# Patient Record
Sex: Male | Born: 1945 | Race: White | Hispanic: No | Smoking: Never smoker
Health system: Southern US, Community
[De-identification: ages and names within clinical notes are randomized; demographics above are authoritative.]

## PROBLEM LIST (undated history)

## (undated) DIAGNOSIS — R0600 Dyspnea, unspecified: Secondary | ICD-10-CM

## (undated) DIAGNOSIS — E785 Hyperlipidemia, unspecified: Secondary | ICD-10-CM

## (undated) DIAGNOSIS — F32A Depression, unspecified: Secondary | ICD-10-CM

## (undated) DIAGNOSIS — B0229 Other postherpetic nervous system involvement: Secondary | ICD-10-CM

## (undated) DIAGNOSIS — M5136 Other intervertebral disc degeneration, lumbar region: Secondary | ICD-10-CM

## (undated) DIAGNOSIS — R42 Dizziness and giddiness: Secondary | ICD-10-CM

## (undated) DIAGNOSIS — M1990 Unspecified osteoarthritis, unspecified site: Secondary | ICD-10-CM

## (undated) DIAGNOSIS — M51369 Other intervertebral disc degeneration, lumbar region without mention of lumbar back pain or lower extremity pain: Secondary | ICD-10-CM

## (undated) DIAGNOSIS — I2510 Atherosclerotic heart disease of native coronary artery without angina pectoris: Secondary | ICD-10-CM

## (undated) DIAGNOSIS — I10 Essential (primary) hypertension: Secondary | ICD-10-CM

## (undated) DIAGNOSIS — K219 Gastro-esophageal reflux disease without esophagitis: Secondary | ICD-10-CM

## (undated) HISTORY — PX: EYE SURGERY: SHX253

## (undated) HISTORY — PX: HERNIA REPAIR: SHX51

## (undated) HISTORY — PX: CATARACT EXTRACTION, BILATERAL: SHX1313

## (undated) HISTORY — PX: CARDIOVASCULAR STRESS TEST: SHX262

## (undated) HISTORY — PX: TONSILLECTOMY: SUR1361

## (undated) HISTORY — PX: ELBOW SURGERY: SHX618

---

## 2006-09-23 ENCOUNTER — Ambulatory Visit: Payer: Self-pay | Admitting: Family Medicine

## 2008-10-01 ENCOUNTER — Ambulatory Visit: Payer: Self-pay | Admitting: Otolaryngology

## 2011-04-25 ENCOUNTER — Ambulatory Visit: Payer: Self-pay | Admitting: Specialist

## 2011-05-17 ENCOUNTER — Ambulatory Visit: Payer: Self-pay | Admitting: Pain Medicine

## 2011-06-04 ENCOUNTER — Ambulatory Visit: Payer: Self-pay | Admitting: Pain Medicine

## 2011-06-07 ENCOUNTER — Ambulatory Visit: Payer: Self-pay | Admitting: Pain Medicine

## 2011-06-26 ENCOUNTER — Ambulatory Visit: Payer: Self-pay | Admitting: Pain Medicine

## 2011-06-28 ENCOUNTER — Ambulatory Visit: Payer: Self-pay | Admitting: Pain Medicine

## 2011-07-24 ENCOUNTER — Ambulatory Visit: Payer: Self-pay | Admitting: Pain Medicine

## 2011-07-26 ENCOUNTER — Ambulatory Visit: Payer: Self-pay | Admitting: Pain Medicine

## 2011-08-07 ENCOUNTER — Ambulatory Visit: Payer: Self-pay | Admitting: Pain Medicine

## 2011-08-23 ENCOUNTER — Ambulatory Visit: Payer: Self-pay | Admitting: Pain Medicine

## 2013-09-01 ENCOUNTER — Ambulatory Visit: Payer: Self-pay | Admitting: Family Medicine

## 2013-09-10 ENCOUNTER — Ambulatory Visit: Payer: Self-pay

## 2014-03-17 ENCOUNTER — Ambulatory Visit: Payer: Self-pay | Admitting: Specialist

## 2014-04-19 ENCOUNTER — Other Ambulatory Visit: Payer: Self-pay | Admitting: Neurosurgery

## 2014-04-19 DIAGNOSIS — M48061 Spinal stenosis, lumbar region without neurogenic claudication: Secondary | ICD-10-CM

## 2014-04-29 ENCOUNTER — Ambulatory Visit
Admission: RE | Admit: 2014-04-29 | Discharge: 2014-04-29 | Disposition: A | Discharge status: Home / Self Care | Payer: Medicare Other | Source: Ambulatory Visit | Attending: Neurosurgery | Admitting: Neurosurgery

## 2014-04-29 DIAGNOSIS — M48061 Spinal stenosis, lumbar region without neurogenic claudication: Secondary | ICD-10-CM

## 2014-04-29 MED ORDER — ONDANSETRON HCL 4 MG/2ML IJ SOLN
4.0000 mg | Freq: Once | INTRAMUSCULAR | Status: AC
  Administered 2014-04-29: 4 mg via INTRAMUSCULAR

## 2014-04-29 MED ORDER — MEPERIDINE HCL 100 MG/ML IJ SOLN
75.0000 mg | Freq: Once | INTRAMUSCULAR | Status: AC
  Administered 2014-04-29: 75 mg via INTRAMUSCULAR

## 2014-04-29 MED ORDER — IOHEXOL 180 MG/ML  SOLN
20.0000 mL | Freq: Once | INTRAMUSCULAR | Status: AC | PRN
  Administered 2014-04-29: 20 mL via INTRATHECAL

## 2014-04-29 MED ORDER — DIAZEPAM 5 MG PO TABS
5.0000 mg | ORAL_TABLET | Freq: Once | ORAL | Status: AC
  Administered 2014-04-29: 5 mg via ORAL

## 2014-04-29 NOTE — Progress Notes (Signed)
Pt has been off Tramadol for the past 2 days.

## 2014-04-29 NOTE — Discharge Instructions (Signed)
Myelogram Discharge Instructions  1. Go home and rest quietly for the next 24 hours.  It is important to lie flat for the next 24 hours.  Get up only to go to the restroom.  You may lie in the bed or on a couch on your back, your stomach, your left side or your right side.  You may have one pillow under your head.  You may have pillows between your knees while you are on your side or under your knees while you are on your back.  2. DO NOT drive today.  Recline the seat as far back as it will go, while still wearing your seat belt, on the way home.  3. You may get up to go to the bathroom as needed.  You may sit up for 10 minutes to eat.  You may resume your normal diet and medications unless otherwise indicated.  Drink plenty of extra fluids today and tomorrow.  4. The incidence of a spinal headache with nausea and/or vomiting is about 5% (one in 20 patients).  If you develop a headache, lie flat and drink plenty of fluids until the headache goes away.  Caffeinated beverages may be helpful.  If you develop severe nausea and vomiting or a headache that does not go away with flat bed rest, call [phone].  5. You may resume normal activities after your 24 hours of bed rest is over; however, do not exert yourself strongly or do any heavy lifting tomorrow.  6. Call your physician for a follow-up appointment.   You may resume Tramadol on Friday, April 30, 2014 after 2:00p.m.

## 2016-06-11 ENCOUNTER — Other Ambulatory Visit
Admission: RE | Admit: 2016-06-11 | Discharge: 2016-06-11 | Disposition: A | Discharge status: Home / Self Care | Payer: Medicare Other | Source: Ambulatory Visit | Attending: Orthopedic Surgery | Admitting: Orthopedic Surgery

## 2016-06-11 DIAGNOSIS — M25462 Effusion, left knee: Secondary | ICD-10-CM | POA: Insufficient documentation

## 2016-06-11 LAB — SYNOVIAL CELL COUNT + DIFF, W/ CRYSTALS
CRYSTALS FLUID: NONE SEEN
Eosinophils-Synovial: 1 %
Lymphocytes-Synovial Fld: 39 %
MONOCYTE-MACROPHAGE-SYNOVIAL FLUID: 54 %
Neutrophil, Synovial: 4 %
OTHER CELLS-SYN: 2
WBC, Synovial: 114 /mm3 (ref 0–200)

## 2016-06-15 LAB — BODY FLUID CULTURE: CULTURE: NO GROWTH

## 2016-07-03 ENCOUNTER — Other Ambulatory Visit: Payer: Medicare Other

## 2016-07-03 ENCOUNTER — Other Ambulatory Visit: Payer: Self-pay | Admitting: Orthopedic Surgery

## 2016-07-03 DIAGNOSIS — T1590XA Foreign body on external eye, part unspecified, unspecified eye, initial encounter: Secondary | ICD-10-CM

## 2016-07-03 DIAGNOSIS — M25462 Effusion, left knee: Secondary | ICD-10-CM

## 2016-07-12 ENCOUNTER — Ambulatory Visit: Payer: Medicare Other

## 2016-07-12 ENCOUNTER — Ambulatory Visit
Admission: RE | Admit: 2016-07-12 | Discharge: 2016-07-12 | Disposition: A | Discharge status: Home / Self Care | Payer: Medicare Other | Source: Ambulatory Visit | Attending: Orthopedic Surgery | Admitting: Orthopedic Surgery

## 2016-07-12 DIAGNOSIS — M84462A Pathological fracture, left tibia, initial encounter for fracture: Secondary | ICD-10-CM | POA: Diagnosis not present

## 2016-07-12 DIAGNOSIS — M25462 Effusion, left knee: Secondary | ICD-10-CM

## 2016-07-12 DIAGNOSIS — M1712 Unilateral primary osteoarthritis, left knee: Secondary | ICD-10-CM | POA: Diagnosis not present

## 2016-07-12 DIAGNOSIS — S83232A Complex tear of medial meniscus, current injury, left knee, initial encounter: Secondary | ICD-10-CM | POA: Diagnosis not present

## 2016-07-12 DIAGNOSIS — M948X6 Other specified disorders of cartilage, lower leg: Secondary | ICD-10-CM | POA: Insufficient documentation

## 2017-05-01 ENCOUNTER — Other Ambulatory Visit: Payer: Self-pay

## 2017-05-01 ENCOUNTER — Ambulatory Visit
Admission: EM | Admit: 2017-05-01 | Discharge: 2017-05-01 | Disposition: A | Discharge status: Home / Self Care | Payer: Medicare Other | Attending: Family Medicine | Admitting: Family Medicine

## 2017-05-01 DIAGNOSIS — R059 Cough, unspecified: Secondary | ICD-10-CM

## 2017-05-01 DIAGNOSIS — R05 Cough: Secondary | ICD-10-CM | POA: Diagnosis not present

## 2017-05-01 MED ORDER — DOXYCYCLINE HYCLATE 100 MG PO CAPS: 100.0000 mg | ORAL_CAPSULE | Freq: Two times a day (BID) | ORAL | 0 refills | Status: DC

## 2017-05-01 MED ORDER — PREDNISONE 50 MG PO TABS: ORAL_TABLET | ORAL | 0 refills | Status: DC

## 2017-05-01 MED ORDER — HYDROCOD POLST-CPM POLST ER 10-8 MG/5ML PO SUER: 5.0000 mL | Freq: Two times a day (BID) | ORAL | 0 refills | Status: DC | PRN

## 2017-05-01 NOTE — ED Triage Notes (Signed)
Patient complains of cough, congestion and sweats. Patient states that symptoms started over the weekend have been worsening.

## 2017-05-01 NOTE — ED Provider Notes (Signed)
[facility]    CSN: [id number] Arrival date & time: 05/01/17  1253  History   Chief Complaint Chief Complaint  Patient presents with  . Cough   HPI  72 year old male presents with cough.  Patient reports that he began to not feel well on Saturday.  He states that the day before he was cutting wood for several hours.  He reports that he has been experiencing productive cough and wheezing.  Worse at night.  He is used Sudafed, DayQuil, and other over-the-counter agents without improvement.  No fever.  He does endorse "sweats".  No reported sick contacts.  No known exacerbating factors.  No other associated symptoms.  No other complaints.  PMH:  HTN, HLD   Past Surgical History:  Procedure Laterality Date  . CATARACT EXTRACTION, BILATERAL     PR COLONOSCOPY W/BIOPSY SINGLE/MULTIPLE 11/14/2016 N/A Procedure: COLONOSCOPY, FLEXIBLE, PROXIMAL TO SPLENIC FLEXURE; WITH BIOPSY, SINGLE OR MULTIPLE; Surgeon: Maris Bergeraniel Aaron Kroch, MD; [facility]; Service: Gastroenterology    Home Medications    Prior to Admission medications   Medication Sig Start Date End Date Taking? Authorizing Provider  cetirizine (ZYRTEC) 10 MG tablet Take 10 mg by mouth.   Yes [provider]  lisinopril (PRINIVIL,ZESTRIL) 20 MG tablet lisinopril 20 mg tablet  TAKE 1 TABLET (20 MG TOTAL) BY MOUTH DAILY. 04/30/16  Yes [provider]  lovastatin (MEVACOR) 40 MG tablet lovastatin 40 mg tablet  TAKE 1 TABLET BY MOUTH EVERY DAY 08/24/09  Yes [provider]  LYCOPENE PO Take by mouth. 09/19/11  Yes [provider]  meloxicam (MOBIC) 15 MG tablet meloxicam 15 mg tablet  TAKE 1 TABLET BY MOUTH EVERY DAY 06/09/09  Yes [provider]  Omega-3 Fatty Acids (FISH OIL PO) Take by mouth. 12/27/10  Yes [provider]  chlorpheniramine-HYDROcodone (TUSSIONEX PENNKINETIC ER) 10-8 MG/5ML SUER Take 5 mLs by mouth every 12 (twelve) hours as needed.  05/01/17   Tommie Samsook, Jahniyah Revere G, DO  doxycycline (VIBRAMYCIN) 100 MG capsule Take 1 capsule (100 mg total) by mouth 2 (two) times daily. 05/01/17   Tommie Samsook, Noel Henandez G, DO  predniSONE (DELTASONE) 50 MG tablet 1 tablet daily x 5 days. 05/01/17   Tommie Samsook, Mercadez Heitman G, DO    Family History Family History  Problem Relation Age of Onset  . CVA Mother   . Brain cancer Mother   . Heart attack Father 6838  . Diabetes Brother 8938    Social History Social History   Tobacco Use  . Smoking status: Never Smoker  . Smokeless tobacco: Current User    Types: Snuff  Substance Use Topics  . Alcohol use: Never    Alcohol/week: 0.0 oz    Frequency: Never  . Drug use: Never    Allergies   Patient has no known allergies.   Review of Systems Review of Systems  Constitutional: Positive for fatigue. Negative for fever.  Respiratory: Positive for cough.    Physical Exam Triage Vital Signs ED Triage Vitals  Enc Vitals Group     BP 05/01/17 1311 (!) 155/97     Pulse Rate 05/01/17 1311 77     Resp 05/01/17 1311 18     Temp 05/01/17 1311 98.5 F (36.9 C)     Temp Source 05/01/17 1311 Oral     SpO2 05/01/17 1311 100 %     Weight 05/01/17 1307 210 lb (95.3 kg)     Height 05/01/17 1307 6' (1.829 m)  Head Circumference --      Peak Flow --      Pain Score 05/01/17 1307 9     Pain Loc --      Pain Edu? --      Excl. in GC? --    Updated Vital Signs BP (!) 155/97 (BP Location: Right Arm)   Pulse 77   Temp 98.5 F (36.9 C) (Oral)   Resp 18   Ht 6' (1.829 m)   Wt 210 lb (95.3 kg)   SpO2 100%   BMI 28.48 kg/m   Physical Exam  Constitutional: He is oriented to person, place, and time. He appears well-developed. No distress.  HENT:  Head: Normocephalic and atraumatic.  Mouth/Throat: Oropharynx is clear and moist.  Cardiovascular: Normal rate and regular rhythm.  Pulmonary/Chest: Effort normal and breath sounds normal. He has no wheezes. He has no rales.  Neurological: He is alert and oriented to person,  place, and time.  Psychiatric: He has a normal mood and affect. His behavior is normal.  Nursing note and vitals reviewed.  UC Treatments / Results  Labs (all labs ordered are listed, but only abnormal results are displayed) Labs Reviewed - No data to display  EKG None Radiology No results found.  Procedures Procedures (including critical care time)  Medications Ordered in UC Medications - No data to display   Initial Impression / Assessment and Plan / UC Course  I have reviewed the triage vital signs and the nursing notes.  Pertinent labs & imaging results that were available during my care of the patient were reviewed by me and considered in my medical decision making (see chart for details).     72 year old male presents with cough.  Treating with prednisone and Tussionex.  Doxycycline if he fails to improve or worsens.  Final Clinical Impressions(s) / UC Diagnoses   Final diagnoses:  Cough    ED Discharge Orders        Ordered    predniSONE (DELTASONE) 50 MG tablet     05/01/17 1323    chlorpheniramine-HYDROcodone (TUSSIONEX PENNKINETIC ER) 10-8 MG/5ML SUER  Every 12 hours PRN     05/01/17 1323    doxycycline (VIBRAMYCIN) 100 MG capsule  2 times daily     05/01/17 1323     Controlled Substance Prescriptions [place] Controlled Substance Registry consulted? Not Applicable   Tommie Sams, DO 05/01/17 1342

## 2017-05-01 NOTE — Discharge Instructions (Signed)
Prednisone and cough medication as prescribed.  If you fail to improve, you can start the antibiotic.  Take care  Dr. Adriana Simasook

## 2017-06-20 ENCOUNTER — Other Ambulatory Visit
Admission: RE | Admit: 2017-06-20 | Discharge: 2017-06-20 | Disposition: A | Discharge status: Home / Self Care | Payer: Medicare Other | Source: Ambulatory Visit | Attending: Orthopedic Surgery | Admitting: Orthopedic Surgery

## 2017-06-20 DIAGNOSIS — M25462 Effusion, left knee: Secondary | ICD-10-CM | POA: Insufficient documentation

## 2017-06-20 LAB — SYNOVIAL CELL COUNT + DIFF, W/ CRYSTALS
Crystals, Fluid: NONE SEEN
EOSINOPHILS-SYNOVIAL: 0 %
LYMPHOCYTES-SYNOVIAL FLD: 34 %
MONOCYTE-MACROPHAGE-SYNOVIAL FLUID: 59 %
NEUTROPHIL, SYNOVIAL: 7 %
Other Cells-SYN: 0
WBC, SYNOVIAL: 1498 /mm3 — AB (ref 0–200)

## 2017-06-25 LAB — BODY FLUID CULTURE
CULTURE: NO GROWTH
Gram Stain: NONE SEEN

## 2017-08-28 ENCOUNTER — Other Ambulatory Visit: Payer: Self-pay | Admitting: Orthopedic Surgery

## 2017-10-01 ENCOUNTER — Encounter
Admission: RE | Admit: 2017-10-01 | Discharge: 2017-10-01 | Disposition: A | Discharge status: Home / Self Care | Payer: Medicare Other | Source: Ambulatory Visit | Attending: Orthopedic Surgery | Admitting: Orthopedic Surgery

## 2017-10-01 ENCOUNTER — Other Ambulatory Visit: Payer: Self-pay

## 2017-10-01 ENCOUNTER — Ambulatory Visit
Admission: RE | Admit: 2017-10-01 | Discharge: 2017-10-01 | Disposition: A | Discharge status: Home / Self Care | Payer: Medicare Other | Source: Ambulatory Visit | Attending: Orthopedic Surgery | Admitting: Orthopedic Surgery

## 2017-10-01 DIAGNOSIS — Z01811 Encounter for preprocedural respiratory examination: Secondary | ICD-10-CM

## 2017-10-01 DIAGNOSIS — Z01818 Encounter for other preprocedural examination: Secondary | ICD-10-CM | POA: Diagnosis present

## 2017-10-01 DIAGNOSIS — Z01812 Encounter for preprocedural laboratory examination: Secondary | ICD-10-CM | POA: Diagnosis not present

## 2017-10-01 DIAGNOSIS — Z01810 Encounter for preprocedural cardiovascular examination: Secondary | ICD-10-CM | POA: Insufficient documentation

## 2017-10-01 HISTORY — DX: Other intervertebral disc degeneration, lumbar region: M51.36

## 2017-10-01 HISTORY — DX: Dizziness and giddiness: R42

## 2017-10-01 HISTORY — DX: Essential (primary) hypertension: I10

## 2017-10-01 HISTORY — DX: Unspecified osteoarthritis, unspecified site: M19.90

## 2017-10-01 HISTORY — DX: Other intervertebral disc degeneration, lumbar region without mention of lumbar back pain or lower extremity pain: M51.369

## 2017-10-01 LAB — BASIC METABOLIC PANEL
ANION GAP: 9 (ref 5–15)
BUN: 11 mg/dL (ref 8–23)
CHLORIDE: 94 mmol/L — AB (ref 98–111)
CO2: 27 mmol/L (ref 22–32)
Calcium: 9.2 mg/dL (ref 8.9–10.3)
Creatinine, Ser: 0.97 mg/dL (ref 0.61–1.24)
Glucose, Bld: 105 mg/dL — ABNORMAL HIGH (ref 70–99)
POTASSIUM: 3.4 mmol/L — AB (ref 3.5–5.1)
SODIUM: 130 mmol/L — AB (ref 135–145)

## 2017-10-01 LAB — HEMOGLOBIN A1C
HEMOGLOBIN A1C: 5.5 % (ref 4.8–5.6)
Mean Plasma Glucose: 111.15 mg/dL

## 2017-10-01 LAB — URINALYSIS, ROUTINE W REFLEX MICROSCOPIC
Bilirubin Urine: NEGATIVE
GLUCOSE, UA: NEGATIVE mg/dL
HGB URINE DIPSTICK: NEGATIVE
KETONES UR: NEGATIVE mg/dL
LEUKOCYTES UA: NEGATIVE
Nitrite: NEGATIVE
PROTEIN: NEGATIVE mg/dL
Specific Gravity, Urine: 1.008 (ref 1.005–1.030)
pH: 8 (ref 5.0–8.0)

## 2017-10-01 LAB — CBC WITH DIFFERENTIAL/PLATELET
BASOS ABS: 0 10*3/uL (ref 0–0.1)
Basophils Relative: 1 %
EOS PCT: 2 %
Eosinophils Absolute: 0.1 10*3/uL (ref 0–0.7)
HCT: 40.5 % (ref 40.0–52.0)
HEMOGLOBIN: 14.3 g/dL (ref 13.0–18.0)
LYMPHS PCT: 39 %
Lymphs Abs: 1.9 10*3/uL (ref 1.0–3.6)
MCH: 33 pg (ref 26.0–34.0)
MCHC: 35.3 g/dL (ref 32.0–36.0)
MCV: 93.6 fL (ref 80.0–100.0)
Monocytes Absolute: 0.6 10*3/uL (ref 0.2–1.0)
Monocytes Relative: 11 %
NEUTROS ABS: 2.3 10*3/uL (ref 1.4–6.5)
NEUTROS PCT: 47 %
PLATELETS: 226 10*3/uL (ref 150–440)
RBC: 4.32 MIL/uL — AB (ref 4.40–5.90)
RDW: 13 % (ref 11.5–14.5)
WBC: 4.9 10*3/uL (ref 3.8–10.6)

## 2017-10-01 LAB — PROTIME-INR
INR: 0.89
PROTHROMBIN TIME: 12 s (ref 11.4–15.2)

## 2017-10-01 LAB — SURGICAL PCR SCREEN
MRSA, PCR: NEGATIVE
Staphylococcus aureus: NEGATIVE

## 2017-10-01 LAB — TYPE AND SCREEN
ABO/RH(D): A POS
Antibody Screen: NEGATIVE

## 2017-10-01 LAB — APTT: APTT: 27 s (ref 24–36)

## 2017-10-01 MED ORDER — CHLORHEXIDINE GLUCONATE CLOTH 2 % EX PADS
6.0000 | MEDICATED_PAD | Freq: Once | CUTANEOUS | Status: DC
  Filled 2017-10-01: qty 6

## 2017-10-01 NOTE — Pre-Procedure Instructions (Signed)
REQUEST FOR CARDIAC CLEARANCE CALLED AND FAXED TO DR Kathaleen Grinder JONAS, PCP TO ARRANGE CARDIOLOGY CLEARANCE. HAS TO LM FOR TERESA. ALSO FAXED FYI TO DR Martha ClanKRASINSKI

## 2017-10-01 NOTE — Patient Instructions (Signed)
Your procedure is scheduled on: 10/15/17 Tues Report to Same Day Surgery 2nd floor medical mall [facility] Entrance-take elevator on left to 2nd floor.  Check in with surgery information desk.) To find out your arrival time please call [phone] between 1PM - 3PM on 10/14/17 Mon  Remember: Instructions that are not followed completely may result in serious medical risk, up to and including death, or upon the discretion of your surgeon and anesthesiologist your surgery may need to be rescheduled.    _x___ 1. Do not eat food after midnight the night before your procedure. You may drink clear liquids up to 2 hours before you are scheduled to arrive at the hospital for your procedure.  Do not drink clear liquids within 2 hours of your scheduled arrival to the hospital.  Clear liquids include  --Water or Apple juice without pulp  --Clear carbohydrate beverage such as ClearFast or Gatorade  --Black Coffee or Clear Tea (No milk, no creamers, do not add anything to                  the coffee or Tea Type 1 and type 2 diabetics should only drink water.   ____Ensure clear carbohydrate drink on the way to the hospital for bariatric patients  ____Ensure clear carbohydrate drink 3 hours before surgery for Dr Dwyane Luo patients if physician instructed.   No gum chewing or hard candies.     __x__ 2. No Alcohol for 24 hours before or after surgery.   __x__3. No Smoking or e-cigarettes for 24 prior to surgery.  Do not use any chewable tobacco products for at least 6 hour prior to surgery   ____  4. Bring all medications with you on the day of surgery if instructed.    __x__ 5. Notify your doctor if there is any change in your medical condition     (cold, fever, infections).    x___6. On the morning of surgery brush your teeth with toothpaste and water.  You may rinse your mouth with mouth wash if you wish.  Do not swallow any toothpaste or mouthwash.   Do not wear jewelry, make-up, hairpins,  clips or nail polish.  Do not wear lotions, powders, or perfumes. You may wear deodorant.  Do not shave 48 hours prior to surgery. Men may shave face and neck.  Do not bring valuables to the hospital.    [facility] is not responsible for any belongings or valuables.               Contacts, dentures or bridgework may not be worn into surgery.  Leave your suitcase in the car. After surgery it may be brought to your room.  For patients admitted to the hospital, discharge time is determined by your                       treatment team.  _  Patients discharged the day of surgery will not be allowed to drive home.  You will need someone to drive you home and stay with you the night of your procedure.    Please read over the following fact sheets that you were given:   [facility] Preparing for Surgery and or MRSA Information   _x___ Take anti-hypertensive listed below, cardiac, seizure, asthma,     anti-reflux and psychiatric medicines. These include:  1. None  2.  3.  4.  5.  6.  ____Fleets enema or Magnesium Citrate as directed.  _x___ Use CHG Soap or sage wipes as directed on instruction sheet   ____ Use inhalers on the day of surgery and bring to hospital day of surgery  ____ Stop Metformin and Janumet 2 days prior to surgery.    ____ Take 1/2 of usual insulin dose the night before surgery and none on the morning     surgery.   _x___ Follow recommendations from Cardiologist, Pulmonologist or PCP regarding          stopping Aspirin, Coumadin, Plavix ,Eliquis, Effient, or Pradaxa, and Pletal. Stop Mobic ( meloxicam) 1 week before surgery.  X____Stop Anti-inflammatories such as Advil, Aleve, Ibuprofen, Motrin, Naproxen, Naprosyn, Goodies powders or aspirin products. OK to take Tylenol and                          Celebrex.   _x___ Stop supplements until after surgery.  But may continue Vitamin D, Vitamin B,       and multivitamin. Stop fish oil 1 week before surgery.  ____ Bring  C-Pap to the hospital.

## 2017-10-14 ENCOUNTER — Encounter: Payer: Self-pay | Admitting: Anesthesiology

## 2017-10-14 MED ORDER — CEFAZOLIN SODIUM-DEXTROSE 2-4 GM/100ML-% IV SOLN
2.0000 g | INTRAVENOUS | Status: AC
  Administered 2017-10-15: 2 g via INTRAVENOUS

## 2017-10-14 MED ORDER — CLINDAMYCIN PHOSPHATE 900 MG/50ML IV SOLN
900.0000 mg | Freq: Once | INTRAVENOUS | Status: AC
  Administered 2017-10-15: 900 mg via INTRAVENOUS

## 2017-10-14 NOTE — Pre-Procedure Instructions (Signed)
RECEIVED CLEARANCE WITH NEGATIVE STRESS / ECHO, LOW RISK

## 2017-10-15 ENCOUNTER — Ambulatory Visit: Payer: Medicare Other | Admitting: Anesthesiology

## 2017-10-15 ENCOUNTER — Inpatient Hospital Stay: Payer: Medicare Other

## 2017-10-15 ENCOUNTER — Observation Stay
Admission: AD | Admit: 2017-10-15 | Discharge: 2017-10-18 | Disposition: A | Discharge status: Home / Self Care | Payer: Medicare Other | Source: Ambulatory Visit | Attending: Orthopedic Surgery | Admitting: Orthopedic Surgery

## 2017-10-15 ENCOUNTER — Other Ambulatory Visit: Payer: Self-pay

## 2017-10-15 ENCOUNTER — Encounter
Admission: AD | Disposition: A | Discharge status: Home / Self Care | Payer: Self-pay | Source: Ambulatory Visit | Attending: Orthopedic Surgery

## 2017-10-15 ENCOUNTER — Encounter: Payer: Self-pay | Admitting: *Deleted

## 2017-10-15 DIAGNOSIS — M5136 Other intervertebral disc degeneration, lumbar region: Secondary | ICD-10-CM | POA: Diagnosis not present

## 2017-10-15 DIAGNOSIS — Z79899 Other long term (current) drug therapy: Secondary | ICD-10-CM | POA: Insufficient documentation

## 2017-10-15 DIAGNOSIS — M1712 Unilateral primary osteoarthritis, left knee: Principal | ICD-10-CM | POA: Insufficient documentation

## 2017-10-15 DIAGNOSIS — Z96652 Presence of left artificial knee joint: Secondary | ICD-10-CM

## 2017-10-15 DIAGNOSIS — I10 Essential (primary) hypertension: Secondary | ICD-10-CM | POA: Diagnosis not present

## 2017-10-15 DIAGNOSIS — K219 Gastro-esophageal reflux disease without esophagitis: Secondary | ICD-10-CM | POA: Insufficient documentation

## 2017-10-15 HISTORY — PX: TOTAL KNEE ARTHROPLASTY: SHX125

## 2017-10-15 LAB — ABO/RH: ABO/RH(D): A POS

## 2017-10-15 SURGERY — ARTHROPLASTY, KNEE, TOTAL
Anesthesia: General | Site: Knee | Laterality: Left | Wound class: Clean

## 2017-10-15 MED ORDER — METHOCARBAMOL 500 MG PO TABS
500.0000 mg | ORAL_TABLET | Freq: Four times a day (QID) | ORAL | Status: DC | PRN
  Administered 2017-10-16 – 2017-10-17 (×2): 500 mg via ORAL
  Filled 2017-10-15 (×2): qty 1

## 2017-10-15 MED ORDER — BUPIVACAINE HCL (PF) 0.5 % IJ SOLN
INTRAMUSCULAR | Status: AC
  Filled 2017-10-15: qty 10

## 2017-10-15 MED ORDER — METHOCARBAMOL 1000 MG/10ML IJ SOLN
500.0000 mg | Freq: Four times a day (QID) | INTRAVENOUS | Status: DC | PRN
  Filled 2017-10-15: qty 5

## 2017-10-15 MED ORDER — CEFAZOLIN SODIUM-DEXTROSE 2-4 GM/100ML-% IV SOLN
INTRAVENOUS | Status: AC
  Filled 2017-10-15: qty 100

## 2017-10-15 MED ORDER — PHENOL 1.4 % MT LIQD
1.0000 | OROMUCOSAL | Status: DC | PRN
  Filled 2017-10-15: qty 177

## 2017-10-15 MED ORDER — GLYCERIN-HYPROMELLOSE-PEG 400 0.2-0.2-1 % OP SOLN: 1.0000 [drp] | Freq: Every day | OPHTHALMIC | Status: DC | PRN

## 2017-10-15 MED ORDER — MENTHOL 3 MG MT LOZG
1.0000 | LOZENGE | OROMUCOSAL | Status: DC | PRN
  Filled 2017-10-15: qty 9

## 2017-10-15 MED ORDER — KETOROLAC TROMETHAMINE 30 MG/ML IJ SOLN
INTRAMUSCULAR | Status: DC | PRN
  Administered 2017-10-15: 30 mg

## 2017-10-15 MED ORDER — LACTATED RINGERS IV SOLN
INTRAVENOUS | Status: DC
  Administered 2017-10-15 (×2): via INTRAVENOUS

## 2017-10-15 MED ORDER — DIPHENHYDRAMINE HCL 12.5 MG/5ML PO ELIX: 12.5000 mg | ORAL_SOLUTION | ORAL | Status: DC | PRN

## 2017-10-15 MED ORDER — FENTANYL CITRATE (PF) 100 MCG/2ML IJ SOLN: 25.0000 ug | INTRAMUSCULAR | Status: DC | PRN

## 2017-10-15 MED ORDER — HYDROCHLOROTHIAZIDE 25 MG PO TABS
25.0000 mg | ORAL_TABLET | Freq: Every day | ORAL | Status: DC
  Administered 2017-10-15 – 2017-10-18 (×3): 25 mg via ORAL
  Filled 2017-10-15 (×3): qty 1

## 2017-10-15 MED ORDER — CLINDAMYCIN PHOSPHATE 900 MG/50ML IV SOLN
INTRAVENOUS | Status: AC
  Filled 2017-10-15: qty 50

## 2017-10-15 MED ORDER — GABAPENTIN 300 MG PO CAPS
300.0000 mg | ORAL_CAPSULE | Freq: Three times a day (TID) | ORAL | Status: DC
  Administered 2017-10-15 – 2017-10-18 (×10): 300 mg via ORAL
  Filled 2017-10-15 (×10): qty 1

## 2017-10-15 MED ORDER — MORPHINE SULFATE 4 MG/ML IJ SOLN
INTRAMUSCULAR | Status: DC | PRN
  Administered 2017-10-15: 4 mg

## 2017-10-15 MED ORDER — POLYVINYL ALCOHOL 1.4 % OP SOLN
1.0000 [drp] | OPHTHALMIC | Status: DC | PRN
  Filled 2017-10-15: qty 15

## 2017-10-15 MED ORDER — PROPOFOL 500 MG/50ML IV EMUL
INTRAVENOUS | Status: DC | PRN
  Administered 2017-10-15: 75 ug/kg/min via INTRAVENOUS

## 2017-10-15 MED ORDER — HYDROMORPHONE HCL 1 MG/ML IJ SOLN
0.5000 mg | INTRAMUSCULAR | Status: DC | PRN
  Administered 2017-10-15: 0.5 mg via INTRAVENOUS
  Filled 2017-10-15: qty 1

## 2017-10-15 MED ORDER — BUPIVACAINE LIPOSOME 1.3 % IJ SUSP
INTRAMUSCULAR | Status: AC
  Filled 2017-10-15: qty 20

## 2017-10-15 MED ORDER — TRAMADOL HCL 50 MG PO TABS
50.0000 mg | ORAL_TABLET | Freq: Four times a day (QID) | ORAL | Status: DC
  Administered 2017-10-15 – 2017-10-18 (×10): 50 mg via ORAL
  Filled 2017-10-15 (×12): qty 1

## 2017-10-15 MED ORDER — PROPOFOL 500 MG/50ML IV EMUL
INTRAVENOUS | Status: AC
  Filled 2017-10-15: qty 50

## 2017-10-15 MED ORDER — LORATADINE 10 MG PO TABS
10.0000 mg | ORAL_TABLET | Freq: Every day | ORAL | Status: DC
  Administered 2017-10-16 – 2017-10-18 (×3): 10 mg via ORAL
  Filled 2017-10-15 (×4): qty 1

## 2017-10-15 MED ORDER — MORPHINE SULFATE (PF) 4 MG/ML IV SOLN
INTRAVENOUS | Status: AC
  Filled 2017-10-15: qty 1

## 2017-10-15 MED ORDER — MIDAZOLAM HCL 2 MG/2ML IJ SOLN
INTRAMUSCULAR | Status: AC
  Filled 2017-10-15: qty 2

## 2017-10-15 MED ORDER — DOCUSATE SODIUM 100 MG PO CAPS
100.0000 mg | ORAL_CAPSULE | Freq: Two times a day (BID) | ORAL | Status: DC
  Administered 2017-10-15 – 2017-10-18 (×6): 100 mg via ORAL
  Filled 2017-10-15 (×6): qty 1

## 2017-10-15 MED ORDER — BUPIVACAINE HCL (PF) 0.5 % IJ SOLN
INTRAMUSCULAR | Status: DC | PRN
  Administered 2017-10-15: 3 mL

## 2017-10-15 MED ORDER — BISACODYL 5 MG PO TBEC
5.0000 mg | DELAYED_RELEASE_TABLET | Freq: Every day | ORAL | Status: DC | PRN
  Administered 2017-10-17: 5 mg via ORAL
  Filled 2017-10-15: qty 1

## 2017-10-15 MED ORDER — NEOMYCIN-POLYMYXIN B GU 40-200000 IR SOLN
Status: AC
  Filled 2017-10-15: qty 20

## 2017-10-15 MED ORDER — NEOMYCIN-POLYMYXIN B GU 40-200000 IR SOLN
Status: DC | PRN
  Administered 2017-10-15: 16 mL

## 2017-10-15 MED ORDER — FAMOTIDINE 20 MG PO TABS
ORAL_TABLET | ORAL | Status: AC
  Filled 2017-10-15: qty 1

## 2017-10-15 MED ORDER — PRAVASTATIN SODIUM 20 MG PO TABS
40.0000 mg | ORAL_TABLET | Freq: Every day | ORAL | Status: DC
  Administered 2017-10-15 – 2017-10-17 (×3): 40 mg via ORAL
  Filled 2017-10-15 (×3): qty 2

## 2017-10-15 MED ORDER — BUPIVACAINE-EPINEPHRINE (PF) 0.25% -1:200000 IJ SOLN
INTRAMUSCULAR | Status: DC | PRN
  Administered 2017-10-15: 30 mL

## 2017-10-15 MED ORDER — ONDANSETRON HCL 4 MG PO TABS: 4.0000 mg | ORAL_TABLET | Freq: Four times a day (QID) | ORAL | Status: DC | PRN

## 2017-10-15 MED ORDER — SODIUM CHLORIDE 0.9 % IV SOLN
INTRAVENOUS | Status: DC
  Administered 2017-10-15 – 2017-10-17 (×4): via INTRAVENOUS

## 2017-10-15 MED ORDER — ONDANSETRON HCL 4 MG/2ML IJ SOLN
4.0000 mg | Freq: Four times a day (QID) | INTRAMUSCULAR | Status: DC | PRN
  Administered 2017-10-17: 4 mg via INTRAVENOUS
  Filled 2017-10-15: qty 2

## 2017-10-15 MED ORDER — MIDAZOLAM HCL 5 MG/5ML IJ SOLN
INTRAMUSCULAR | Status: DC | PRN
  Administered 2017-10-15: 2 mg via INTRAVENOUS

## 2017-10-15 MED ORDER — SODIUM CHLORIDE FLUSH 0.9 % IV SOLN
INTRAVENOUS | Status: AC
  Filled 2017-10-15: qty 40

## 2017-10-15 MED ORDER — ENOXAPARIN SODIUM 30 MG/0.3ML ~~LOC~~ SOLN
30.0000 mg | Freq: Two times a day (BID) | SUBCUTANEOUS | Status: DC
  Administered 2017-10-16 – 2017-10-18 (×5): 30 mg via SUBCUTANEOUS
  Filled 2017-10-15 (×5): qty 0.3

## 2017-10-15 MED ORDER — SODIUM CHLORIDE 0.9 % IV SOLN
INTRAVENOUS | Status: DC | PRN
  Administered 2017-10-15: 60 mL

## 2017-10-15 MED ORDER — SODIUM CHLORIDE 0.9 % IV SOLN
INTRAVENOUS | Status: DC | PRN
  Administered 2017-10-15: 20 ug/min via INTRAVENOUS

## 2017-10-15 MED ORDER — FAMOTIDINE 20 MG PO TABS
20.0000 mg | ORAL_TABLET | Freq: Once | ORAL | Status: AC
  Administered 2017-10-15: 20 mg via ORAL

## 2017-10-15 MED ORDER — ACETAMINOPHEN 325 MG PO TABS
325.0000 mg | ORAL_TABLET | Freq: Four times a day (QID) | ORAL | Status: DC | PRN
  Administered 2017-10-16 – 2017-10-17 (×4): 650 mg via ORAL
  Filled 2017-10-15 (×4): qty 2

## 2017-10-15 MED ORDER — OXYCODONE HCL 5 MG PO TABS
5.0000 mg | ORAL_TABLET | ORAL | Status: DC | PRN
  Administered 2017-10-15: 5 mg via ORAL
  Administered 2017-10-16 (×2): 10 mg via ORAL
  Administered 2017-10-16 – 2017-10-17 (×2): 5 mg via ORAL
  Filled 2017-10-15 (×2): qty 2
  Filled 2017-10-15 (×2): qty 1
  Filled 2017-10-15 (×3): qty 2
  Filled 2017-10-15: qty 1
  Filled 2017-10-15: qty 2

## 2017-10-15 MED ORDER — ACETAMINOPHEN 500 MG PO TABS
1000.0000 mg | ORAL_TABLET | Freq: Four times a day (QID) | ORAL | Status: AC
  Administered 2017-10-15 – 2017-10-16 (×4): 1000 mg via ORAL
  Filled 2017-10-15 (×4): qty 2

## 2017-10-15 MED ORDER — OXYCODONE HCL 5 MG PO TABS
10.0000 mg | ORAL_TABLET | ORAL | Status: DC | PRN
  Administered 2017-10-15 – 2017-10-18 (×5): 10 mg via ORAL
  Filled 2017-10-15: qty 2

## 2017-10-15 MED ORDER — SENNOSIDES-DOCUSATE SODIUM 8.6-50 MG PO TABS: 1.0000 | ORAL_TABLET | Freq: Every evening | ORAL | Status: DC | PRN

## 2017-10-15 MED ORDER — BUPIVACAINE-EPINEPHRINE (PF) 0.25% -1:200000 IJ SOLN
INTRAMUSCULAR | Status: AC
  Filled 2017-10-15: qty 60

## 2017-10-15 MED ORDER — LISINOPRIL 20 MG PO TABS
20.0000 mg | ORAL_TABLET | Freq: Every day | ORAL | Status: DC
  Administered 2017-10-15 – 2017-10-18 (×3): 20 mg via ORAL
  Filled 2017-10-15 (×3): qty 1

## 2017-10-15 MED ORDER — CEFAZOLIN SODIUM-DEXTROSE 1-4 GM/50ML-% IV SOLN
1.0000 g | Freq: Four times a day (QID) | INTRAVENOUS | Status: AC
  Administered 2017-10-15 (×2): 1 g via INTRAVENOUS
  Filled 2017-10-15 (×2): qty 50

## 2017-10-15 SURGICAL SUPPLY — 65 items
BLADE SAW 90X13X1.19 OSCILLAT (BLADE) ×2 IMPLANT
BLADE SAW 90X25X1.19 OSCILLAT (BLADE) ×2 IMPLANT
CANISTER SUCT 1200ML W/VALVE (MISCELLANEOUS) ×2 IMPLANT
CANISTER SUCT 3000ML PPV (MISCELLANEOUS) ×4 IMPLANT
CEMENT HV SMART SET (Cement) ×4 IMPLANT
CEMENT TIBIA MBT SIZE 5 (Knees) ×1 IMPLANT
CNTNR SPEC 2.5X3XGRAD LEK (MISCELLANEOUS) ×1
CONT SPEC 4OZ STER OR WHT (MISCELLANEOUS) ×1
CONTAINER SPEC 2.5X3XGRAD LEK (MISCELLANEOUS) ×1 IMPLANT
COOLER POLAR GLACIER W/PUMP (MISCELLANEOUS) ×2 IMPLANT
CUFF TOURN 24 STER (MISCELLANEOUS) IMPLANT
CUFF TOURN 30 STER DUAL PORT (MISCELLANEOUS) ×2 IMPLANT
DRAPE IMP U-DRAPE 54X76 (DRAPES) ×2 IMPLANT
DRAPE INCISE IOBAN 66X60 STRL (DRAPES) ×2 IMPLANT
DRAPE SHEET LG 3/4 BI-LAMINATE (DRAPES) ×4 IMPLANT
DRAPE SURG 17X11 SM STRL (DRAPES) ×4 IMPLANT
DRSG OPSITE POSTOP 4X12 (GAUZE/BANDAGES/DRESSINGS) ×2 IMPLANT
DRSG OPSITE POSTOP 4X14 (GAUZE/BANDAGES/DRESSINGS) ×2 IMPLANT
DURAPREP 26ML APPLICATOR (WOUND CARE) ×8 IMPLANT
ELECT REM PT RETURN 9FT ADLT (ELECTROSURGICAL) ×2
ELECTRODE REM PT RTRN 9FT ADLT (ELECTROSURGICAL) ×1 IMPLANT
FEMUR SIGMA PS SZ 5.0 L (Femur) ×2 IMPLANT
GAUZE SPONGE 4X4 12PLY STRL (GAUZE/BANDAGES/DRESSINGS) ×2 IMPLANT
GLOVE BIOGEL PI IND STRL 7.5 (GLOVE) ×4 IMPLANT
GLOVE BIOGEL PI IND STRL 9 (GLOVE) ×1 IMPLANT
GLOVE BIOGEL PI INDICATOR 7.5 (GLOVE) ×4
GLOVE BIOGEL PI INDICATOR 9 (GLOVE) ×1
GLOVE SURG 9.0 ORTHO LTXF (GLOVE) ×4 IMPLANT
GOWN STRL REUS TWL 2XL XL LVL4 (GOWN DISPOSABLE) ×2 IMPLANT
GOWN STRL REUS W/ TWL LRG LVL3 (GOWN DISPOSABLE) ×3 IMPLANT
GOWN STRL REUS W/ TWL LRG LVL4 (GOWN DISPOSABLE) ×1 IMPLANT
GOWN STRL REUS W/TWL LRG LVL3 (GOWN DISPOSABLE) ×3
GOWN STRL REUS W/TWL LRG LVL4 (GOWN DISPOSABLE) ×1
HOLDER FOLEY CATH W/STRAP (MISCELLANEOUS) ×2 IMPLANT
IMMBOLIZER KNEE 19 BLUE UNIV (SOFTGOODS) ×2 IMPLANT
KIT TURNOVER KIT A (KITS) ×2 IMPLANT
NDL SAFETY ECLIPSE 18X1.5 (NEEDLE) ×1 IMPLANT
NEEDLE HYPO 18GX1.5 SHARP (NEEDLE) ×1
NEEDLE HYPO 22GX1.5 SAFETY (NEEDLE) ×2 IMPLANT
NEEDLE SPNL 20GX3.5 QUINCKE YW (NEEDLE) ×2 IMPLANT
NS IRRIG 1000ML POUR BTL (IV SOLUTION) ×2 IMPLANT
PACK TOTAL KNEE (MISCELLANEOUS) ×2 IMPLANT
PAD PREP 24X41 OB/GYN DISP (PERSONAL CARE ITEMS) ×2 IMPLANT
PAD WRAPON POLAR KNEE (MISCELLANEOUS) ×1 IMPLANT
PATELLA DOME PFC 38MM (Knees) ×2 IMPLANT
PLATE ROT INSERT 10MM SIZE 5 (Plate) ×2 IMPLANT
PULSAVAC PLUS IRRIG FAN TIP (DISPOSABLE) ×2
SOL .9 NS 3000ML IRR  AL (IV SOLUTION) ×1
SOL .9 NS 3000ML IRR UROMATIC (IV SOLUTION) ×1 IMPLANT
SPONGE DRAIN TRACH 4X4 STRL 2S (GAUZE/BANDAGES/DRESSINGS) ×2 IMPLANT
SPONGE LAP 18X18 RF (DISPOSABLE) IMPLANT
STAPLER SKIN PROX 35W (STAPLE) ×2 IMPLANT
SUCTION FRAZIER HANDLE 10FR (MISCELLANEOUS) ×1
SUCTION TUBE FRAZIER 10FR DISP (MISCELLANEOUS) ×1 IMPLANT
SUT ETHIBOND NAB CT1 #1 30IN (SUTURE) ×6 IMPLANT
SUT VIC AB 0 CT1 36 (SUTURE) ×2 IMPLANT
SUT VIC AB 2-0 CT1 (SUTURE) ×4 IMPLANT
SYR 20CC LL (SYRINGE) ×2 IMPLANT
SYR 30ML LL (SYRINGE) ×4 IMPLANT
TIBIA MBT CEMENT SIZE 5 (Knees) ×2 IMPLANT
TIP FAN IRRIG PULSAVAC PLUS (DISPOSABLE) ×1 IMPLANT
TOWER CARTRIDGE SMART MIX (DISPOSABLE) ×2 IMPLANT
TRAY FOLEY MTR SLVR 16FR STAT (SET/KITS/TRAYS/PACK) ×2 IMPLANT
TUBE SUCT KAM VAC (TUBING) ×2 IMPLANT
WRAPON POLAR PAD KNEE (MISCELLANEOUS) ×2

## 2017-10-15 NOTE — Addendum Note (Signed)
Addendum  created 10/15/17 1433 by Oliva Bustard, CRNA   Child order released for a procedure order, Intraprocedure Blocks edited, Sign clinical note

## 2017-10-15 NOTE — Progress Notes (Signed)
Notified Dr. Langley Gauss that no orders for weight bearing status. Order received for weight bearing as tolerated.

## 2017-10-15 NOTE — Progress Notes (Signed)
ADMISSION NOTE:  Pt. admitted to room 159 from PACU. Oriented to room, call bell, Ascom phones and staff. Bed in low position. Fall safety plan reviewed,non-skid socks in place, bed alarm on. Full assessment to Epic; skin assessed with [employer]. No complaints of pain. Daughters at bedside. Will continue to monitor.

## 2017-10-15 NOTE — Anesthesia Postprocedure Evaluation (Signed)
Anesthesia Post Note  Patient: Theodore Wiggins  Procedure(s) Performed: TOTAL KNEE ARTHROPLASTY (Left Knee)  Patient location during evaluation: PACU Anesthesia Type: Spinal and General Level of consciousness: awake and alert Pain management: pain level controlled Vital Signs Assessment: post-procedure vital signs reviewed and stable Respiratory status: spontaneous breathing, nonlabored ventilation and respiratory function stable Cardiovascular status: blood pressure returned to baseline and stable Postop Assessment: no apparent nausea or vomiting Anesthetic complications: no     Last Vitals:  Vitals:   10/15/17 1310 10/15/17 1401  BP: (!) 151/81 138/74  Pulse: (!) 58 60  Resp: 20 18  Temp: (!) 36.4 C 36.6 C  SpO2: 100% 99%    Last Pain:  Vitals:   10/15/17 1406  TempSrc:   PainSc: 6                  Jovita Gamma

## 2017-10-15 NOTE — Anesthesia Procedure Notes (Signed)
Spinal  Patient location during procedure: OR Start time: 10/15/2017 7:52 AM End time: 10/15/2017 7:58 AM Staffing Resident/CRNA: Oliva Bustard, CRNA Performed: resident/CRNA  Preanesthetic Checklist Completed: patient identified, site marked, surgical consent, pre-op evaluation, timeout performed, IV checked, risks and benefits discussed and monitors and equipment checked Spinal Block Patient position: sitting Prep: ChloraPrep Patient monitoring: heart rate, continuous pulse ox, blood pressure and cardiac monitor Approach: midline Location: L4-5 Injection technique: single-shot Needle Needle type: Pencan  Needle gauge: 25 G Needle length: 5 cm Assessment Sensory level: T6

## 2017-10-15 NOTE — Progress Notes (Signed)
  Subjective:  POST OP CHECK:  Patient reports that he had left knee pain earlier but it is feeling better know after pain medication.  Patient lying down on fold out chair in his room.  Patient states he was able to flex nearly 90 degrees with PT this afternoon.    Objective:   VITALS:   Vitals:   10/15/17 1401 10/15/17 1529 10/15/17 1712 10/15/17 2009  BP: 138/74 (!) 149/97 (!) 138/94 102/62  Pulse: 60 (!) 57 64 60  Resp: 18 18 18 18   Temp: 97.8 F (36.6 C) 97.6 F (36.4 C) (!) 97.5 F (36.4 C) 98.5 F (36.9 C)  TempSrc: Oral Oral Oral Oral  SpO2: 99% 100% 98% 97%  Weight:      Height:        PHYSICAL EXAM: Left lower extremity: Neurovascular intact Sensation intact distally Intact pulses distally Dorsiflexion/Plantar flexion intact   LABS  Results for orders placed or performed during the hospital encounter of 10/15/17 (from the past 24 hour(s))  ABO/Rh     Status: None   Collection Time: 10/15/17  6:41 AM  Result Value Ref Range   ABO/RH(D)      A POS Performed at [facility], [address] [place], [place] [address]     Dg Knee Left Port  Result Date: 10/15/2017 CLINICAL DATA:  Postop left knee EXAM: PORTABLE LEFT KNEE - 1-2 VIEW COMPARISON:  MR left knee of 07/12/2016 FINDINGS: Views of the left knee show the femoral and tibial components of the left total knee replaced and to be in good position with normal alignment. Some air is noted in the soft tissues and joint space postoperatively. No complicating features are seen. IMPRESSION: Left total knee replacement components in good position. No complicating features. Electronically Signed   By: Dwyane Dee M.D.   On: 10/15/2017 11:44    Assessment/Plan: Day of Surgery   Active Problems:   History of total knee arthroplasty, left   Hx of total knee arthroplasty, left  Patient stable and comfortable post-op.  Post-op x-rays show TKA prosthesis well positioned. No fracture or dislocation.   Continue 24 hours of post-op antibiotics.  Check labs in AM.   Foley to be removed in AM.  Continue PT tomorrow.  Lovenox to start tomorrow.    Juanell Fairly , MD 10/15/2017, 8:12 PM

## 2017-10-15 NOTE — Evaluation (Signed)
Physical Therapy Evaluation Patient Details Name: Theodore Wiggins MRN: [id number] DOB: 04/12/1945 Today's Date: 10/15/2017   History of Present Illness  Pt is a 72 y.o. 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  PMH includes htn and vertigo.  Clinical Impression  Prior to hospital admission, pt was independent with functional mobility (occasionally used SPC when L knee was painful).  Pt lives alone in 1 level home with steps to enter.  Currently pt is min assist semi-supine to sit; min assist to stand with RW; and CGA ambulating a few feet bed to recliner with RW.  Tolerated L LE ex's fairly well.  Pain 7-8/10 L knee beginning of session and 5-6/10 end of session (pt received pain meds prior to session and nurse notified end of session of pt's pain status).  Pt would benefit from skilled PT to address noted impairments and functional limitations (see below for any additional details).    Follow Up Recommendations Home health PT    Equipment Recommendations  Rolling walker with 5" wheels;3in1 (PT)    Recommendations for Other Services OT consult     Precautions / Restrictions Precautions Precautions: Fall Restrictions Weight Bearing Restrictions: Yes LLE Weight Bearing: Weight bearing as tolerated      Mobility  Bed Mobility Overal bed mobility: Needs Assistance Bed Mobility: Supine to Sit     Supine to sit: Min assist;HOB elevated     General bed mobility comments: assist for L LE; vc's for use of bed rail  Transfers Overall transfer level: Needs assistance Equipment used: Rolling walker (2 wheeled) Transfers: Sit to/from Stand Sit to Stand: Min assist         General transfer comment: assist to initiate stand up to RW; vc's for UE and LE placement  Ambulation/Gait Ambulation/Gait assistance: Min guard Gait Distance (Feet): 3 Feet(bed to recliner) Assistive device: Rolling walker (2 wheeled) Gait Pattern/deviations: Step-to pattern;Antalgic Gait velocity: decreased    General Gait Details: decreased stance time L LE; vc's for walker use and gait technique  Stairs            Wheelchair Mobility    Modified Rankin (Stroke Patients Only)       Balance Overall balance assessment: Needs assistance Sitting-balance support: No upper extremity supported;Feet supported Sitting balance-Leahy Scale: Good Sitting balance - Comments: steady sitting reaching within BOS   Standing balance support: Single extremity supported Standing balance-Leahy Scale: Poor Standing balance comment: requires at least single UE support for static standing balance                             Pertinent Vitals/Pain Pain Assessment: 0-10 Pain Score: 6  Pain Location: L knee Pain Descriptors / Indicators: Sore Pain Intervention(s): Limited activity within patient's tolerance;Monitored during session;Premedicated before session;Repositioned;Other (comment)(Polar care applied and activated)     Home Living Family/patient expects to be discharged to:: Private residence Living Arrangements: Alone Available Help at Discharge: Family Type of Home: House Home Access: Stairs to enter   [employer] of Steps: 1 plus 1 step no railing (back entrance) Home Layout: One level Home Equipment: Grab bars - tub/shower;Shower seat - built in      Prior Function Level of Independence: Independent with assistive device(s)         Comments: Pt occasionally using SPC d/t L knee pain.  Reports no falls in past 6 months.     Hand Dominance  Extremity/Trunk Assessment   Upper Extremity Assessment Upper Extremity Assessment: Overall WFL for tasks assessed    Lower Extremity Assessment Lower Extremity Assessment: RLE deficits/detail;LLE deficits/detail RLE Deficits / Details: strength and ROM WFL LLE Deficits / Details: at least 3-/5 hip flexion (limited d/t L knee pain); good L quad set; at least 3/5 DF LLE: Unable to fully assess due to pain     Cervical / Trunk Assessment Cervical / Trunk Assessment: Normal  Communication   Communication: No difficulties  Cognition Arousal/Alertness: Awake/alert Behavior During Therapy: WFL for tasks assessed/performed Overall Cognitive Status: Within Functional Limits for tasks assessed                                        General Comments General comments (skin integrity, edema, etc.): L knee immobilizer and polar care in place upon PT arrival.  D/t pt's L knee pain, KI utilized for OOB mobility.    Exercises Total Joint Exercises Ankle Circles/Pumps: AROM;Strengthening;Both;10 reps;Supine Quad Sets: AROM;Strengthening;Both;10 reps;Supine Heel Slides: AAROM;Strengthening;Left;10 reps;Supine Goniometric ROM: L knee extension semi-supine in bed 5 degrees short of neutral; L knee flexion semi-supine in bed 90 degrees AROM   Assessment/Plan    PT Assessment Patient needs continued PT services  PT Problem List Decreased strength;Decreased range of motion;Decreased activity tolerance;Decreased balance;Decreased mobility;Decreased knowledge of use of DME;Decreased knowledge of precautions;Pain       PT Treatment Interventions DME instruction;Gait training;Stair training;Functional mobility training;Therapeutic activities;Therapeutic exercise;Balance training;Patient/family education    PT Goals (Current goals can be found in the Care Plan section)  Acute Rehab PT Goals Patient Stated Goal: to have less pain PT Goal Formulation: With patient Time For Goal Achievement: 10/29/17 Potential to Achieve Goals: Good    Frequency BID   Barriers to discharge        Co-evaluation               AM-PAC PT "6 Clicks" Daily Activity  Outcome Measure Difficulty turning over in bed (including adjusting bedclothes, sheets and blankets)?: A Little Difficulty moving from lying on back to sitting on the side of the bed? : A Little Difficulty sitting down on and standing up  from a chair with arms (e.g., wheelchair, bedside commode, etc,.)?: Unable Help needed moving to and from a bed to chair (including a wheelchair)?: A Little Help needed walking in hospital room?: A Little Help needed climbing 3-5 steps with a railing? : A Lot 6 Click Score: 15    End of Session Equipment Utilized During Treatment: Gait belt Activity Tolerance: Patient tolerated treatment well Patient left: in chair;with call bell/phone within reach;with chair alarm set;with nursing/sitter in room;with family/visitor present;with SCD's reapplied;Other (comment)(L heel elevated via towel roll; R heel elevated; polar care in place and activated) Nurse Communication: Mobility status;Precautions;Weight bearing status;Other (comment)(Pt's pain status) PT Visit Diagnosis: Other abnormalities of gait and mobility (R26.89);Muscle weakness (generalized) (M62.81);Difficulty in walking, not elsewhere classified (R26.2);Pain Pain - Right/Left: Left Pain - part of body: Knee    Time: 1610-9604 PT Time Calculation (min) (ACUTE ONLY): 45 min   Charges:   PT Evaluation $PT Eval Low Complexity: 1 Low PT Treatments $Therapeutic Exercise: 8-22 mins $Therapeutic Activity: 8-22 mins       Hendricks Limes, PT 10/15/17, 5:10 PM [phone]

## 2017-10-15 NOTE — Op Note (Signed)
DATE OF SURGERY:  10/15/2017 TIME: 11:13 AM  PATIENT NAME:  Theodore Wiggins   AGE: 72 y.o.    PRE-OPERATIVE DIAGNOSIS:  UNILATERAL PRIMARY OSTEARTHRITIS OF LEFT KNEE  POST-OPERATIVE DIAGNOSIS:  Same  PROCEDURE:  Procedure(s): LEFT TOTAL KNEE ARTHROPLASTY  SURGEON:  Juanell Fairly, MD   ASSISTANT:  Altamese Cabal, PA  OPERATIVE IMPLANTS: Depuy PFC Sigma, Posterior Stabilized Femural component size 5, Tibia size rotating platform component size 5, Patella polyethylene 3-peg oval button size 38, with a 10 mm polyethylene insert.   PREOPERATIVE INDICATIONS:  Theodore Wiggins is an 72 y.o. male who has a diagnosis of tricompartmental left knee osteoarthritis and elected for a left total knee arthroplasty after failing nonoperative treatment, including corticosteroid and hyaluronic acid injections who has significant impairment of their activities of daily living.  Radiographs have demonstrated tricompartmental osteoarthritis joint space narrowing, osteophytes, subchondral sclerosis and cyst formation.  The risks, benefits, and alternatives were discussed at length including but not limited to the risks of infection, bleeding, nerve or blood vessel injury, knee stiffness, fracture, dislocation, loosening or failure of the hardware and the need for further surgery. Medical risks include but not limited to DVT and pulmonary embolism, myocardial infarction, stroke, pneumonia, respiratory failure and death. I discussed these risks with the patient in my office prior to the date of surgery. They understood these risks and were willing to proceed.  OPERATIVE DESCRIPTION:  The patient was brought to the operative room and placed in a supine position after undergoing placement of a spinal anesthetic.  A Foley catheter was placed.  IV antibiotics were given. Patient received ancef and clindamycin. The lower extremity was prepped and draped in the usual sterile fashion.  A time out was performed to  verify the patient's name, date of birth, medical record number, correct site of surgery and correct procedure to be performed. The timeout was also used to confirm the patient received antibiotics and that appropriate instruments, implants and radiographs studies were available in the room.  The leg was elevated and exsanguinated with an Esmarch and the tourniquet was inflated to 275 mmHg for 123 minutes..  A midline incision was made over the left knee. Full-thickness skin flaps were developed. A medial parapatellar arthrotomy was then made and the patella everted and the knee was brought into 90 of flexion. Hoffa's fat pad along with the cruciate ligaments and medial and lateral menisci were resected.   The distal femoral intramedullary canal was opened with a drill and the intramedullary distal femoral cutting jig was inserted into the femoral canal pinned into position. It was set at 5 degrees resecting 10 mm off the distal femur.  Care was taken to protect the collateral ligaments during distal femoral resection.  The distal femoral resection was performed with an oscillating saw. The femoral cutting guide was then removed.  The extramedullary tibial cutting guide was then placed using the anterior tibial crest and second ray of the foot as a references.  The tibial cutting guide was adjusted to allow for appropriate posterior slope.  The tibial cutting block was pinned into position. The slotted stylus was used to measure the proximal tibial resection of 10 mm off the high lateral side.  The tibial long rod alignment guide was then used to confirm position of the cutting block. A third cross pin through the tibial cutting block was then drilled into position to allow for rotational stability. Care was taken during the tibial resection to protect the medial and  collateral ligaments.  The resected tibial bone was removed along with the posterior horns of the menisci.  The PCL was sacrificed.  Extension  gap was measured with a spacer block and alignment and extension was confirmed using a long alignment rod.  The attention was then turned back to the femur. The posterior referencing distal femoral sizing guide was applied to the distal femur.  The femur was sized to be a size 5. Rotation of the referencing guide was checked with the epicondylar axis and Whitesides line. Then the 4-in-1 cutting jig was then applied to the distal femur. A stylus was used to confirm that the anterior femur would not be notched.   Then the anterior, posterior and chamfer femoral cuts were then made with an oscillating saw.  All posterior osteophytes were removed.  The flexion gap was then measured with a flexion spacer block and long alignment rod and was found to be symmetric with the extension gap and perpendicular to mechanical axis of the tibia.  The distal femoral preparation was completed by performing the posterior stabilized box cut using the cutting block. The entry site for the intramedullary femoral guide was filled with autologous bone graft from bone previously resected earlier in the case.  The proximal tibia plateau was then sized with trial trays. The best coverage was achieved with a size 5. This tibial tray was then pinned into position. The proximal tibia was then prepared with the reamer and keel punch.  After tibial preparation was completed, all trial components were inserted with polyethylene trials.  The knee was found to have excellent balance and full motion with a size 10 mm tibial polyethylene insert..    The attention was then turned to preparation of the patella. The thickness of the patella was measured with a caliper, the diameter measured with the patella templates.  The patella resection was then made with an oscillating saw using the patella cutting guide.   3 peg holes for the patella component were then drilled. The trial patella was then placed. Knee was taken through a full range of motion  and deemed to be stable with the trial components. All trial components were then removed. The knee capsule was then injected with Exparel. The joint was copiously irrigated with pulse lavage.  The final total knee arthroplasty components were then cemented into place with a 10 mm trial polyethylene insert and all excess methylmethacrylate was removed.  The joint was again copiously irrigated. After the cement had hardened the knee was again taken through a full range of motion. It was felt to be most stable with the 10 mm tibial polyethylene insert. The actual tibial polyethylene insert was then placed.   The knee was taken through a range of motion and the patella tracked well and the knee was again irrigated copiously.    The medial arthrotomy was closed with #0 Ethibond. The subcutaneous tissue closed with 0 and 2-0 vicryl, and skin approximated with staples.  A dry sterile and compressive dressing was applied.  A Polar Care was applied to the operative knee along with a knee immobilizer.  The patient was awakened and brought to the PACU in stable and satisfactory condition.  All sharp, lap and instrument counts were correct at the conclusion the case. I spoke with the patient's daughters in the postop consultation room to let them know the case had been performed without complication and the patient was stable in recovery room.

## 2017-10-15 NOTE — Anesthesia Post-op Follow-up Note (Signed)
Anesthesia QCDR form completed.        

## 2017-10-15 NOTE — Transfer of Care (Signed)
Immediate Anesthesia Transfer of Care Note  Patient: Theodore Wiggins  Procedure(s) Performed: TOTAL KNEE ARTHROPLASTY (Left Knee)  Patient Location: PACU  Anesthesia Type:Spinal  Level of Consciousness: awake  Airway & Oxygen Therapy: Patient Spontanous Breathing  Post-op Assessment: Report given to RN  Post vital signs: stable  Last Vitals:  Vitals Value Taken Time  BP 130/71 10/15/2017 10:48 AM  Temp 36.3 C 10/15/2017 10:48 AM  Pulse 70 10/15/2017 10:53 AM  Resp 18 10/15/2017 10:53 AM  SpO2 96 % 10/15/2017 10:53 AM  Vitals shown include unvalidated device data.  Last Pain:  Vitals:   10/15/17 0614  TempSrc: Tympanic  PainSc: 2          Complications: No apparent anesthesia complications

## 2017-10-15 NOTE — Anesthesia Preprocedure Evaluation (Addendum)
Anesthesia Evaluation  Patient identified by MRN, date of birth, ID band Patient awake    Reviewed: Allergy & Precautions, H&P , NPO status , Patient's Chart, lab work & pertinent test results  Airway Mallampati: III  TM Distance: >3 FB Neck ROM: full    Dental no notable dental hx. (+) Teeth Intact   Pulmonary neg pulmonary ROS, neg COPD,    breath sounds clear to auscultation       Cardiovascular hypertension, (-) Cardiac Stents and (-) CABG negative cardio ROS  (-) dysrhythmias  Rhythm:regular Rate:Normal     Neuro/Psych negative neurological ROS  negative psych ROS   GI/Hepatic negative GI ROS, Neg liver ROS, GERD  Controlled and Medicated,  Endo/Other  negative endocrine ROS  Renal/GU      Musculoskeletal  (+) Arthritis ,   Abdominal   Peds  Hematology negative hematology ROS (+)   Anesthesia Other Findings Past Medical History: No date: Arthritis No date: DDD (degenerative disc disease), lumbar No date: Hypertension No date: Vertigo  Past Surgical History: No date: CARDIOVASCULAR STRESS TEST No date: CATARACT EXTRACTION, BILATERAL No date: TONSILLECTOMY  BMI    Body Mass Index:  29.99 kg/m      Reproductive/Obstetrics negative OB ROS                            Anesthesia Physical Anesthesia Plan  ASA: II  Anesthesia Plan: Spinal and General   Post-op Pain Management:    Induction:   PONV Risk Score and Plan: Propofol infusion and TIVA  Airway Management Planned:   Additional Equipment:   Intra-op Plan:   Post-operative Plan:   Informed Consent: I have reviewed the patients History and Physical, chart, labs and discussed the procedure including the risks, benefits and alternatives for the proposed anesthesia with the patient or authorized representative who has indicated his/her understanding and acceptance.   Dental Advisory Given  Plan Discussed with:  Anesthesiologist, CRNA and Surgeon  Anesthesia Plan Comments:        Anesthesia Quick Evaluation

## 2017-10-15 NOTE — H&P (Signed)
The patient has been re-examined, and the chart reviewed, and there have been no interval changes to the documented history and physical.    The risks, benefits, and alternatives have been discussed at length, and the patient is willing to proceed.   

## 2017-10-16 ENCOUNTER — Encounter: Payer: Self-pay | Admitting: Orthopedic Surgery

## 2017-10-16 DIAGNOSIS — M1712 Unilateral primary osteoarthritis, left knee: Secondary | ICD-10-CM | POA: Diagnosis not present

## 2017-10-16 LAB — CBC
HCT: 31.1 % — ABNORMAL LOW (ref 40.0–52.0)
Hemoglobin: 11.3 g/dL — ABNORMAL LOW (ref 13.0–18.0)
MCH: 34 pg (ref 26.0–34.0)
MCHC: 36.5 g/dL — ABNORMAL HIGH (ref 32.0–36.0)
MCV: 93.3 fL (ref 80.0–100.0)
PLATELETS: 205 10*3/uL (ref 150–440)
RBC: 3.33 MIL/uL — AB (ref 4.40–5.90)
RDW: 12.6 % (ref 11.5–14.5)
WBC: 9.7 10*3/uL (ref 3.8–10.6)

## 2017-10-16 LAB — BASIC METABOLIC PANEL
ANION GAP: 7 (ref 5–15)
BUN: 11 mg/dL (ref 8–23)
CO2: 26 mmol/L (ref 22–32)
Calcium: 8.5 mg/dL — ABNORMAL LOW (ref 8.9–10.3)
Chloride: 97 mmol/L — ABNORMAL LOW (ref 98–111)
Creatinine, Ser: 0.93 mg/dL (ref 0.61–1.24)
GFR calc Af Amer: >60 mL/min (ref >60)
GFR calc non Af Amer: >60 mL/min (ref >60)
GLUCOSE: 128 mg/dL — AB (ref 70–99)
POTASSIUM: 4.4 mmol/L (ref 3.5–5.1)
Sodium: 130 mmol/L — ABNORMAL LOW (ref 135–145)

## 2017-10-16 NOTE — Progress Notes (Signed)
Physical Therapy Treatment Patient Details Name: Theodore Wiggins MRN: [id number] DOB: [birth year] Today's Date: 10/16/2017    History of Present Illness Pt is a 72 y.o. 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  PMH includes htn and vertigo.    PT Comments     Pt progressing well with ability to ambulate 140 feet in addition to 20 feet walked to the bathroom. Pt required CGA for transfers and ambulation. Independent with standing balance at toilet and sink, but required VC and assistance for positioning of walker. Pt requires VC for placement of walker to allow for increased step length as well as VC for UE support with walker during ambulation. Pt tolerated initiation of seated long arc quad and knee flexion exercises with only moderate discomfort noted during knee flexion. Pain L knee 8/10 beginning and end of session with no change in pain noted during activity. PT will continue to progress pt with strengthening, functional mobility training, increased ambulation distance, and stair training for preparation for discharge.   Follow Up Recommendations  Outpatient PT     Equipment Recommendations  Rolling walker with 5" wheels;3in1 (PT)    Recommendations for Other Services       Precautions / Restrictions Precautions Precautions: Fall Restrictions Weight Bearing Restrictions: Yes LLE Weight Bearing: Weight bearing as tolerated    Mobility  Bed Mobility               General bed mobility comments: pt in chair beginning of session and left seated on edge of bed for OT following session, no bed mobility performed  Transfers Overall transfer level: Needs assistance Equipment used: Rolling walker (2 wheeled) Transfers: Sit to/from Stand Sit to Stand: Min guard         General transfer comment: VC for UE and LE placement; x1 trial from chair   Ambulation/Gait Ambulation/Gait assistance: Min guard Gait Distance (Feet): (20 ft to bathroom, 140 ft ) Assistive device: Rolling  walker (2 wheeled) Gait Pattern/deviations: Step-to pattern;Antalgic Gait velocity: VC to decrease cadence    General Gait Details: decreased stance time L LE; VC to roll walker further to allow increased space for stepping, VC to increase UE support with walker for decreased WB on L leg due to pain, VC for safety   Stairs             Wheelchair Mobility    Modified Rankin (Stroke Patients Only)       Balance Overall balance assessment: Needs assistance Sitting-balance support: No upper extremity supported;Feet supported Sitting balance-Leahy Scale: Good Sitting balance - Comments: steady sitting reaching within BOS   Standing balance support: No upper extremity supported Standing balance-Leahy Scale: Good Standing balance comment: steady balance standing to use toilet and washing hands at sink with no UE support needed, CGA for safety                            Cognition Arousal/Alertness: Awake/alert Behavior During Therapy: WFL for tasks assessed/performed Overall Cognitive Status: Within Functional Limits for tasks assessed                                        Exercises Total Joint Exercises Long Arc Quad: AAROM;Left;10 reps;Seated;Strengthening Knee Flexion: AAROM;Left;10 reps;Seated Marching in Standing: AROM;Strengthening;Left;10 reps;Seated    General Comments        Pertinent Vitals/Pain Pain  Assessment: 0-10 Pain Score: 8  Pain Location: L knee Pain Descriptors / Indicators: Sore Pain Intervention(s): Monitored during session;Repositioned;Limited activity within patient's tolerance;Premedicated before session    Home Living                      Prior Function            PT Goals (current goals can now be found in the care plan section) Acute Rehab PT Goals Patient Stated Goal: to go home and have less pain PT Goal Formulation: With patient Time For Goal Achievement: 10/29/17 Potential to Achieve Goals:  Good Progress towards PT goals: Progressing toward goals    Frequency    BID      PT Plan Current plan remains appropriate    Co-evaluation              AM-PAC PT "6 Clicks" Daily Activity  Outcome Measure  Difficulty turning over in bed (including adjusting bedclothes, sheets and blankets)?: A Little Difficulty moving from lying on back to sitting on the side of the bed? : A Little Difficulty sitting down on and standing up from a chair with arms (e.g., wheelchair, bedside commode, etc,.)?: Unable Help needed moving to and from a bed to chair (including a wheelchair)?: A Little Help needed walking in hospital room?: A Little Help needed climbing 3-5 steps with a railing? : A Little 6 Click Score: 16    End of Session Equipment Utilized During Treatment: Gait belt Activity Tolerance: Patient limited by pain;Patient tolerated treatment well;No increased pain(Pt with consistent pain level of 8/10 throughout session) Patient left: Other (comment)(seated edge of bed with OT) Nurse Communication: (pain status) PT Visit Diagnosis: Other abnormalities of gait and mobility (R26.89);Muscle weakness (generalized) (M62.81);Difficulty in walking, not elsewhere classified (R26.2);Pain Pain - Right/Left: Left Pain - part of body: Knee     Time: 4098-1191 PT Time Calculation (min) (ACUTE ONLY): 39 min  Charges:                           Mickel Duhamel, SPT 10/16/2017, 4:16 PM

## 2017-10-16 NOTE — Evaluation (Signed)
Occupational Therapy Evaluation Patient Details Name: Theodore Wiggins MRN: [id number] DOB: 1945/04/25 Today's Date: 10/16/2017    History of Present Illness Pt is a 73 y.o. 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  PMH includes htn and vertigo.   Clinical Impression   Pt seen for OT evaluation this date, POD#1 from above surgery. Pt was independent in all ADLs prior to surgery, however occasionally using SPC for mobility due to L knee pain. Pt is eager to return to PLOF with less pain and improved safety and independence. Pt currently requires minimal assist for LB dressing and bathing while in seated position due to pain and limited AROM of L knee. Pt/dtr instructed in polar care mgt, KI, falls prevention strategies, home/routines modifications, and DME/AE for LB bathing and dressing tasks. Pt would benefit from skilled OT services including additional instruction in dressing techniques with or without assistive devices for dressing and bathing skills to support recall and carryover prior to discharge and ultimately to maximize safety, independence, and minimize falls risk and caregiver burden. Do not currently anticipate any OT needs following this hospitalization.      Follow Up Recommendations  Supervision - Intermittent;No OT follow up    Equipment Recommendations  3 in 1 bedside commode    Recommendations for Other Services       Precautions / Restrictions Precautions Precautions: Fall Restrictions Weight Bearing Restrictions: Yes LLE Weight Bearing: Weight bearing as tolerated      Mobility Bed Mobility Overal bed mobility: Needs Assistance Bed Mobility: Sit to Supine       Sit to supine: Supervision   General bed mobility comments: pt in chair beginning of session and left seated on edge of bed for OT following session, no bed mobility performed  Transfers Overall transfer level: Needs assistance Equipment used: Rolling walker (2 wheeled) Transfers: Sit to/from  Stand Sit to Stand: Min guard         General transfer comment: VC for UE and LE placement; x1 trial from chair     Balance Overall balance assessment: Needs assistance Sitting-balance support: No upper extremity supported;Feet supported Sitting balance-Leahy Scale: Good Sitting balance - Comments: steady sitting reaching within BOS   Standing balance support: No upper extremity supported Standing balance-Leahy Scale: Good Standing balance comment: steady balance standing to use toilet and washing hands at sink with no UE support needed, CGA for safety                           ADL either performed or assessed with clinical judgement   ADL Overall ADL's : Needs assistance/impaired Eating/Feeding: Sitting;Independent   Grooming: Sitting;Independent   Upper Body Bathing: Sitting;Modified independent   Lower Body Bathing: Sit to/from stand;Minimal assistance;With caregiver independent assisting   Upper Body Dressing : Sitting;Modified independent   Lower Body Dressing: Sit to/from stand;Minimal assistance;With caregiver independent assisting Lower Body Dressing Details (indicate cue type and reason): pt/dtr educated in polar care mgt  Toilet Transfer: RW;Min guard;Ambulation;BSC Toilet Transfer Details (indicate cue type and reason): pt/dtr educated in use of BSC for overnight vs daytime toileting needs                 Vision Baseline Vision/History: Wears glasses Wears Glasses: At all times Patient Visual Report: No change from baseline       Perception     Praxis      Pertinent Vitals/Pain Pain Assessment: 0-10 Pain Score: 7  Pain Location: L  knee after mobility, decreased to 5/10 during session when at rest Pain Descriptors / Indicators: Sore Pain Intervention(s): Limited activity within patient's tolerance;Monitored during session;Repositioned;Ice applied     Hand Dominance     Extremity/Trunk Assessment Upper Extremity Assessment Upper  Extremity Assessment: Overall WFL for tasks assessed   Lower Extremity Assessment Lower Extremity Assessment: Defer to PT evaluation   Cervical / Trunk Assessment Cervical / Trunk Assessment: Normal   Communication Communication Communication: No difficulties   Cognition Arousal/Alertness: Awake/alert Behavior During Therapy: WFL for tasks assessed/performed Overall Cognitive Status: Within Functional Limits for tasks assessed                                     General Comments  L knee immobilizer and polar care in place at end of session    Exercises Other Exercises Other Exercises: Pt/dtr educated in falls prevention strategies    Shoulder Instructions      Home Living Family/patient expects to be discharged to:: Private residence Living Arrangements: Alone Available Help at Discharge: Family Type of Home: House Home Access: Stairs to enter [employer] of Steps: 1 plus 1 step no railing (back entrance)   Home Layout: One level     Bathroom Shower/Tub: Producer, television/film/video: Handicapped height     Home Equipment: Grab bars - tub/shower;Shower seat - built Designer, fashion/clothing: Reacher        Prior Functioning/Environment Level of Independence: Independent with assistive device(s)        Comments: Pt occasionally using SPC d/t L knee pain.  Reports no falls in past 6 months. Indep with ADL, IADL. Retired Arts administrator, enjoys working on cars, Soil scientist, Catering manager. Sings in a quartet and involved with family and church.        OT Problem List: Decreased strength;Decreased knowledge of use of DME or AE;Decreased range of motion;Pain;Impaired balance (sitting and/or standing)      OT Treatment/Interventions: Self-care/ADL training;Balance training;Therapeutic exercise;Therapeutic activities;DME and/or AE instruction;Patient/family education    OT Goals(Current goals can be found in the care plan  section) Acute Rehab OT Goals Patient Stated Goal: to go home and have less pain OT Goal Formulation: With patient/family Time For Goal Achievement: 10/30/17 Potential to Achieve Goals: Good ADL Goals Pt Will Perform Lower Body Dressing: sit to/from stand;with modified independence;with adaptive equipment Pt Will Transfer to Toilet: with modified independence;bedside commode;ambulating(LRAD for amb) Additional ADL Goal #1: Pt will independently instruct family/caregiver in compression stocking mgt including donning/doffing, wear schedule, and positioning. Additional ADL Goal #2: Pt will independently instruct family/caregiver in polar care mgt including donning/doffing, wear schedule, and positioning.  OT Frequency: Min 1X/week   Barriers to D/C:            Co-evaluation              AM-PAC PT "6 Clicks" Daily Activity     Outcome Measure Help from another person eating meals?: None Help from another person taking care of personal grooming?: None Help from another person toileting, which includes using toliet, bedpan, or urinal?: A Little Help from another person bathing (including washing, rinsing, drying)?: A Little Help from another person to put on and taking off regular upper body clothing?: None Help from another person to put on and taking off regular lower body clothing?: A Little 6 Click Score: 21   End of Session  Activity Tolerance: Patient tolerated treatment well Patient left: in bed;with call bell/phone within reach;with bed alarm set;with family/visitor present;with SCD's reapplied;Other (comment)(KI in place, polar care in place)  OT Visit Diagnosis: Other abnormalities of gait and mobility (R26.89);Pain Pain - Right/Left: Left Pain - part of body: Knee                Time: 1610-9604 OT Time Calculation (min): 40 min Charges:  OT General Charges $OT Visit: 1 Visit OT Evaluation $OT Eval Low Complexity: 1 Low OT Treatments $Self Care/Home Management  : 23-37 mins  Richrd Prime, MPH, MS, OTR/L ascom [phone] 10/16/17, 4:37 PM

## 2017-10-16 NOTE — Progress Notes (Signed)
Physical Therapy Treatment Patient Details Name: Theodore Wiggins MRN: [id number] DOB: 18-Aug-1945 Today's Date: 10/16/2017    History of Present Illness Pt is a 72 y.o. 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  PMH includes htn and vertigo.    PT Comments    Pt able to progress to CGA with transfers and CGA ambulating 100 feet with RW.  Occasional vc's required for pt to slow down during functional mobility.  Pain L knee 6/10 beginning of session at rest; increased to 10/10 with ambulation; and decreased to 3/10 end of session resting in chair.  L knee AROM 90 degrees flexion.  Pt reports having OP PT set-up for Monday already.  Will continue to progress pt with strengthening, ROM, and progressive functional mobility per pt tolerance.    Follow Up Recommendations  Outpatient PT     Equipment Recommendations  Rolling walker with 5" wheels;3in1 (PT)    Recommendations for Other Services OT consult     Precautions / Restrictions Precautions Precautions: Fall Restrictions Weight Bearing Restrictions: Yes LLE Weight Bearing: Weight bearing as tolerated    Mobility  Bed Mobility Overal bed mobility: Needs Assistance Bed Mobility: Supine to Sit     Supine to sit: Supervision;HOB elevated     General bed mobility comments: mild increased effort to perform on own  Transfers Overall transfer level: Needs assistance Equipment used: Rolling walker (2 wheeled) Transfers: Sit to/from Stand Sit to Stand: Min guard         General transfer comment: vc's for UE and LE placement; x1 trial from bed and x1 trial from [facility] over toilet  Ambulation/Gait Ambulation/Gait assistance: Min guard Gait Distance (Feet): (20 feet (to bathroom); 100 feet) Assistive device: Rolling walker (2 wheeled) Gait Pattern/deviations: Step-to pattern;Antalgic Gait velocity: varied between decreased and increased   General Gait Details: decreased stance time L LE; vc's for walker use and gait technique;  occasional vc's to slow down especially with turning   Stairs             Wheelchair Mobility    Modified Rankin (Stroke Patients Only)       Balance Overall balance assessment: Needs assistance Sitting-balance support: No upper extremity supported;Feet supported Sitting balance-Leahy Scale: Good Sitting balance - Comments: steady sitting reaching within BOS   Standing balance support: No upper extremity supported Standing balance-Leahy Scale: Good Standing balance comment: steady standing washing hands at sink                            Cognition Arousal/Alertness: Awake/alert Behavior During Therapy: WFL for tasks assessed/performed Overall Cognitive Status: Within Functional Limits for tasks assessed                                        Exercises Total Joint Exercises Ankle Circles/Pumps: AROM;Strengthening;Both;10 reps;Supine Quad Sets: AROM;Strengthening;Both;10 reps;Supine Short Arc Quad: AROM;Strengthening;Left;10 reps;Supine Heel Slides: AAROM;Strengthening;Left;10 reps;Supine Hip ABduction/ADduction: AAROM;Strengthening;Left;10 reps;Supine Straight Leg Raises: AROM;Strengthening;Left;10 reps;Supine Goniometric ROM: L knee extension semi-supine in bed AROM 4 degrees short of neutral; L knee flexion sitting edge of recliner AROM 90 degrees    General Comments General comments (skin integrity, edema, etc.): L knee immobilizer and polar care in place beginning and end of session.  KI doffed for PT session's activities.  Pt's family present during session.  Nursing cleared pt for participation in physical therapy.  Pt agreeable to PT session.      Pertinent Vitals/Pain Pain Assessment: 0-10 Pain Score: 3  Pain Location: L knee Pain Descriptors / Indicators: Sore Pain Intervention(s): Limited activity within patient's tolerance;Monitored during session;Premedicated before session;Repositioned;Other (comment)(polar care applied and  activated)    Home Living                      Prior Function            PT Goals (current goals can now be found in the care plan section) Acute Rehab PT Goals Patient Stated Goal: to have less pain PT Goal Formulation: With patient Time For Goal Achievement: 10/29/17 Potential to Achieve Goals: Good Progress towards PT goals: Progressing toward goals    Frequency    BID      PT Plan Discharge plan needs to be updated    Co-evaluation              AM-PAC PT "6 Clicks" Daily Activity  Outcome Measure  Difficulty turning over in bed (including adjusting bedclothes, sheets and blankets)?: A Little Difficulty moving from lying on back to sitting on the side of the bed? : A Little Difficulty sitting down on and standing up from a chair with arms (e.g., wheelchair, bedside commode, etc,.)?: Unable Help needed moving to and from a bed to chair (including a wheelchair)?: A Little Help needed walking in hospital room?: A Little Help needed climbing 3-5 steps with a railing? : A Little 6 Click Score: 16    End of Session Equipment Utilized During Treatment: Gait belt Activity Tolerance: Patient limited by pain Patient left: in chair;with call bell/phone within reach;with chair alarm set;with family/visitor present;with SCD's reapplied;Other (comment)(L heel elevated via towel roll with KI donned L LE; R heel elevated via pillow; polar care on and activated) Nurse Communication: Mobility status;Precautions;Weight bearing status PT Visit Diagnosis: Other abnormalities of gait and mobility (R26.89);Muscle weakness (generalized) (M62.81);Difficulty in walking, not elsewhere classified (R26.2);Pain Pain - Right/Left: Left Pain - part of body: Knee     Time: 3291-9166 PT Time Calculation (min) (ACUTE ONLY): 42 min  Charges:  $Gait Training: 8-22 mins $Therapeutic Exercise: 8-22 mins $Therapeutic Activity: 8-22 mins                    Hendricks Limes,  PT 10/16/17, 12:00 PM [phone]

## 2017-10-16 NOTE — Progress Notes (Signed)
  Subjective:  POD #1 s/p left TKA.  Patient reports left knee pain as mild to moderate.  Patient was able to walk out of room with PT today.  Patient able to urinate after foley d/c'd.  Objective:   VITALS:   Vitals:   10/16/17 0109 10/16/17 0423 10/16/17 0739 10/16/17 1232  BP: 113/69 97/64 106/60 124/77  Pulse: 63 63 70 67  Resp: 17 18    Temp: 98.1 F (36.7 C) (!) 97.5 F (36.4 C) 98 F (36.7 C) 97.9 F (36.6 C)  TempSrc: Oral Oral Oral Oral  SpO2: 97% 97% 96% 99%  Weight:      Height:        PHYSICAL EXAM: Left knee: Neurovascular intact Sensation intact distally Intact pulses distally Dorsiflexion/Plantar flexion intact Incision: dressing C/D/I Compartment soft  LABS  No results found for this or any previous visit (from the past 24 hour(s)).  Dg Knee Left Port  Result Date: 10/15/2017 CLINICAL DATA:  Postop left knee EXAM: PORTABLE LEFT KNEE - 1-2 VIEW COMPARISON:  MR left knee of 07/12/2016 FINDINGS: Views of the left knee show the femoral and tibial components of the left total knee replaced and to be in good position with normal alignment. Some air is noted in the soft tissues and joint space postoperatively. No complicating features are seen. IMPRESSION: Left total knee replacement components in good position. No complicating features. Electronically Signed   By: Dwyane Dee M.D.   On: 10/15/2017 11:44    Assessment/Plan: 1 Day Post-Op   Active Problems:   History of total knee arthroplasty, left   Hx of total knee arthroplasty, left  Start lovenox for DVT prophylaxis.  Check CBC and BMP today.  Continue PT.  Possible discharge home tomorrow.    Theodore Wiggins , MD 10/16/2017, 1:07 PM

## 2017-10-16 NOTE — Progress Notes (Signed)
OT Cancellation Note  Patient Details Name: Theodore Wiggins MRN: [id number] DOB: [birth year]   Cancelled Treatment:    Reason Eval/Treat Not Completed: Other (comment). Order received, chart reviewed. Pt working with PT upon initial attempt. Will re-attempt at later time as pt is available and medically appropriate.   Richrd Prime, MPH, MS, OTR/L ascom [phone] 10/16/17, 9:51 AM

## 2017-10-17 DIAGNOSIS — M1712 Unilateral primary osteoarthritis, left knee: Secondary | ICD-10-CM | POA: Diagnosis not present

## 2017-10-17 LAB — CBC
HEMATOCRIT: 26.8 % — AB (ref 40.0–52.0)
HEMOGLOBIN: 9.7 g/dL — AB (ref 13.0–18.0)
MCH: 34.1 pg — ABNORMAL HIGH (ref 26.0–34.0)
MCHC: 36.2 g/dL — AB (ref 32.0–36.0)
MCV: 94.2 fL (ref 80.0–100.0)
Platelets: 169 10*3/uL (ref 150–440)
RBC: 2.85 MIL/uL — ABNORMAL LOW (ref 4.40–5.90)
RDW: 12.5 % (ref 11.5–14.5)
WBC: 9 10*3/uL (ref 3.8–10.6)

## 2017-10-17 MED ORDER — BISACODYL 10 MG RE SUPP
10.0000 mg | Freq: Once | RECTAL | Status: AC
  Administered 2017-10-17: 10 mg via RECTAL
  Filled 2017-10-17: qty 1

## 2017-10-17 MED ORDER — MAGNESIUM CITRATE PO SOLN
1.0000 | Freq: Once | ORAL | Status: AC
  Administered 2017-10-17: 1 via ORAL
  Filled 2017-10-17: qty 296

## 2017-10-17 MED ORDER — FLEET ENEMA 7-19 GM/118ML RE ENEM: 1.0000 | ENEMA | Freq: Every day | RECTAL | Status: DC | PRN

## 2017-10-17 NOTE — Progress Notes (Signed)
  Subjective:  POD #2 s/p left TKA.  Patient reports left knee pain as moderate.  Knee pain increases with movement.  Patient was able to walk to the physical therapy room today and climb stairs.  In the a.m. session of physical therapy patient became lightheaded and had an episode of nausea and vomiting.  Patient has not yet had a bowel movement but is able to urinate.  He is tolerating a p.o. diet.  Objective:   VITALS:   Vitals:   10/16/17 2317 10/17/17 0746 10/17/17 0935 10/17/17 1609  BP: 124/77 129/81 117/70 129/69  Pulse: 86 73 80 79  Resp: 20   (!) 21  Temp: 100.1 F (37.8 C) 98 F (36.7 C)  99.5 F (37.5 C)  TempSrc: Oral Oral  Oral  SpO2: 94% 97%  98%  Weight:      Height:        PHYSICAL EXAM: Left lower extremity: Neurovascular intact Sensation intact distally Intact pulses distally Dorsiflexion/Plantar flexion intact No cellulitis present Compartment soft  LABS  Results for orders placed or performed during the hospital encounter of 10/15/17 (from the past 24 hour(s))  CBC     Status: Abnormal   Collection Time: 10/17/17  5:41 AM  Result Value Ref Range   WBC 9.0 3.8 - 10.6 K/uL   RBC 2.85 (L) 4.40 - 5.90 MIL/uL   Hemoglobin 9.7 (L) 13.0 - 18.0 g/dL   HCT 16.126.8 (L) 09.640.0 - 04.552.0 %   MCV 94.2 80.0 - 100.0 fL   MCH 34.1 (H) 26.0 - 34.0 pg   MCHC 36.2 (H) 32.0 - 36.0 g/dL   RDW 40.912.5 81.111.5 - 91.414.5 %   Platelets 169 150 - 440 K/uL    No results found.  Assessment/Plan: 2 Days Post-Op   Active Problems:   History of total knee arthroplasty, left   Hx of total knee arthroplasty, left  Will change dressing tomorrow.  Continue with physical therapy.  Continue current pain management.  Patient written for bowel medications to assist with constipation.  Patient encouraged to use incentive spirometry 10 times an hour while awake.  Possible discharge home tomorrow.  Therapy is recommending home health PT at this time.    Juanell FairlyKRASINSKI, Shakeem Stern , MD 10/17/2017, 6:42  PM

## 2017-10-17 NOTE — Plan of Care (Signed)
Care plan reviewed with patient. Denied any questions.

## 2017-10-17 NOTE — Progress Notes (Addendum)
Physical Therapy Treatment Patient Details Name: Theodore Wiggins MRN: [id number] DOB: 09/25/1945 Today's Date: 10/17/2017    History of Present Illness Pt is a 72 y.o. 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  PMH includes htn and vertigo.    PT Comments    Pt improved with ambulation, strength, pain, and stair negotiation however continues to have difficulty safety awareness, knee extension, and safety with stairs/ambulation. Pt requiring min assist on stairs however frequent VC and very close supervision. Improved quad contraction with exercises primarily with SLR and short arc quad. Pt still unsafe for OPPT and will require HPPT as most safe and appropriate option.   Follow Up Recommendations  Home health PT     Equipment Recommendations  Rolling walker with 5" wheels    Recommendations for Other Services       Precautions / Restrictions Precautions Precautions: Fall Required Braces or Orthoses: Knee Immobilizer - Left Restrictions Weight Bearing Restrictions: Yes LLE Weight Bearing: Weight bearing as tolerated    Mobility  Bed Mobility Overal bed mobility: Modified Independent Bed Mobility: Sit to Supine       Sit to supine: Supervision   General bed mobility comments: difficulty moving L leg onto bed during sit to supine   Transfers Overall transfer level: Needs assistance Equipment used: Rolling walker (2 wheeled) Transfers: Sit to/from Stand Sit to Stand: Min guard         General transfer comment: pt declined 2/2 pain  Ambulation/Gait Ambulation/Gait assistance: Min guard Gait Distance (Feet): 250 Feet Assistive device: Rolling walker (2 wheeled) Gait Pattern/deviations: Antalgic     General Gait Details: pt continues to require knee immobilizer due to low grade buckling on L, improved gait pattern noted with increased cadence, overall confidence with less UE use, VC for safety and sequencing    Stairs Stairs: Yes Stairs assistance: Min assist;+2  safety/equipment Stair Management: With walker;Backwards Number of Stairs: 4 General stair comments: pt continues to require cueing for stair negotiation and safety, continued physical difficulty and safety awareness issues, improved impulsivity and execution but requires close supervision and heavy cuing    Wheelchair Mobility    Modified Rankin (Stroke Patients Only)       Balance Overall balance assessment: Needs assistance Sitting-balance support: No upper extremity supported;Feet supported Sitting balance-Leahy Scale: Good         Standing balance comment: no overt LOB pt reliant on walker                            Cognition Arousal/Alertness: Suspect due to medications(pt with some scattered communication) Behavior During Therapy: Impulsive;WFL for tasks assessed/performed Overall Cognitive Status: Within Functional Limits for tasks assessed                                        Exercises Total Joint Exercises Quad Sets: AROM;Strengthening;Left;10 reps Short Arc Quad: Left;10 reps;AROM;AAROM(5 with assist 5 without assist) Heel Slides: AAROM;Strengthening;Left;10 reps(resisted leg extension) Straight Leg Raises: AAROM;Left;Supine;10 reps(pt still needing assist however showed much more confidence and tolerance with decreased assist from PT) Other Exercises Other Exercises: Pt/dtr/gf educated in falls prevention strategies, home set up/mods to maximize safety    General Comments        Pertinent Vitals/Pain Pain Assessment: 0-10 Pain Score: 10-Worst pain ever Pain Location: L knee at rest, reports slight improvement in pain  but still reporting 10/10 Pain Descriptors / Indicators: Sore;Sharp Pain Intervention(s): Limited activity within patient's tolerance;Premedicated before session;Monitored during session;Repositioned    Home Living                      Prior Function            PT Goals (current goals can now be  found in the care plan section) Acute Rehab PT Goals Patient Stated Goal: to go home and have less pain Progress towards PT goals: Progressing toward goals    Frequency    BID      PT Plan Current plan remains appropriate    Co-evaluation              AM-PAC PT "6 Clicks" Daily Activity  Outcome Measure  Difficulty turning over in bed (including adjusting bedclothes, sheets and blankets)?: A Little Difficulty moving from lying on back to sitting on the side of the bed? : A Little Difficulty sitting down on and standing up from a chair with arms (e.g., wheelchair, bedside commode, etc,.)?: A Little Help needed moving to and from a bed to chair (including a wheelchair)?: None Help needed walking in hospital room?: A Little Help needed climbing 3-5 steps with a railing? : A Lot 6 Click Score: 18    End of Session Equipment Utilized During Treatment: Gait belt;Left knee immobilizer Activity Tolerance: Patient tolerated treatment well Patient left: with call bell/phone within reach;with bed alarm set Nurse Communication: (pain status and mentation) PT Visit Diagnosis: Other abnormalities of gait and mobility (R26.89);Muscle weakness (generalized) (M62.81);Difficulty in walking, not elsewhere classified (R26.2);Pain Pain - Right/Left: Left Pain - part of body: Knee     Time: 1413-1500 PT Time Calculation (min) (ACUTE ONLY): 47 min  Charges:                        Mickel Duhamel, SPT 10/17/2017, 3:33 PM

## 2017-10-17 NOTE — Progress Notes (Signed)
Physical Therapy Treatment Patient Details Name: Theodore Wiggins MRN: [id number] DOB: 12-02-1945 Today's Date: 10/17/2017    History of Present Illness Pt is a 72 y.o. 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  PMH includes htn and vertigo.    PT Comments    Pt demonstrated decreased overall tolerance to exercise and ambulation due to pain. Pt had decreased independence with short arc quads, SLR, and other exercises involving quad engagement. Continued with ambulation with improvements noted but still heavily reliant on walker and cueing for gait pattern. Pt struggled with stair negotiation. Inability to negotiate stairs going forward. Going backwards pt had significant difficulty, pain, and impulsivity that necessitated increased assistance from PT. During final step pt did not place L foot on the ground before impulsively un-weighting R and had both feet off the ground without landing safely. Pt then began to feel lightheaded and reported feeling as though he would "blackout" and PT's assisted him to supine position, alerted nursing, and took BP. 117/70 after a few minutes lying supine. With rest time pt began to feel better and was able to ambulate back to his room however following pt positioning pt threw up breakfast. Per today's session pt is not fit for OPPT and will require HHPT.   Follow Up Recommendations  Home health PT     Equipment Recommendations  Rolling walker with 5" wheels    Recommendations for Other Services       Precautions / Restrictions Precautions Precautions: Fall Required Braces or Orthoses: Knee Immobilizer - Left Restrictions Weight Bearing Restrictions: Yes LLE Weight Bearing: Weight bearing as tolerated    Mobility  Bed Mobility Overal bed mobility: Modified Independent Bed Mobility: Supine to Sit     Supine to sit: Modified independent (Device/Increase time);Supervision     General bed mobility comments: pt requiring UE assistance for supine to sit  and appeared guarded secondary to pain and quad control   Transfers Overall transfer level: Needs assistance Equipment used: Rolling walker (2 wheeled) Transfers: Sit to/from Stand Sit to Stand: Min guard         General transfer comment: VC for LE positioning, mild impulsivity with sitting to chair, difficulty positioning appropriately for sitting, overall pt able to physically tolerate sit to stand from low chair without assistance   Ambulation/Gait Ambulation/Gait assistance: Min guard Gait Distance (Feet): 100 Feet(15300feet x2) Assistive device: Rolling walker (2 wheeled) Gait Pattern/deviations: Antalgic     General Gait Details: initial step to gait pattern, pt able to achieve step through gait with VC, inconsistent step length at times, knee immobilizer required for ambulation pt appeared to have low grade buckling on L, by end pt was able to maintain improved consistent walker and gait pattern without full confidence   Stairs Stairs: Yes Stairs assistance: Mod assist;+2 safety/equipment Stair Management: With walker;Backwards Number of Stairs: 4 General stair comments: initial difficulty with forward stair negotiation due to pain, 4 stairs going up backwards but stabilization of walker needed from PT and significant difficulty, pt made impulsive and unsafe decisions requiring hands on assist and constant VC   Wheelchair Mobility    Modified Rankin (Stroke Patients Only)       Balance               Standing balance comment: no overt LOB pt reliant on walker                            Cognition  Arousal/Alertness: Awake/alert Behavior During Therapy: WFL for tasks assessed/performed Overall Cognitive Status: Within Functional Limits for tasks assessed                                        Exercises Total Joint Exercises Ankle Circles/Pumps: AROM;Strengthening;Left;10 reps;Supine Quad Sets: AROM;Strengthening;Left;10  reps;Supine Short Arc Quad: AAROM;Left;10 reps;Supine Hip ABduction/ADduction: AROM;Strengthening;Left;10 reps Straight Leg Raises: AAROM;Left;15 reps;Supine(first 10 AAROM, last 5 pt self assist with UE unable to obtain TKE) Knee Flexion: PROM;Left;5 reps(ROM to pain tolerance) Goniometric ROM: L knee supine PROM 3-94    General Comments        Pertinent Vitals/Pain Pain Assessment: 0-10 Pain Score: 7  Pain Location: L knee beginning of session Pain Descriptors / Indicators: Sore Pain Intervention(s): Premedicated before session;Monitored during session(minimal pain medication the night before this treatment)    Home Living                      Prior Function            PT Goals (current goals can now be found in the care plan section)      Frequency    BID      PT Plan Discharge plan needs to be updated    Co-evaluation              AM-PAC PT "6 Clicks" Daily Activity  Outcome Measure  Difficulty turning over in bed (including adjusting bedclothes, sheets and blankets)?: A Little Difficulty moving from lying on back to sitting on the side of the bed? : A Little Difficulty sitting down on and standing up from a chair with arms (e.g., wheelchair, bedside commode, etc,.)?: A Little Help needed moving to and from a bed to chair (including a wheelchair)?: None Help needed walking in hospital room?: A Little Help needed climbing 3-5 steps with a railing? : A Lot 6 Click Score: 18    End of Session Equipment Utilized During Treatment: Gait belt;Left knee immobilizer Activity Tolerance: Patient limited by pain Patient left: with chair alarm set;with call bell/phone within reach Nurse Communication: (episode of lightheadidness, nausea, and need for pain medication) PT Visit Diagnosis: Other abnormalities of gait and mobility (R26.89);Muscle weakness (generalized) (M62.81);Difficulty in walking, not elsewhere classified (R26.2);Pain Pain - Right/Left:  Left Pain - part of body: Knee     Time: 0820-0950 PT Time Calculation (min) (ACUTE ONLY): 90 min  Charges:                        Mickel Duhamel, SPT 10/17/2017, 10:32 AM

## 2017-10-17 NOTE — Progress Notes (Signed)
Occupational Therapy Treatment Patient Details Name: Theodore Wiggins MRN: [id number] DOB: [birth year] Today's Date: 10/17/2017    History of present illness Pt is a 72 y.o. 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  PMH includes htn and vertigo.   OT comments  Pt seen for OT tx this date. Pt experiencing significant L knee pain, but pt agreeable to seated ADL tx with family present. Pt/family instructed in polar care mgt, falls prevention, home/routines modifications, DME/AE for toileting needs overnight and during the day, and pain mgt. Pt/family verbalized understanding. Pt limited by pain this session but anticipate pt will demo improved ADL and mobility independence with better pain control. Continue to progress towards OT goals.   Follow Up Recommendations  Supervision - Intermittent;No OT follow up    Equipment Recommendations  3 in 1 bedside commode    Recommendations for Other Services      Precautions / Restrictions Precautions Precautions: Fall Required Braces or Orthoses: Knee Immobilizer - Left Restrictions Weight Bearing Restrictions: Yes LLE Weight Bearing: Weight bearing as tolerated       Mobility Bed Mobility               General bed mobility comments: deferred, up in recliner  Transfers                 General transfer comment: pt declined 2/2 pain    Balance Overall balance assessment: Needs assistance Sitting-balance support: No upper extremity supported;Feet supported Sitting balance-Leahy Scale: Good                                    ADL either performed or assessed with clinical judgement   ADL Overall ADL's : Needs assistance/impaired             Lower Body Bathing: Sit to/from stand;Minimal assistance;With caregiver independent assisting       Lower Body Dressing: Sit to/from stand;Minimal assistance;With caregiver independent assisting Lower Body Dressing Details (indicate cue type and reason): pt/dtr/gf  educated in polar care mgt, AE for LB dressing                     Vision Baseline Vision/History: Wears glasses Wears Glasses: At all times Patient Visual Report: No change from baseline     Perception     Praxis      Cognition Arousal/Alertness: Awake/alert Behavior During Therapy: WFL for tasks assessed/performed Overall Cognitive Status: Within Functional Limits for tasks assessed                                          Exercises Other Exercises Other Exercises: Pt/dtr/gf educated in falls prevention strategies, home set up/mods to maximize safety   Shoulder Instructions       General Comments      Pertinent Vitals/ Pain       Pain Assessment: 0-10 Pain Score: 8  Pain Location: L knee at rest, up to 10/10 with movement Pain Descriptors / Indicators: Sore;Sharp Pain Intervention(s): Limited activity within patient's tolerance;Monitored during session;Repositioned;Ice applied;Premedicated before session  Home Living  Prior Functioning/Environment              Frequency  Min 1X/week        Progress Toward Goals  OT Goals(current goals can now be found in the care plan section)  Progress towards OT goals: OT to reassess next treatment  Acute Rehab OT Goals Patient Stated Goal: to go home and have less pain OT Goal Formulation: With patient/family Time For Goal Achievement: 10/30/17 Potential to Achieve Goals: Good  Plan Discharge plan remains appropriate;Frequency remains appropriate    Co-evaluation                 AM-PAC PT "6 Clicks" Daily Activity     Outcome Measure   Help from another person eating meals?: None Help from another person taking care of personal grooming?: None Help from another person toileting, which includes using toliet, bedpan, or urinal?: A Little Help from another person bathing (including washing, rinsing, drying)?: A  Little Help from another person to put on and taking off regular upper body clothing?: None Help from another person to put on and taking off regular lower body clothing?: A Little 6 Click Score: 21    End of Session    OT Visit Diagnosis: Other abnormalities of gait and mobility (R26.89);Pain Pain - Right/Left: Left Pain - part of body: Knee   Activity Tolerance Patient limited by pain   Patient Left in chair;with call bell/phone within reach;with chair alarm set;with family/visitor present;Other (comment)(polar care and L KI in place)   Nurse Communication          Time: [phone] OT Time Calculation (min): 20 min  Charges: OT General Charges $OT Visit: 1 Visit OT Treatments $Self Care/Home Management : 8-22 mins   Richrd Prime, MPH, MS, OTR/L ascom [phone] 10/17/17, 2:05 PM

## 2017-10-18 DIAGNOSIS — M1712 Unilateral primary osteoarthritis, left knee: Secondary | ICD-10-CM | POA: Diagnosis not present

## 2017-10-18 MED ORDER — OXYCODONE HCL 5 MG PO TABS: 5.0000 mg | ORAL_TABLET | ORAL | 0 refills | Status: DC | PRN

## 2017-10-18 MED ORDER — DOCUSATE SODIUM 100 MG PO CAPS: 100.0000 mg | ORAL_CAPSULE | Freq: Two times a day (BID) | ORAL | 1 refills | Status: DC

## 2017-10-18 MED ORDER — ENOXAPARIN SODIUM 30 MG/0.3ML ~~LOC~~ SOLN: 30.0000 mg | Freq: Two times a day (BID) | SUBCUTANEOUS | 0 refills | Status: DC

## 2017-10-18 MED ORDER — BISACODYL 5 MG PO TBEC: 10.0000 mg | DELAYED_RELEASE_TABLET | Freq: Every day | ORAL | 0 refills | Status: DC | PRN

## 2017-10-18 NOTE — Care Management Note (Signed)
Case Management Note  Patient Details  Name: MARLENE PFLUGER MRN: [id number] Date of Birth: 09-Apr-1945  Subjective/Objective: Met with daughter, Mariel Aloe ([phone]) at bedside) to discuss discharge planning. Patient lives alone but his children will be taking turns staying with him for the first week or so. He will need a walker. Ordered from [place] with [facility].   Provided a list of home health agencies. Referral to [facility] for HHPT.  TC to Dr. Mack Guise, clarified Lovenox order. [facility] # [facility]. Provided daughter with RNCM contact number in the event she has further questions.                       Action/Plan: [facility] for HHPT, [place] for walker, Lovenox called in.   Expected Discharge Date:                  Expected Discharge Plan:     In-House Referral:     Discharge planning Services  CM Consult  Post Acute Care Choice:  Durable Medical Equipment, Home Health Choice offered to:  Adult Children  DME Arranged:  Walker rolling DME Agency:  [place] Arranged:  PT [place] Agency:  [facility]  Status of Service:  In process, will continue to follow  If discussed at Long Length of Stay Meetings, dates discussed:    Additional Comments:  Jolly Mango, RN 10/18/2017, 12:01 PM

## 2017-10-18 NOTE — Progress Notes (Signed)
  Subjective:  POD #3 s/p left total knee arthroplasty.  patient reports left knee pain as mild.  Patient has had a bowel movement.  He has made good progress with physical therapy.  Patient has no complaints.  His family is at the bedside.  Objective:   VITALS:   Vitals:   10/17/17 2339 10/18/17 0500 10/18/17 0740 10/18/17 1056  BP: (!) 155/78  (!) 150/85 (!) 155/92  Pulse: 99  80   Resp: 19  18   Temp: (!) 101 F (38.3 C) 98.4 F (36.9 C) 97.7 F (36.5 C)   TempSrc: Oral Oral Oral   SpO2: 92%  97%   Weight:      Height:        PHYSICAL EXAM: Left lower extremity: Personally change the patient's dressing today.  His incision is clean dry and intact.  There is no erythema ecchymosis or significant swelling.  There is no active drainage. Neurovascular intact Sensation intact distally Intact pulses distally Dorsiflexion/Plantar flexion intact Incision: no drainage No cellulitis present Compartment soft  LABS  No results found for this or any previous visit (from the past 24 hour(s)).  No results found.  Assessment/Plan: 3 Days Post-Op   Active Problems:   History of total knee arthroplasty, left   Hx of total knee arthroplasty, left  Patient has made good progress with physical therapy.  He is for discharge home.  Patient's daughter was present and I reviewed the postop plan for home with the patient and his daughter in detail.  Patient may be weightbearing as tolerated on the left lower extremity.  He will use a walker for assistance with ambulation.  He will use his knee immobilizer at night to help with knee extension.  He should use his bone foam under his left ankle while sitting during the day out of his knee immobilizer.  Patient will use his TED stockings to help with her extremity swelling control for 2 weeks.  Patient will take Lovenox 40 mg subcu daily for DVT prophylaxis x4 weeks.  Will receive home health PT and will follow-up with me in the office on  10/25/2017.    Juanell FairlyKRASINSKI, Rachal Dvorsky , MD 10/18/2017, 3:32 PM

## 2017-10-18 NOTE — Discharge Summary (Signed)
Physician Discharge Summary  Patient ID: Theodore Wiggins MRN: [id number] DOB/AGE: September 21, 1945 72 y.o.  Admit date: 10/15/2017 Discharge date: 10/18/2017  Admission Diagnoses:  UNILATERAL PRIMARY OSTEARTHRITIS OF LEFT KNEE <principal problem not specified>  Discharge Diagnoses:  UNILATERAL PRIMARY OSTEARTHRITIS OF LEFT KNEE Active Problems:   History of total knee arthroplasty, left   Hx of total knee arthroplasty, left   Past Medical History:  Diagnosis Date  . Arthritis   . DDD (degenerative disc disease), lumbar   . Hypertension   . Vertigo     Surgeries: Procedure(s): TOTAL KNEE ARTHROPLASTY on 10/15/2017   Consultants (if any):   Discharged Condition: Improved  Hospital Course: Theodore Wiggins is an 72 y.o. male who was admitted 10/15/2017 with a diagnosis of  UNILATERAL PRIMARY OSTEARTHRITIS OF LEFT KNEE and went to the operating room on 10/15/2017 and underwent an uncomplicated left total knee arthroplasty.    He was given perioperative antibiotics:  Anti-infectives (From admission, onward)   Start     Dose/Rate Route Frequency Ordered Stop   10/15/17 1400  ceFAZolin (ANCEF) IVPB 1 g/50 mL premix     1 g 100 mL/hr over 30 Minutes Intravenous Every 6 hours 10/15/17 1213 10/15/17 1954   10/15/17 0617  clindamycin (CLEOCIN) 900 MG/50ML IVPB    Note to Pharmacy:  Rayann Heman   : cabinet override      10/15/17 0617 10/15/17 0804   10/15/17 0617  ceFAZolin (ANCEF) 2-4 GM/100ML-% IVPB    Note to Pharmacy:  Rayann Heman   : cabinet override      10/15/17 0617 10/15/17 0802   10/15/17 0600  ceFAZolin (ANCEF) IVPB 2g/100 mL premix     2 g 200 mL/hr over 30 Minutes Intravenous On call to O.R. 10/14/17 2148 10/15/17 0802   10/14/17 2200  clindamycin (CLEOCIN) IVPB 900 mg     900 mg 100 mL/hr over 30 Minutes Intravenous  Once 10/14/17 2148 10/15/17 0804    .  He was given sequential compression devices, early ambulation, and lovenox for DVT prophylaxis.  The  patient was admitted to the orthopedic floor following surgery.  He had his Foley catheter removed on postop day #1.  He received 24 hours of postop antibiotics.  His hemoglobin and hematocrit remained stable during his hospitalization.  Patient made good progress with physical therapy during his hospital stay.  On postop day #3 he was prepared for discharge home given his clinical improvement.  He benefited maximally from the hospital stay and there were no complications.    Recent vital signs:  Vitals:   10/18/17 0740 10/18/17 1056  BP: (!) 150/85 (!) 155/92  Pulse: 80   Resp: 18   Temp: 97.7 F (36.5 C)   SpO2: 97%     Recent laboratory studies:  Lab Results  Component Value Date   HGB 9.7 (L) 10/17/2017   HGB 11.3 (L) 10/16/2017   HGB 14.3 10/01/2017   Lab Results  Component Value Date   WBC 9.0 10/17/2017   PLT 169 10/17/2017   Lab Results  Component Value Date   INR 0.89 10/01/2017   Lab Results  Component Value Date   NA 130 (L) 10/16/2017   K 4.4 10/16/2017   CL 97 (L) 10/16/2017   CO2 26 10/16/2017   BUN 11 10/16/2017   CREATININE 0.93 10/16/2017   GLUCOSE 128 (H) 10/16/2017    Discharge Medications:   Allergies as of 10/18/2017   No Known Allergies  Medication List    STOP taking these medications   meloxicam 15 MG tablet Commonly known as:  MOBIC     TAKE these medications   ALKA-SELTZER ANTACID PO Take 1 tablet by mouth at bedtime.   bisacodyl 5 MG EC tablet Commonly known as:  DULCOLAX Take 2 tablets (10 mg total) by mouth daily as needed for moderate constipation.   cetirizine 10 MG tablet Commonly known as:  ZYRTEC Take 10 mg by mouth at bedtime.   [facility] DRY EYE RELIEF OP Place 2 drops into both eyes daily as needed (for dry eyes).   docusate sodium 100 MG capsule Commonly known as:  COLACE Take 1 capsule (100 mg total) by mouth 2 (two) times daily.   enoxaparin 30 MG/0.3ML injection Commonly known as:  LOVENOX Inject 0.3 mLs  (30 mg total) into the skin every 12 (twelve) hours.   Fish Oil 1000 MG Caps Take 1,000 mg by mouth 2 (two) times daily.   hydrochlorothiazide 25 MG tablet Commonly known as:  HYDRODIURIL Take 25 mg by mouth daily.   lisinopril 20 MG tablet Commonly known as:  PRINIVIL,ZESTRIL Take 20 mg by mouth daily.   lovastatin 40 MG tablet Commonly known as:  MEVACOR Take 40 mg by mouth at bedtime.   MULTIVITAMIN PO Take 1 tablet by mouth daily.   oxyCODONE 5 MG immediate release tablet Commonly known as:  Oxy IR/ROXICODONE Take 1-2 tablets (5-10 mg total) by mouth every 4 (four) hours as needed for moderate pain (pain score 4-6).       Diagnostic Studies: Chest 2 View  Result Date: 10/01/2017 CLINICAL DATA:  72 year old male preoperative study for left knee surgery. EXAM: CHEST - 2 VIEW COMPARISON:  No prior chest radiograph. FINDINGS: Lung volumes and mediastinal contours are within normal limits. Mild tortuosity of the thoracic aorta. Mild eventration of the diaphragm (normal variant). Visualized tracheal air column is within normal limits. Both lungs appear clear. No pneumothorax or pleural effusion. No acute osseous abnormality identified. Negative visible bowel gas pattern. IMPRESSION: No acute cardiopulmonary abnormality. Electronically Signed   By: Odessa FlemingH  Hall M.D.   On: 10/01/2017 15:33   Dg Knee Left Port  Result Date: 10/15/2017 CLINICAL DATA:  Postop left knee EXAM: PORTABLE LEFT KNEE - 1-2 VIEW COMPARISON:  MR left knee of 07/12/2016 FINDINGS: Views of the left knee show the femoral and tibial components of the left total knee replaced and to be in good position with normal alignment. Some air is noted in the soft tissues and joint space postoperatively. No complicating features are seen. IMPRESSION: Left total knee replacement components in good position. No complicating features. Electronically Signed   By: Dwyane DeePaul  Barry M.D.   On: 10/15/2017 11:44    Disposition: Discharge  disposition: 01-Home or Self Care       Discharge Instructions    Call MD / Call 911   Complete by:  As directed    If you experience chest pain or shortness of breath, CALL 911 and be transported to the hospital emergency room.  If you develope a fever above 101 F, pus (white drainage) or increased drainage or redness at the wound, or calf pain, call your surgeon's office.   Constipation Prevention   Complete by:  As directed    Drink plenty of fluids.  Prune juice may be helpful.  You may use a stool softener, such as Colace (over the counter) 100 mg twice a day.  Use MiraLax (over the counter)  for constipation as needed.   Diet general   Complete by:  As directed    Discharge instructions   Complete by:  As directed    Patient may be weightbearing as tolerated on the left lower extremity.  He will use a walker for assistance with ambulation.  He will use his knee immobilizer at night to help with knee extension.  He should use his bone foam under his left ankle while sitting during the day out of his knee immobilizer to extend his left knee.  Patient will use his TED stockings to help with her extremity swelling control for 2 weeks.  He will remove the stockings at night.  He will continue to use the polar care as needed to reduce left knee swelling.  Patient will take Lovenox 40 mg subcu daily for DVT prophylaxis x4 weeks for blood clot prevention.  He will receive home health PT and will follow-up with me in the office on 10/25/2017.   Driving restrictions   Complete by:  As directed    No driving for until follow up with Dr. Martha Clan   Increase activity slowly as tolerated   Complete by:  As directed    Lifting restrictions   Complete by:  As directed    No lifting for 12-16 weeks         Signed: Juanell Fairly ,MD 10/18/2017, 3:43 PM

## 2017-10-18 NOTE — Discharge Planning (Signed)
Patient IV removed.  RN assessment and VS revealed stability for [facility].  Discharge papers given, explained and educated. Discussed s/sx of infection and/or complications and when to contact Dr, office.  Informed of suggested FU appt and appt made. 1130 9/20 in Dr. Martha ClanKrasinski office.  Scripts printed, signed and given.  Educated via [place] on given self Lovenox shot. PO pain med given just prior to DC.  Once ready, will be wheeled to front and family transporting home via car.

## 2017-10-18 NOTE — Progress Notes (Signed)
Physical Therapy Treatment Patient Details Name: Theodore Wiggins MRN: [id number] DOB: 1945-09-24 Today's Date: 10/18/2017    History of Present Illness Pt is a 72 y.o. 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  PMH includes htn and vertigo.    PT Comments     Pt demonstrating improvements in ambulation, independence with exercises, and stair negotiation. Pt able to complete 10 AROM SLR with good quality of motion and limited increase in pain. Pt ambulated 250 feet with no knee immobilizer and no instances of knee buckling. Decreased UE reliance required, improved cadence, and consistent step through during gait. Pt required +1 min assist for stair ambulation and limited support of walker but continued close supervision. Pt remains appropriate for HHPT as safest and most appropriate recommendation.    Follow Up Recommendations  Home health PT     Equipment Recommendations  Rolling walker with 5" wheels    Recommendations for Other Services       Precautions / Restrictions Precautions Precautions: Fall Restrictions Weight Bearing Restrictions: Yes LLE Weight Bearing: Weight bearing as tolerated    Mobility  Bed Mobility Overal bed mobility: Modified Independent Bed Mobility: Supine to Sit     Supine to sit: Supervision;Modified independent (Device/Increase time)     General bed mobility comments: compensatory strategy used with R leg to lift L leg off of bed  Transfers Overall transfer level: Needs assistance Equipment used: Rolling walker (2 wheeled) Transfers: Sit to/from Stand Sit to Stand: Supervision            Ambulation/Gait Ambulation/Gait assistance: Min guard Gait Distance (Feet): 250 Feet Assistive device: Rolling walker (2 wheeled) Gait Pattern/deviations: Antalgic     General Gait Details: no use of knee immbilizer with no knee buckling noted, improved cadence with consistent step through gait pattern, mild pain increase with increased WBing on L, pt  continues to require moderate UE reliance   Stairs Stairs: Yes Stairs assistance: Min guard Stair Management: With walker;Backwards   General stair comments: Significant improvements in stair negotiation, only 1 min guard assist required, decreased support of walker by PT required, good coordination of walker on stairs with only min VC required for placement   Wheelchair Mobility    Modified Rankin (Stroke Patients Only)       Balance Overall balance assessment: Modified Independent Sitting-balance support: No upper extremity supported;Feet supported Sitting balance-Leahy Scale: Good         Standing balance comment: no overt LOB pt reliant on walker                            Cognition Arousal/Alertness: Awake/alert(pt with some scattered communication) Behavior During Therapy: WFL for tasks assessed/performed Overall Cognitive Status: Within Functional Limits for tasks assessed                                        Exercises Total Joint Exercises Quad Sets: AROM;Strengthening;Left;10 reps Short Arc Quad: Left;10 reps;AROM;AAROM(5 with assist 5 without assist) Hip ABduction/ADduction: AROM;Strengthening;Left;10 reps Straight Leg Raises: Left;15 reps;AROM(5AAROM, 10 AROM ) Goniometric ROM: L knee supine 3-96   General Comments General comments (skin integrity, edema, etc.): polar care applied end of session      Pertinent Vitals/Pain Pain Assessment: 0-10 Pain Score: 7  Pain Location: L knee at rest, pain medication given at beginning of session Pain Descriptors / Indicators:  Sore Pain Intervention(s): Limited activity within patient's tolerance;Monitored during session;Patient requesting pain meds-RN notified;Ice applied    Home Living                      Prior Function            PT Goals (current goals can now be found in the care plan section) Progress towards PT goals: Progressing toward goals     Frequency    BID      PT Plan Current plan remains appropriate    Co-evaluation              AM-PAC PT "6 Clicks" Daily Activity  Outcome Measure  Difficulty turning over in bed (including adjusting bedclothes, sheets and blankets)?: A Little Difficulty moving from lying on back to sitting on the side of the bed? : A Little Difficulty sitting down on and standing up from a chair with arms (e.g., wheelchair, bedside commode, etc,.)?: A Little Help needed moving to and from a bed to chair (including a wheelchair)?: None Help needed walking in hospital room?: A Little Help needed climbing 3-5 steps with a railing? : A Little 6 Click Score: 19    End of Session Equipment Utilized During Treatment: Gait belt Activity Tolerance: Patient tolerated treatment well Patient left: with call bell/phone within reach;with chair alarm set;in chair   PT Visit Diagnosis: Other abnormalities of gait and mobility (R26.89);Muscle weakness (generalized) (M62.81);Difficulty in walking, not elsewhere classified (R26.2);Pain Pain - Right/Left: Left Pain - part of body: Knee     Time: 2956-2130 PT Time Calculation (min) (ACUTE ONLY): 30 min  Charges:                        Mickel Duhamel, SPT 10/18/2017, 10:48 AM

## 2017-11-04 IMAGING — MR MR KNEE*L* W/O CM
6 series · 39 of 40 positions shown · non-contrast
Comparison: None.

CLINICAL DATA: Fell 2 months ago. No surgery. Swelling. Fluid
removes. Pain medial. Several cortisone injections. Worse walking.
Not improving

EXAM:
MRI OF THE LEFT KNEE WITHOUT CONTRAST
TECHNIQUE: Multiplanar, multisequence MR imaging of the knee was performed. No
intravenous contrast was administered.

[Series 3: PD fat-sat · axial · 3.0mm · 0.35mm/px · z∈[-97,+21]mm · 8 of 37 slices shown (1 of 4)]
[im 1/37]
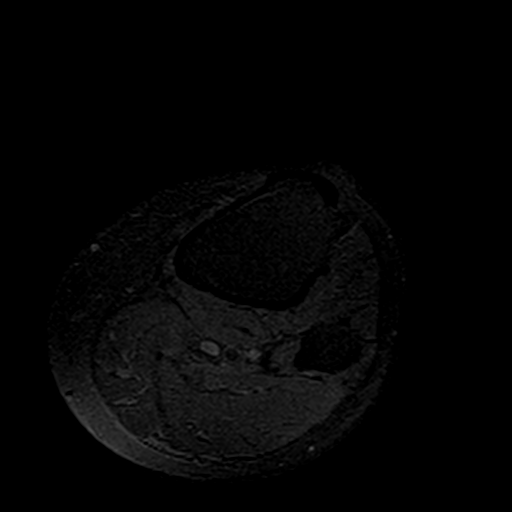
[im 6/37]
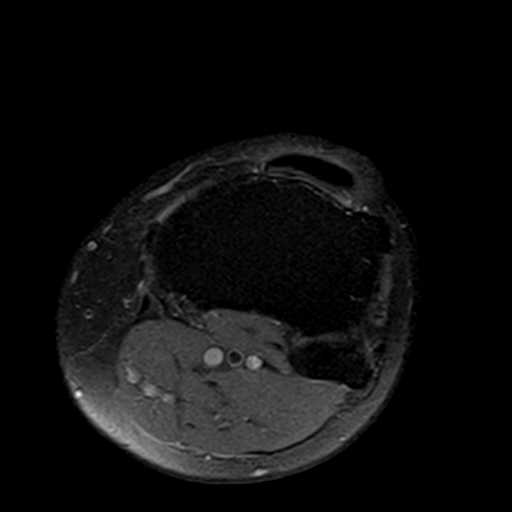
[im 11/37]
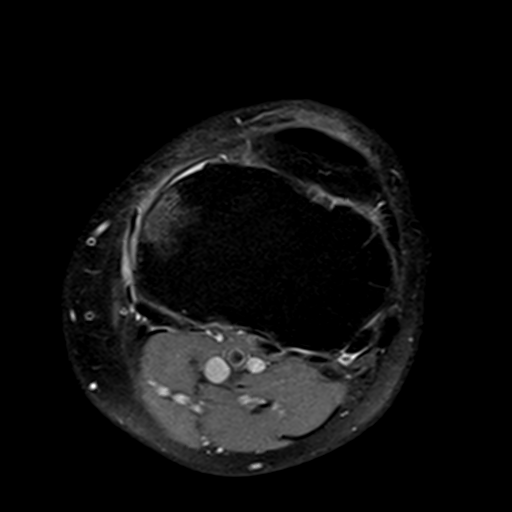
[im 16/37]
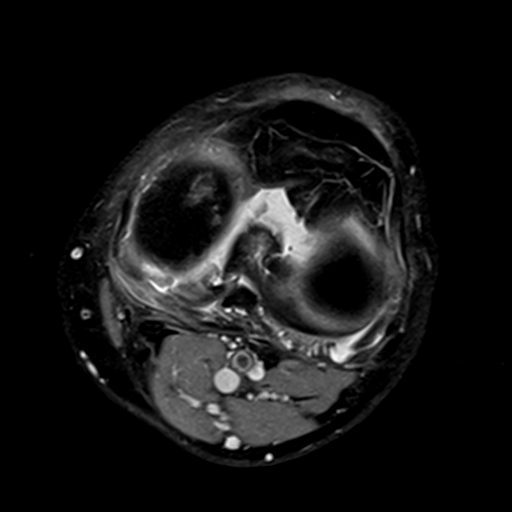
[im 21/37]
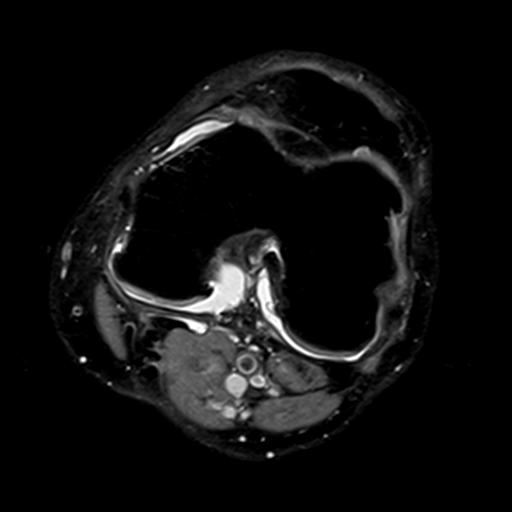
[im 26/37]
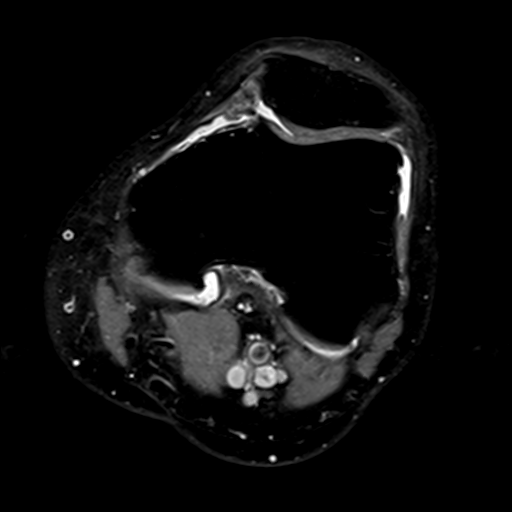
[im 31/37]
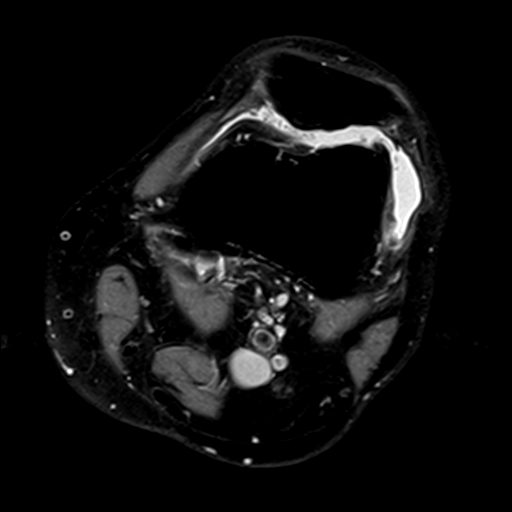
[im 37/37]
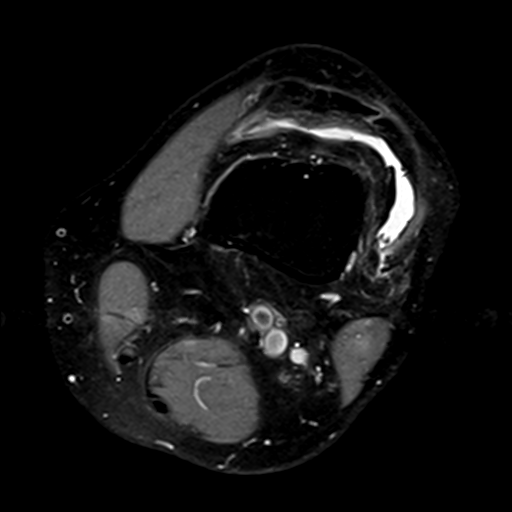

[Series 4: T1 · coronal · 3.0mm · 0.53mm/px · 6 of 37 slices shown]
[im 1/37]
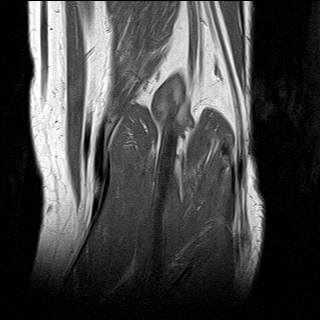
[im 7/37]
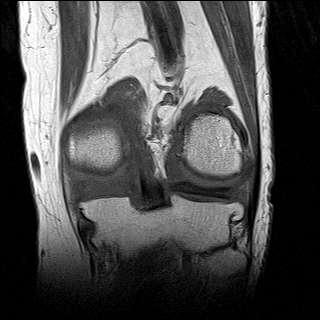
[im 13/37]
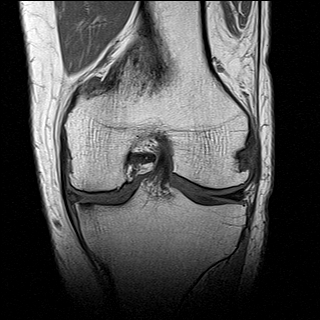
[im 19/37]
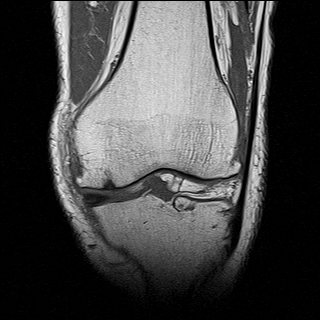
[im 25/37]
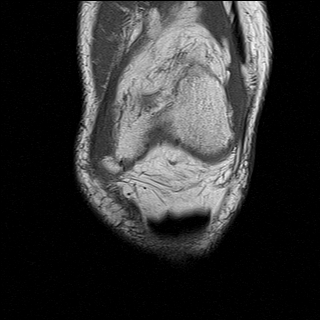
[im 31/37]
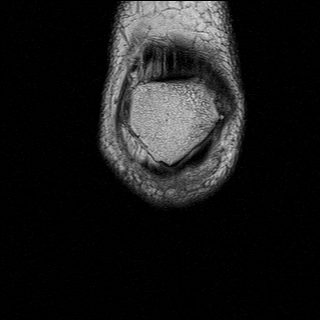

[Series 5: T2 fat-sat · coronal · 3.0mm · 0.50mm/px · 7 of 37 slices shown]
[im 1/37]
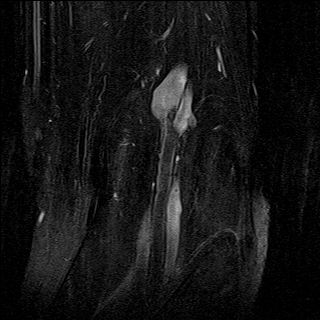
[im 7/37]
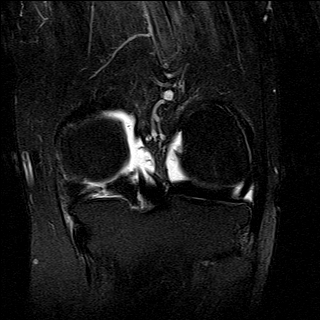
[im 13/37]
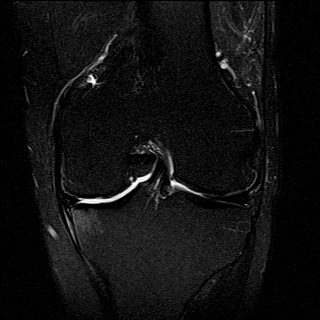
[im 19/37]
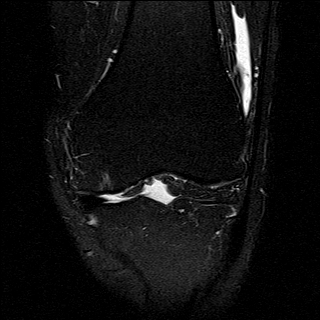
[im 25/37]
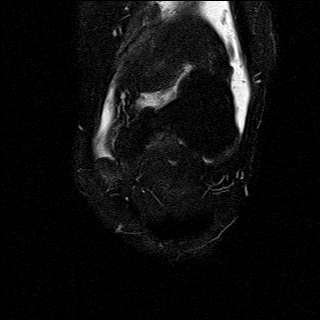
[im 31/37]
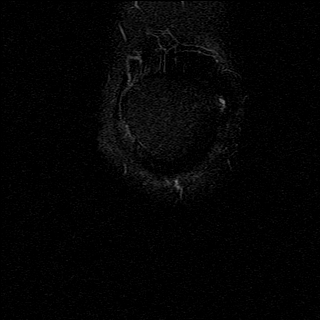
[im 37/37]
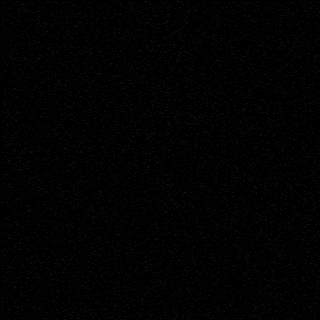

[Series 6: PD fat-sat · coronal · 3.0mm · 0.62mm/px · 7 of 37 slices shown (2 of 4)]
[im 1/37]
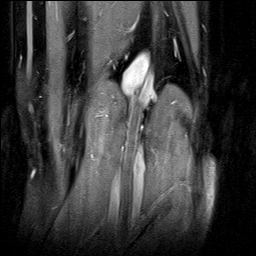
[im 7/37]
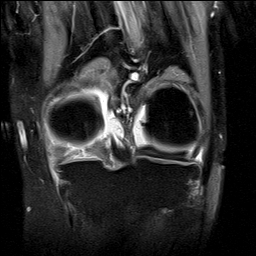
[im 13/37]
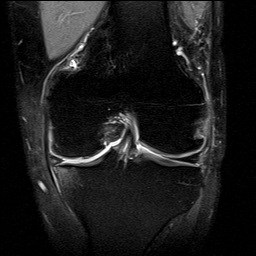
[im 19/37]
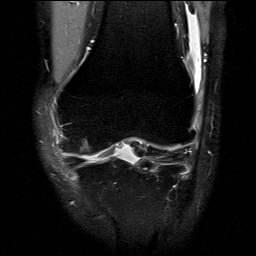
[im 25/37]
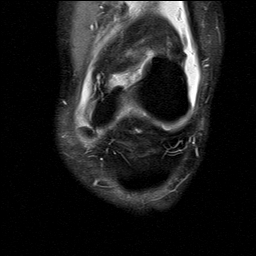
[im 31/37]
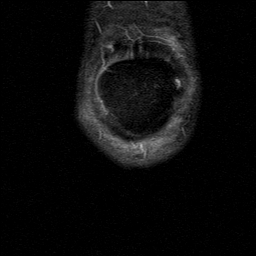
[im 37/37]
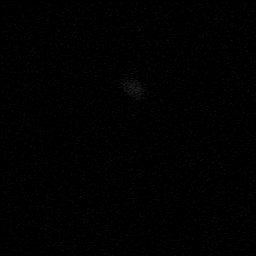

[Series 7: PD fat-sat · sagittal · 3.0mm · 0.66mm/px · 7 of 36 slices shown (3 of 4)]
[im 1/36]
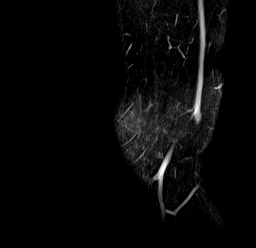
[im 6/36]
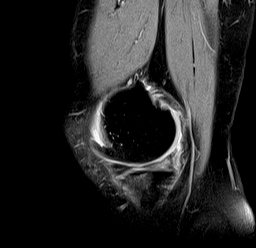
[im 12/36]
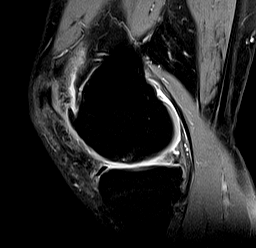
[im 18/36]
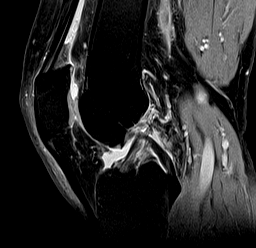
[im 24/36]
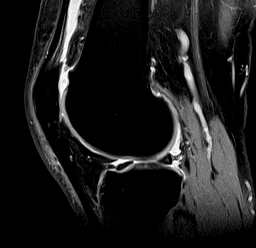
[im 30/36]
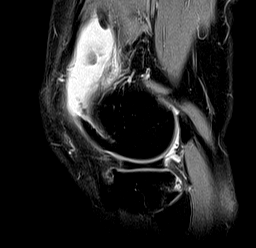
[im 36/36]
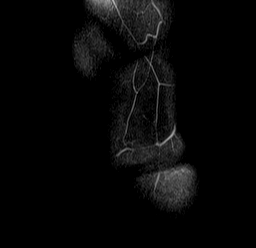

[Series 8: PD fat-sat · coronal · 2.0mm · 0.62mm/px · 4 of 19 slices shown (4 of 4)]
[im 1/19]
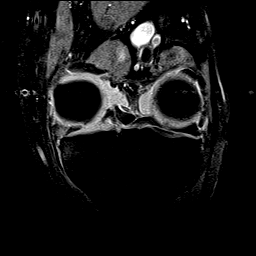
[im 7/19]
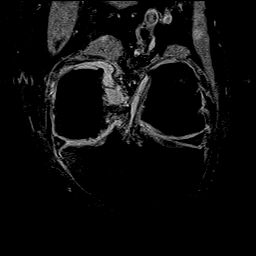
[im 13/19]
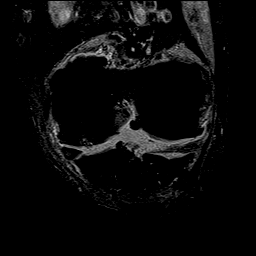
[im 19/19]
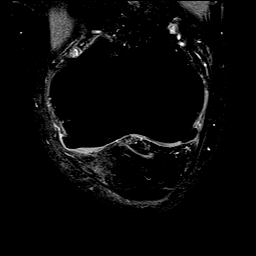

[39 of 40 positions shown; findings below may reference images not displayed]

FINDINGS: MENISCI

Medial meniscus: Radial tear of the posterior horn of the medial
meniscus with peripheral meniscal extrusion.

Lateral meniscus:  Intact.

LIGAMENTS

Cruciates:  Intact ACL and PCL.

Collaterals: Medial collateral ligament is intact. Lateral
collateral ligament complex is intact.

CARTILAGE

Patellofemoral: High-grade partial-thickness cartilage loss with
areas of full-thickness cartilage loss of the medial and lateral
patellar facets. Partial-thickness cartilage loss of the trochlea.

Medial: High-grade partial-thickness cartilage loss with areas of
full-thickness cartilage loss of the medial femoral condyles and to
lesser the medial tibial plateau.

Lateral: Partial-thickness cartilage loss of the lateral femoral
condyle.

Joint: Moderate joint effusion. Normal Hoffa's fat. No plical
thickening.

Popliteal Fossa:  No Baker cyst.  Intact popliteus tendon.

Extensor Mechanism:  Intact quadriceps tendon and patellar tendon.

Bones: Subchondral linear signal abnormality in the periphery of the
medial tibial plateau with surrounding marrow edema most concerning
for an insufficiency fracture.

Other: No fluid collection or hematoma.
IMPRESSION: 1. Nondepressed, nondisplaced subchondral insufficiency fracture of
the medial tibial plateau with surrounding marrow edema.
2. Radial tear of the posterior horn of the medial meniscus with
peripheral meniscal extrusion.
3. Tricompartmental cartilage abnormalities as described above most
severe in the medial femorotibial compartment.
4. Moderate joint effusion.

## 2019-04-23 ENCOUNTER — Other Ambulatory Visit: Payer: Self-pay | Admitting: Internal Medicine

## 2019-04-23 DIAGNOSIS — R42 Dizziness and giddiness: Secondary | ICD-10-CM

## 2019-05-01 ENCOUNTER — Ambulatory Visit
Admission: RE | Admit: 2019-05-01 | Discharge: 2019-05-01 | Disposition: A | Discharge status: Home / Self Care | Payer: Medicare PPO | Source: Ambulatory Visit | Attending: Internal Medicine | Admitting: Internal Medicine

## 2019-05-01 ENCOUNTER — Encounter (INDEPENDENT_AMBULATORY_CARE_PROVIDER_SITE_OTHER): Payer: Self-pay

## 2019-05-01 ENCOUNTER — Other Ambulatory Visit: Payer: Self-pay

## 2019-05-01 DIAGNOSIS — R42 Dizziness and giddiness: Secondary | ICD-10-CM

## 2019-09-16 ENCOUNTER — Ambulatory Visit
Admission: EM | Admit: 2019-09-16 | Discharge: 2019-09-16 | Disposition: A | Discharge status: Home / Self Care | Payer: Medicare PPO | Attending: Family Medicine | Admitting: Family Medicine

## 2019-09-16 ENCOUNTER — Other Ambulatory Visit: Payer: Self-pay

## 2019-09-16 DIAGNOSIS — B029 Zoster without complications: Secondary | ICD-10-CM | POA: Diagnosis not present

## 2019-09-16 MED ORDER — TRAMADOL HCL 50 MG PO TABS: 50.0000 mg | ORAL_TABLET | Freq: Two times a day (BID) | ORAL | 0 refills | Status: DC | PRN

## 2019-09-16 MED ORDER — VALACYCLOVIR HCL 1 G PO TABS: 1000.0000 mg | ORAL_TABLET | Freq: Three times a day (TID) | ORAL | 0 refills | Status: DC

## 2019-09-16 NOTE — ED Provider Notes (Signed)
[facility]    CSN: [id number] Arrival date & time: 09/16/19  1559  History   Chief Complaint Chief Complaint  Patient presents with  . Rash    ?Shingles   HPI   74 year old male presents with rash.  Patient reports that his rash started at the end of last week.  He reports painful rash on his right low back, right buttock, and right groin.  He states that it is painful.  He has been applying topical hydrocortisone without resolution.  Patient states that he has recently found mice in his house and is unsure if this is contributing.  The rash is raised.  No fever.  No other reported symptoms.  No other complaints.  Past Medical History:  Diagnosis Date  . Arthritis   . DDD (degenerative disc disease), lumbar   . Hypertension   . Vertigo    Patient Active Problem List   Diagnosis Date Noted  . 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 Hx of total knee arthroplasty, left 10/15/2017    Past Surgical History:  Procedure Laterality Date  . CARDIOVASCULAR STRESS TEST    . CATARACT EXTRACTION, BILATERAL    . TONSILLECTOMY    . TOTAL KNEE ARTHROPLASTY Left 10/15/2017   Procedure: TOTAL KNEE ARTHROPLASTY;  Surgeon: Juanell Fairly, MD;  Location: [facility] ORS;  Service: Orthopedics;  Laterality: Left;       Home Medications    Prior to Admission medications   Medication Sig Start Date End Date Taking? Authorizing Provider  bisacodyl (DULCOLAX) 5 MG EC tablet Take 2 tablets (10 mg total) by mouth daily as needed for moderate constipation. 10/18/17  Yes Juanell Fairly, MD  Calcium Carbonate Antacid (ALKA-SELTZER ANTACID PO) Take 1 tablet by mouth at bedtime.   Yes [provider]  cetirizine (ZYRTEC) 10 MG tablet Take 10 mg by mouth at bedtime.    Yes [provider]  docusate sodium (COLACE) 100 MG capsule Take 1 capsule (100 mg total) by mouth 2 (two) times daily. 10/18/17  Yes Juanell Fairly, MD  enoxaparin (LOVENOX) 30 MG/0.3ML  injection Inject 0.3 mLs (30 mg total) into the skin every 12 (twelve) hours. 10/18/17  Yes Juanell Fairly, MD  Glycerin-Hypromellose-PEG 400 ([facility] DRY EYE RELIEF OP) Place 2 drops into both eyes daily as needed (for dry eyes).   Yes [provider]  hydrochlorothiazide (HYDRODIURIL) 25 MG tablet Take 25 mg by mouth daily.   Yes [provider]  lisinopril (PRINIVIL,ZESTRIL) 20 MG tablet Take 20 mg by mouth daily.  04/30/16  Yes [provider]  lovastatin (MEVACOR) 40 MG tablet Take 40 mg by mouth at bedtime.  08/24/09  Yes [provider]  Multiple Vitamins-Minerals (MULTIVITAMIN PO) Take 1 tablet by mouth daily.   Yes [provider]  Omega-3 Fatty Acids (FISH OIL) 1000 MG CAPS Take 1,000 mg by mouth 2 (two) times daily.  12/27/10  Yes [provider]  oxyCODONE (OXY IR/ROXICODONE) 5 MG immediate release tablet Take 1-2 tablets (5-10 mg total) by mouth every 4 (four) hours as needed for moderate pain (pain score 4-6). 10/18/17  Yes Juanell Fairly, MD  traMADol (ULTRAM) 50 MG tablet Take 1 tablet (50 mg total) by mouth every 12 (twelve) hours as needed. 09/16/19   Tommie Sams, DO  valACYclovir (VALTREX) 1000 MG tablet Take 1 tablet (1,000 mg total) by mouth 3 (three) times daily. 09/16/19   Tommie Sams, DO    Family History Family History  Problem Relation Age of Onset  . CVA Mother   . Brain cancer Mother   . Heart attack Father 69  . Diabetes Brother 65    Social History Social History   Tobacco Use  . Smoking status: Never Smoker  . Smokeless tobacco: Current User    Types: Snuff  Vaping Use  . Vaping Use: Never used  Substance Use Topics  . Alcohol use: Never    Alcohol/week: 0.0 standard drinks  . Drug use: Never     Allergies   Patient has no known allergies.   Review of Systems Review of Systems  Constitutional: Negative for fever.  Skin: Positive for rash.   Physical Exam Triage Vital Signs ED Triage  Vitals  Enc Vitals Group     BP 09/16/19 1643 (!) 176/106     Pulse Rate 09/16/19 1638 83     Resp 09/16/19 1638 18     Temp 09/16/19 1638 98 F (36.7 C)     Temp Source 09/16/19 1638 Oral     SpO2 09/16/19 1638 99 %     Weight --      Height --      Head Circumference --      Peak Flow --      Pain Score 09/16/19 1632 0     Pain Loc --      Pain Edu? --      Excl. in GC? --    Updated Vital Signs BP (!) 176/106 (BP Location: Right Arm)   Pulse 83   Temp 98 F (36.7 C) (Oral)   Resp 18   SpO2 99%   Visual Acuity Right Eye Distance:   Left Eye Distance:   Bilateral Distance:    Right Eye Near:   Left Eye Near:    Bilateral Near:     Physical Exam Vitals and nursing note reviewed.  Constitutional:      General: He is not in acute distress.    Appearance: Normal appearance. He is not ill-appearing.  HENT:     Head: Normocephalic and atraumatic.  Eyes:     General:        Right eye: No discharge.        Left eye: No discharge.     Conjunctiva/sclera: Conjunctivae normal.  Pulmonary:     Effort: Pulmonary effort is normal. No respiratory distress.  Skin:         Comments: Erythematous, vesicular rash noted at the level location.  Fits a dermatomal distribution.  Neurological:     Mental Status: He is alert.  Psychiatric:        Mood and Affect: Mood normal.        Behavior: Behavior normal.     UC Treatments / Results  Labs (all labs ordered are listed, but only abnormal results are displayed) Labs Reviewed - No data to display  EKG   Radiology No results found.  Procedures Procedures (including critical care time)  Medications Ordered in UC Medications - No data to display  Initial Impression / Assessment and Plan / UC Course  I have reviewed the triage vital signs and the nursing notes.  Pertinent labs & imaging results that were available during my care of the patient were reviewed by me and considered in my medical decision making (see  chart for details).    74 year old male presents with herpes zoster.  Treating with tramadol and valacyclovir.  Final Clinical Impressions(s) / UC Diagnoses   Final diagnoses:  Herpes zoster without complication   Discharge Instructions   None    ED Prescriptions    Medication Sig Dispense Auth. Provider   traMADol (ULTRAM) 50 MG tablet Take 1 tablet (50 mg total) by mouth every 12 (twelve) hours as needed. 10 tablet Kourtlynn Trevor G, DO   valACYclovir (VALTREX) 1000 MG tablet Take 1 tablet (1,000 mg total) by mouth 3 (three) times daily. 21 tablet Everlene Other G, DO     I have reviewed the PDMP during this encounter.   Tommie Sams, [place] 09/16/19 2102

## 2019-09-16 NOTE — ED Triage Notes (Signed)
Patient in today w/ painful rash on right buttock and right groin. Patient said he noticed the start of it end of last week sometime. Now it has spread and worsened.

## 2020-04-29 ENCOUNTER — Encounter: Payer: Self-pay | Admitting: Emergency Medicine

## 2020-04-29 ENCOUNTER — Other Ambulatory Visit: Payer: Self-pay

## 2020-04-29 ENCOUNTER — Emergency Department: Payer: Medicare PPO

## 2020-04-29 ENCOUNTER — Emergency Department
Admission: EM | Admit: 2020-04-29 | Discharge: 2020-04-29 | Disposition: A | Discharge status: Home / Self Care | Payer: Medicare PPO | Attending: Emergency Medicine | Admitting: Emergency Medicine

## 2020-04-29 DIAGNOSIS — Z79899 Other long term (current) drug therapy: Secondary | ICD-10-CM | POA: Diagnosis not present

## 2020-04-29 DIAGNOSIS — J705 Respiratory conditions due to smoke inhalation: Secondary | ICD-10-CM | POA: Insufficient documentation

## 2020-04-29 DIAGNOSIS — Y9271 Barn as the place of occurrence of the external cause: Secondary | ICD-10-CM | POA: Diagnosis not present

## 2020-04-29 DIAGNOSIS — T59811A Toxic effect of smoke, accidental (unintentional), initial encounter: Secondary | ICD-10-CM

## 2020-04-29 DIAGNOSIS — Z96652 Presence of left artificial knee joint: Secondary | ICD-10-CM | POA: Diagnosis not present

## 2020-04-29 DIAGNOSIS — I10 Essential (primary) hypertension: Secondary | ICD-10-CM | POA: Diagnosis not present

## 2020-04-29 LAB — COOXEMETRY PANEL
Carboxyhemoglobin: 1.8 % — ABNORMAL HIGH (ref 0.5–1.5)
Methemoglobin: 0.8 % (ref 0.0–1.5)
O2 Saturation: 97.1 %
Total hemoglobin: 14.2 g/dL (ref 12.0–16.0)

## 2020-04-29 MED ORDER — ALBUTEROL SULFATE HFA 108 (90 BASE) MCG/ACT IN AERS: 2.0000 | INHALATION_SPRAY | Freq: Four times a day (QID) | RESPIRATORY_TRACT | 2 refills | Status: DC | PRN
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
## 2020-04-29 NOTE — Discharge Instructions (Signed)
Please seek medical attention for any worsening shortness of breath, chest pain, bloody cough, high fevers or any other new or concerning symptoms.
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
## 2020-05-31 ENCOUNTER — Ambulatory Visit: Payer: Self-pay | Admitting: General Surgery

## 2020-05-31 NOTE — H&P (Signed)
PATIENT PROFILE: Theodore Wiggins is a 75 y.o. male who presents to the Clinic for consultation at the request of Dr. Edwina Barth for evaluation of umbilical hernia.  PCP:  Velna Ochs, MD  HISTORY OF PRESENT ILLNESS: Mr. Theodore Wiggins reports having umbilical hernia since a year ago.  He reported that he has been having pain in the periumbilical area.  The pain mostly supraumbilically.  The pain does not radiate to other part of the body.  Pain is aggravated by applying pressure on the area and by coughing.  There has been no alleviating factors.  Patient reports cough is due to hiatal hernia.  He control his cough with Alka-Seltzer.  Denies any chest pain or shortness of breath.   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 Positive for cough.  Cardiovascular ROS: no chest pain or palpitations GI ROS: negative for nausea, vomiting, abdominal pain, diarrhea, constipation Musculoskeletal ROS: negative for - joint swelling or muscle pain Neurological ROS: negative for - confusion, syncope Dermatological ROS: negative for pruritus and rash Psychiatric: negative for anxiety, depression, difficulty sleeping and memory loss  MEDICATIONS: Current Outpatient Medications  Medication Sig Dispense Refill  . amLODIPine (NORVASC) 5 MG tablet TAKE 1 TABLET BY  MOUTH EVERY DAY 90 tablet 1  . aspirin/sod bicarb/citric acid (ALKA-SELTZER ORAL) Take by mouth    . cetirizine (ZYRTEC) 10 MG tablet Take 10 mg by mouth once daily    . lisinopriL (ZESTRIL) 30 MG tablet Take 1 tablet (30 mg total) by mouth once daily 90 tablet 1  . lovastatin (MEVACOR) 40 MG tablet TAKE 1 TABLET BY MOUTH DAILY WITH DINNER 90 tablet 1  . meloxicam (MOBIC) 15 MG tablet TAKE 1 TABLET BY MOUTH EVERY DAY (Patient taking differently: Every other day) 90 tablet 3  . multivit-mins/iron/folic/lycop (COMPLETE MEN ORAL) Take by mouth    . omega 3-dha-epa-fish oil 1,000 mg (120 mg-180 mg) Cap Take by mouth 2 (two) times daily       . predniSONE (DELTASONE) 20 MG tablet 3qam for 5 days, 2qam for 5 days, 1qam for 5 days 30 tablet 0  . terbinafine HCL (LAMISIL) 250 mg tablet Take 1 tablet (250 mg total) by mouth once daily 30 tablet 1  . sildenafiL (VIAGRA) 100 MG tablet Take 1 tablet (100 mg total) by mouth once daily as needed for Erectile Dysfunction for up to 30 days 10 tablet 3   No current facility-administered medications for this visit.    ALLERGIES: Gabapentin  PAST MEDICAL HISTORY: No past medical history on file.  PAST SURGICAL HISTORY: No past surgical history on file.   FAMILY HISTORY: No family history on file.   SOCIAL HISTORY: Social History   Socioeconomic History  . Marital status: Widowed  Tobacco Use  . Smoking status: Never Smoker  . Smokeless tobacco: Current User  Types: Snuff    PHYSICAL EXAM: Vitals:   05/31/20 1349  BP: (!) 145/88  Pulse: 87   Body mass index is 33.08 kg/m. Weight: (!) 101.6 kg (224 lb)   GENERAL: Alert, active, oriented x3  HEENT: Pupils equal reactive to light. Extraocular movements are intact. Sclera clear. Palpebral conjunctiva normal red color.Pharynx clear.  NECK: Supple with no palpable mass and no adenopathy.  LUNGS: Sound clear with no rales rhonchi or wheezes.  HEART: Regular rhythm S1 and S2 without  murmur.  ABDOMEN: Soft and depressible, nontender with no palpable mass, no hepatomegaly. Wounds dry and clean.  EXTREMITIES: Well-developed well-nourished symmetrical with no dependent edema.  NEUROLOGICAL: Awake alert oriented, facial expression symmetrical, moving all extremities.  REVIEW OF DATA: I have reviewed the following data today: Appointment on 05/23/2020  Component Date Value  . Glucose 05/23/2020 106   . Sodium 05/23/2020 136   . Potassium 05/23/2020 4.3   . Chloride 05/23/2020 102   . Carbon Dioxide (CO2) 05/23/2020 27.7   . Urea Nitrogen (BUN) 05/23/2020 14   . Creatinine 05/23/2020 1.0   . Glomerular Filtration Ra* 05/23/2020 73   . Calcium 05/23/2020 9.5   . AST  05/23/2020 20   . ALT  05/23/2020 23   . Alk Phos (alkaline Phosp* 05/23/2020 55   . Albumin 05/23/2020 4.5   . Bilirubin, Total 05/23/2020 0.3   . Protein, Total 05/23/2020 7.0   . A/G Ratio 05/23/2020 1.8   . WBC (White Blood Cell Co* 05/23/2020 6.2   . RBC (Red Blood Cell Coun* 05/23/2020 4.39 (!)  . Hemoglobin 05/23/2020 14.4   . Hematocrit 05/23/2020 41.1   . MCV (Mean Corpuscular Vo* 05/23/2020 93.6   . MCH (Mean Corpuscular He* 05/23/2020 32.8 (!)  . MCHC (Mean Corpuscular H* 05/23/2020 35.0   . Platelet Count 05/23/2020 242   . RDW-CV (Red Cell Distrib* 05/23/2020 12.2   . MPV (Mean Platelet Volum* 05/23/2020 9.1 (!)  . Neutrophils 05/23/2020 2.92   . Lymphocytes 05/23/2020 2.39   . Monocytes 05/23/2020 0.60   . Eosinophils 05/23/2020 0.18   . Basophils 05/23/2020 0.05   . Neutrophil % 05/23/2020 47.4   . Lymphocyte % 05/23/2020 38.9   . Monocyte % 05/23/2020 9.8   . Eosinophil % 05/23/2020 2.9   . Basophil% 05/23/2020 0.8   . Immature Granulocyte % 05/23/2020 0.2   . Immature Granulocyte Cou* 05/23/2020 0.01      ASSESSMENT: Theodore Wiggins is a 75 y.o. male presenting for consultation for umbilical hernia.    The patient presents with a symptomatic, reducible inguinal hernia.  Patient was oriented about the diagnosis of inguinal hernia and its implication. The patient was oriented about the treatment alternatives (observation vs surgical repair). Due to patient symptoms, repair is recommended. Patient oriented about the surgical procedure, the use of mesh and its risk of complications such as: infection, bleeding, injury to vas deference, vasculature and testicle, injury to bowel or bladder, and chronic pain.   I will optimize his reflux medication to improve his cough.  This will help with postop pain management and reduce the risk of hernia recurrence.  Umbilical hernia, incarcerated [K42.0]  PLAN: 1. Robotic assisted laparoscopic umbilical hernia repair with mesh ([id number]) 2. I will discuss with Dr. Edwina Barth for medical alternatives to control reflux and cough before surgery since you will need to stop the Alka Seltzer and Aspirin for 5 days before surgery 3. CBC, CMP (done) 4. Contact us if you have  any concern.   Patient verbalized understanding, all questions were answered, and were agreeable with the plan outlined above.    Herbert Pun, MD  Electronically signed by Herbert Pun, MD

## 2020-05-31 NOTE — H&P (View-Only) (Signed)
PATIENT PROFILE: Theodore Wiggins is a 75 y.o. male who presents to the Clinic for consultation at the request of Dr. Edwina Barth for evaluation of umbilical hernia.  PCP:  Velna Ochs, MD  HISTORY OF PRESENT ILLNESS: Theodore Wiggins reports having umbilical hernia since a year ago.  He reported that he has been having pain in the periumbilical area.  The pain mostly supraumbilically.  The pain does not radiate to other part of the body.  Pain is aggravated by applying pressure on the area and by coughing.  There has been no alleviating factors.  Patient reports cough is due to hiatal hernia.  He control his cough with Alka-Seltzer.  Denies any chest pain or shortness of breath.   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 Positive for cough.  Cardiovascular ROS: no chest pain or palpitations GI ROS: negative for nausea, vomiting, abdominal pain, diarrhea, constipation Musculoskeletal ROS: negative for - joint swelling or muscle pain Neurological ROS: negative for - confusion, syncope Dermatological ROS: negative for pruritus and rash Psychiatric: negative for anxiety, depression, difficulty sleeping and memory loss  MEDICATIONS: Current Outpatient Medications  Medication Sig Dispense Refill  . amLODIPine (NORVASC) 5 MG tablet TAKE 1 TABLET BY  MOUTH EVERY DAY 90 tablet 1  . aspirin/sod bicarb/citric acid (ALKA-SELTZER ORAL) Take by mouth    . cetirizine (ZYRTEC) 10 MG tablet Take 10 mg by mouth once daily    . lisinopriL (ZESTRIL) 30 MG tablet Take 1 tablet (30 mg total) by mouth once daily 90 tablet 1  . lovastatin (MEVACOR) 40 MG tablet TAKE 1 TABLET BY MOUTH DAILY WITH DINNER 90 tablet 1  . meloxicam (MOBIC) 15 MG tablet TAKE 1 TABLET BY MOUTH EVERY DAY (Patient taking differently: Every other day) 90 tablet 3  . multivit-mins/iron/folic/lycop (COMPLETE MEN ORAL) Take by mouth    . omega 3-dha-epa-fish oil 1,000 mg (120 mg-180 mg) Cap Take by mouth 2 (two) times daily       . predniSONE (DELTASONE) 20 MG tablet 3qam for 5 days, 2qam for 5 days, 1qam for 5 days 30 tablet 0  . terbinafine HCL (LAMISIL) 250 mg tablet Take 1 tablet (250 mg total) by mouth once daily 30 tablet 1  . sildenafiL (VIAGRA) 100 MG tablet Take 1 tablet (100 mg total) by mouth once daily as needed for Erectile Dysfunction for up to 30 days 10 tablet 3   No current facility-administered medications for this visit.    ALLERGIES: Gabapentin  PAST MEDICAL HISTORY: No past medical history on file.  PAST SURGICAL HISTORY: No past surgical history on file.   FAMILY HISTORY: No family history on file.   SOCIAL HISTORY: Social History   Socioeconomic History  . Marital status: Widowed  Tobacco Use  . Smoking status: Never Smoker  . Smokeless tobacco: Current User  Types: Snuff    PHYSICAL EXAM: Vitals:   05/31/20 1349  BP: (!) 145/88  Pulse: 87   Body mass index is 33.08 kg/m. Weight: (!) 101.6 kg (224 lb)   GENERAL: Alert, active, oriented x3  HEENT: Pupils equal reactive to light. Extraocular movements are intact. Sclera clear. Palpebral conjunctiva normal red color.Pharynx clear.  NECK: Supple with no palpable mass and no adenopathy.  LUNGS: Sound clear with no rales rhonchi or wheezes.  HEART: Regular rhythm S1 and S2 without  murmur.  ABDOMEN: Soft and depressible, nontender with no palpable mass, no hepatomegaly. Wounds dry and clean.  EXTREMITIES: Well-developed well-nourished symmetrical with no dependent edema.  NEUROLOGICAL: Awake alert oriented, facial expression symmetrical, moving all extremities.  REVIEW OF DATA: I have reviewed the following data today: Appointment on 05/23/2020  Component Date Value  . Glucose 05/23/2020 106   . Sodium 05/23/2020 136   . Potassium 05/23/2020 4.3   . Chloride 05/23/2020 102   . Carbon Dioxide (CO2) 05/23/2020 27.7   . Urea Nitrogen (BUN) 05/23/2020 14   . Creatinine 05/23/2020 1.0   . Glomerular Filtration Ra* 05/23/2020 73   . Calcium 05/23/2020 9.5   . AST  05/23/2020 20   . ALT  05/23/2020 23   . Alk Phos (alkaline Phosp* 05/23/2020 55   . Albumin 05/23/2020 4.5   . Bilirubin, Total 05/23/2020 0.3   . Protein, Total 05/23/2020 7.0   . A/G Ratio 05/23/2020 1.8   . WBC (White Blood Cell Co* 05/23/2020 6.2   . RBC (Red Blood Cell Coun* 05/23/2020 4.39 (!)  . Hemoglobin 05/23/2020 14.4   . Hematocrit 05/23/2020 41.1   . MCV (Mean Corpuscular Vo* 05/23/2020 93.6   . MCH (Mean Corpuscular He* 05/23/2020 32.8 (!)  . MCHC (Mean Corpuscular H* 05/23/2020 35.0   . Platelet Count 05/23/2020 242   . RDW-CV (Red Cell Distrib* 05/23/2020 12.2   . MPV (Mean Platelet Volum* 05/23/2020 9.1 (!)  . Neutrophils 05/23/2020 2.92   . Lymphocytes 05/23/2020 2.39   . Monocytes 05/23/2020 0.60   . Eosinophils 05/23/2020 0.18   . Basophils 05/23/2020 0.05   . Neutrophil % 05/23/2020 47.4   . Lymphocyte % 05/23/2020 38.9   . Monocyte % 05/23/2020 9.8   . Eosinophil % 05/23/2020 2.9   . Basophil% 05/23/2020 0.8   . Immature Granulocyte % 05/23/2020 0.2   . Immature Granulocyte Cou* 05/23/2020 0.01      ASSESSMENT: Theodore Wiggins is a 75 y.o. male presenting for consultation for umbilical hernia.    The patient presents with a symptomatic, reducible inguinal hernia.  Patient was oriented about the diagnosis of inguinal hernia and its implication. The patient was oriented about the treatment alternatives (observation vs surgical repair). Due to patient symptoms, repair is recommended. Patient oriented about the surgical procedure, the use of mesh and its risk of complications such as: infection, bleeding, injury to vas deference, vasculature and testicle, injury to bowel or bladder, and chronic pain.   I will optimize his reflux medication to improve his cough.  This will help with postop pain management and reduce the risk of hernia recurrence.  Umbilical hernia, incarcerated [K42.0]  PLAN: 1. Robotic assisted laparoscopic umbilical hernia repair with mesh ([id number]) 2. I will discuss with Dr. Edwina Barth for medical alternatives to control reflux and cough before surgery since you will need to stop the Alka Seltzer and Aspirin for 5 days before surgery 3. CBC, CMP (done) 4. Contact us if you have  any concern.   Patient verbalized understanding, all questions were answered, and were agreeable with the plan outlined above.    Herbert Pun, MD  Electronically signed by Herbert Pun, MD

## 2020-06-13 ENCOUNTER — Other Ambulatory Visit
Admission: RE | Admit: 2020-06-13 | Discharge: 2020-06-13 | Disposition: A | Discharge status: Home / Self Care | Payer: Medicare PPO | Source: Ambulatory Visit | Attending: General Surgery | Admitting: General Surgery

## 2020-06-13 ENCOUNTER — Other Ambulatory Visit: Payer: Self-pay

## 2020-06-13 HISTORY — DX: Dyspnea, unspecified: R06.00

## 2020-06-13 HISTORY — DX: Depression, unspecified: F32.A

## 2020-06-13 HISTORY — DX: Other postherpetic nervous system involvement: B02.29

## 2020-06-13 HISTORY — DX: Gastro-esophageal reflux disease without esophagitis: K21.9

## 2020-06-13 NOTE — Patient Instructions (Signed)
INSTRUCTIONS FOR SURGERY     Your surgery is scheduled for:   Monday, MAY 16TH     To find out your arrival time for the day of surgery,          please call [phone] between 1 pm and 3 pm on :  Friday, MAY 13TH     When you arrive for surgery, report to the FIRST FLOOR OF THE MEDICAL MALL. ONCE YOU HAVE      FINISHED AT REGISTRATION, GO TO THE SECOND FLOOR AND SIGN IN AT THE SURGICAL       DESK.    REMEMBER: Instructions that are not followed completely may result in serious medical risk,  up to and including death, or upon the discretion of your surgeon and anesthesiologist,            your surgery may need to be rescheduled.  __X__ 1. Do not eat food after midnight the night before your procedure.                    No gum, candy, lozenger, tic tacs, tums or hard candies.                  ABSOLUTELY NOTHING SOLID IN YOUR MOUTH AFTER MIDNIGHT                    You may drink unlimited clear liquids up to 2 hours before you are scheduled to arrive for surgery.                   Do not drink anything within those 2 hours unless you need to take medicine, then take the                   smallest amount you need.  Clear liquids include:  water, apple juice without pulp,                   any flavor Gatorade, Black coffee, black tea.  Sugar may be added but no dairy/ honey /lemon.                        Broth and jello is not considered a clear liquid.  __x__  2. On the morning of surgery, please brush your teeth with toothpaste and water. You may rinse with                  mouthwash if you wish but DO NOT SWALLOW TOOTHPASTE OR MOUTHWASH  __X___3. NO alcohol for 24 hours before or after surgery.  __x___ 4.  Do NOT smoke or use e-cigarettes for 24 HOURS PRIOR TO SURGERY.                      DO NOT Use any chewable tobacco products for at least 6 hours prior to surgery.  __x___ 5. If you start any new medication after this  appointment and prior to surgery, please                   Bring it with you on the day  of surgery.  ___x__ 6. Notify your doctor if there is any change in your medical condition, such as fever,                   infection, vomitting, diarrhea or any open sores.  __x___ 7.  USE the CHG SOAP as instructed, the night before surgery and the day of surgery.                   Once you have washed with this soap, do NOT use any of the following: Powders, perfumes                    or lotions. Please do not wear make up, hairpins, clips or nail polish. You MAY wear deodorant.                   Men may shave their face and neck.                     DO NOT wear ANY jewelry on the day of surgery. If there are rings that are too tight to                    remove easily, please address this prior to the surgery day. Piercings need to be removed.                                                                     NO METAL ON YOUR BODY.                    Do NOT bring any valuables.  If you came to Pre-Admit testing then you will not need license,                     insurance card or credit card.  If you will be staying overnight, please either leave your things in                     the car or have your family be responsible for these items.                     [facility] IS NOT RESPONSIBLE FOR BELONGINGS OR VALUABLES.  ___X__ 8. DO NOT wear contact lenses on surgery day.  You may not have dentures,                     Hearing aides, contacts or glasses in the operating room. These items can be                    Placed in the Recovery Room to receive immediately after surgery.  __x___ 9. IF YOU ARE SCHEDULED TO GO HOME ON THE SAME DAY, YOU MUST                   Have someone to drive you home and to stay with you  for the first 24 hours.                    Have an arrangement prior to arriving on surgery day.  ___x__  10. Take the following medications on the morning of surgery with a sip of  water:                              1. FAMOTADINE                     2. OMEPRAZOLE                     3. TERBINAFINE                     4. DRY EYE DROPS, if needed                     5.   __X__  12. STOP ASPIRIN AND ALL ASPIRIN PRODUCTS AS OF TODAY, MAY 9TH                       THIS INCLUDES BC POWDERS / GOODIES POWDER  __x___ 13. STOP Anti-inflammatories as of TODAY, MAY 9TH                      This includes IBUPROFEN / MOTRIN / ADVIL / ALEVE/ NAPROXYN                    YOU MAY TAKE TYLENOL ANY TIME PRIOR TO SURGERY.  _____ 14.  Stop supplements until after surgery.                     This includes:  FISH OIL // MULTIVITAMINS // ALKA SELTZER //                  __X____17.  Continue to take the following medications but do not take on the morning of surgery:                         LISINOPRIL // METAMUCIL  ___x___18. Wear clean and comfortable clothing to the hospital.                     Sweat pants or loose fitting pants will be most comfortable.  CONTINUE TO TAKE YOUR EVENING MEDICINES AS USUAL.   CONTINUE TAKING YOUR LISINOPRIL EVERY DAY EXCEPT SURGERY DAY.

## 2020-06-15 ENCOUNTER — Other Ambulatory Visit: Payer: Self-pay

## 2020-06-15 ENCOUNTER — Other Ambulatory Visit
Admission: RE | Admit: 2020-06-15 | Discharge: 2020-06-15 | Disposition: A | Discharge status: Home / Self Care | Payer: Medicare PPO | Source: Ambulatory Visit | Attending: General Surgery | Admitting: General Surgery

## 2020-06-15 DIAGNOSIS — I10 Essential (primary) hypertension: Secondary | ICD-10-CM | POA: Diagnosis not present

## 2020-06-15 DIAGNOSIS — I455 Other specified heart block: Secondary | ICD-10-CM | POA: Insufficient documentation

## 2020-06-15 DIAGNOSIS — Z01810 Encounter for preprocedural cardiovascular examination: Secondary | ICD-10-CM | POA: Insufficient documentation

## 2020-06-16 ENCOUNTER — Other Ambulatory Visit: Payer: Medicare PPO

## 2020-06-20 ENCOUNTER — Ambulatory Visit: Payer: Medicare PPO

## 2020-06-20 ENCOUNTER — Other Ambulatory Visit: Payer: Self-pay

## 2020-06-20 ENCOUNTER — Encounter
Admission: RE | Disposition: A | Discharge status: Home / Self Care | Payer: Self-pay | Source: Home / Self Care | Attending: General Surgery

## 2020-06-20 ENCOUNTER — Encounter: Payer: Self-pay | Admitting: General Surgery

## 2020-06-20 ENCOUNTER — Ambulatory Visit
Admission: RE | Admit: 2020-06-20 | Discharge: 2020-06-20 | Disposition: A | Discharge status: Home / Self Care | Payer: Medicare PPO | Attending: General Surgery | Admitting: General Surgery

## 2020-06-20 DIAGNOSIS — Z7952 Long term (current) use of systemic steroids: Secondary | ICD-10-CM | POA: Diagnosis not present

## 2020-06-20 DIAGNOSIS — E785 Hyperlipidemia, unspecified: Secondary | ICD-10-CM | POA: Diagnosis not present

## 2020-06-20 DIAGNOSIS — K449 Diaphragmatic hernia without obstruction or gangrene: Secondary | ICD-10-CM | POA: Insufficient documentation

## 2020-06-20 DIAGNOSIS — Z888 Allergy status to other drugs, medicaments and biological substances status: Secondary | ICD-10-CM | POA: Diagnosis not present

## 2020-06-20 DIAGNOSIS — F17290 Nicotine dependence, other tobacco product, uncomplicated: Secondary | ICD-10-CM | POA: Diagnosis not present

## 2020-06-20 DIAGNOSIS — Z79899 Other long term (current) drug therapy: Secondary | ICD-10-CM | POA: Insufficient documentation

## 2020-06-20 DIAGNOSIS — Z791 Long term (current) use of non-steroidal anti-inflammatories (NSAID): Secondary | ICD-10-CM | POA: Insufficient documentation

## 2020-06-20 DIAGNOSIS — K420 Umbilical hernia with obstruction, without gangrene: Secondary | ICD-10-CM | POA: Insufficient documentation

## 2020-06-20 DIAGNOSIS — I10 Essential (primary) hypertension: Secondary | ICD-10-CM | POA: Insufficient documentation

## 2020-06-20 DIAGNOSIS — K4090 Unilateral inguinal hernia, without obstruction or gangrene, not specified as recurrent: Secondary | ICD-10-CM | POA: Diagnosis present

## 2020-06-20 SURGERY — REPAIR, HERNIA, UMBILICAL, ROBOT-ASSISTED
Anesthesia: General | Site: Abdomen

## 2020-06-20 MED ORDER — SUGAMMADEX SODIUM 200 MG/2ML IV SOLN
INTRAVENOUS | Status: DC | PRN
  Administered 2020-06-20: 200 mg via INTRAVENOUS

## 2020-06-20 MED ORDER — SUGAMMADEX SODIUM 500 MG/5ML IV SOLN
INTRAVENOUS | Status: AC
  Filled 2020-06-20: qty 5

## 2020-06-20 MED ORDER — ONDANSETRON HCL 4 MG/2ML IJ SOLN
INTRAMUSCULAR | Status: DC | PRN
  Administered 2020-06-20: 4 mg via INTRAVENOUS

## 2020-06-20 MED ORDER — CHLORHEXIDINE GLUCONATE 0.12 % MT SOLN: 15.0000 mL | Freq: Once | OROMUCOSAL | Status: AC

## 2020-06-20 MED ORDER — DEXAMETHASONE SODIUM PHOSPHATE 10 MG/ML IJ SOLN
INTRAMUSCULAR | Status: AC
  Filled 2020-06-20: qty 1

## 2020-06-20 MED ORDER — FENTANYL CITRATE (PF) 100 MCG/2ML IJ SOLN
INTRAMUSCULAR | Status: AC
  Administered 2020-06-20: 50 ug via INTRAVENOUS
  Filled 2020-06-20: qty 2

## 2020-06-20 MED ORDER — MIDAZOLAM HCL 2 MG/2ML IJ SOLN
INTRAMUSCULAR | Status: AC
  Filled 2020-06-20: qty 2

## 2020-06-20 MED ORDER — LACTATED RINGERS IV SOLN: INTRAVENOUS | Status: DC

## 2020-06-20 MED ORDER — CEFAZOLIN SODIUM-DEXTROSE 2-4 GM/100ML-% IV SOLN
2.0000 g | INTRAVENOUS | Status: AC
  Administered 2020-06-20: 2 g via INTRAVENOUS

## 2020-06-20 MED ORDER — OXYCODONE HCL 5 MG PO TABS: 5.0000 mg | ORAL_TABLET | Freq: Once | ORAL | Status: DC | PRN

## 2020-06-20 MED ORDER — FENTANYL CITRATE (PF) 100 MCG/2ML IJ SOLN
INTRAMUSCULAR | Status: AC
  Administered 2020-06-20: 25 ug via INTRAVENOUS
  Filled 2020-06-20: qty 2

## 2020-06-20 MED ORDER — ROCURONIUM BROMIDE 10 MG/ML (PF) SYRINGE
PREFILLED_SYRINGE | INTRAVENOUS | Status: AC
  Filled 2020-06-20: qty 10

## 2020-06-20 MED ORDER — EPHEDRINE SULFATE 50 MG/ML IJ SOLN
INTRAMUSCULAR | Status: DC | PRN
  Administered 2020-06-20: 10 mg via INTRAVENOUS
  Administered 2020-06-20 (×2): 5 mg via INTRAVENOUS

## 2020-06-20 MED ORDER — MEPERIDINE HCL 25 MG/ML IJ SOLN: 6.2500 mg | INTRAMUSCULAR | Status: DC | PRN

## 2020-06-20 MED ORDER — PROPOFOL 10 MG/ML IV BOLUS
INTRAVENOUS | Status: AC
  Filled 2020-06-20: qty 20

## 2020-06-20 MED ORDER — PROPOFOL 10 MG/ML IV BOLUS
INTRAVENOUS | Status: DC | PRN
  Administered 2020-06-20: 150 mg via INTRAVENOUS

## 2020-06-20 MED ORDER — ONDANSETRON HCL 4 MG/2ML IJ SOLN
INTRAMUSCULAR | Status: AC
  Filled 2020-06-20: qty 2

## 2020-06-20 MED ORDER — MIDAZOLAM HCL 2 MG/2ML IJ SOLN
INTRAMUSCULAR | Status: DC | PRN
  Administered 2020-06-20: 1 mg via INTRAVENOUS

## 2020-06-20 MED ORDER — FENTANYL CITRATE (PF) 100 MCG/2ML IJ SOLN
INTRAMUSCULAR | Status: DC | PRN
  Administered 2020-06-20: 50 ug via INTRAVENOUS

## 2020-06-20 MED ORDER — ROCURONIUM BROMIDE 100 MG/10ML IV SOLN
INTRAVENOUS | Status: DC | PRN
  Administered 2020-06-20: 10 mg via INTRAVENOUS
  Administered 2020-06-20: 50 mg via INTRAVENOUS
  Administered 2020-06-20 (×2): 10 mg via INTRAVENOUS

## 2020-06-20 MED ORDER — LIDOCAINE HCL (CARDIAC) PF 100 MG/5ML IV SOSY
PREFILLED_SYRINGE | INTRAVENOUS | Status: DC | PRN
  Administered 2020-06-20: 100 mg via INTRAVENOUS

## 2020-06-20 MED ORDER — FENTANYL CITRATE (PF) 100 MCG/2ML IJ SOLN
INTRAMUSCULAR | Status: AC
  Filled 2020-06-20: qty 2

## 2020-06-20 MED ORDER — LIDOCAINE HCL (PF) 2 % IJ SOLN
INTRAMUSCULAR | Status: AC
  Filled 2020-06-20: qty 5

## 2020-06-20 MED ORDER — OXYCODONE HCL 5 MG PO TABS
ORAL_TABLET | ORAL | Status: AC
  Filled 2020-06-20: qty 1

## 2020-06-20 MED ORDER — PROMETHAZINE HCL 25 MG/ML IJ SOLN: 6.2500 mg | INTRAMUSCULAR | Status: DC | PRN

## 2020-06-20 MED ORDER — DEXMEDETOMIDINE (PRECEDEX) IN NS 20 MCG/5ML (4 MCG/ML) IV SYRINGE
PREFILLED_SYRINGE | INTRAVENOUS | Status: AC
  Filled 2020-06-20: qty 5

## 2020-06-20 MED ORDER — OXYCODONE HCL 5 MG/5ML PO SOLN: 5.0000 mg | Freq: Once | ORAL | Status: DC | PRN

## 2020-06-20 MED ORDER — CHLORHEXIDINE GLUCONATE 0.12 % MT SOLN
OROMUCOSAL | Status: AC
  Administered 2020-06-20: 15 mL via OROMUCOSAL
  Filled 2020-06-20: qty 15

## 2020-06-20 MED ORDER — FENTANYL CITRATE (PF) 100 MCG/2ML IJ SOLN
25.0000 ug | INTRAMUSCULAR | Status: DC | PRN
  Administered 2020-06-20: 25 ug via INTRAVENOUS

## 2020-06-20 MED ORDER — HYDROCODONE-ACETAMINOPHEN 5-325 MG PO TABS
1.0000 | ORAL_TABLET | Freq: Once | ORAL | Status: AC
Start: 2020-06-20 — End: 2020-06-20
  Administered 2020-06-20: 1 via ORAL

## 2020-06-20 MED ORDER — BUPIVACAINE-EPINEPHRINE (PF) 0.25% -1:200000 IJ SOLN
INTRAMUSCULAR | Status: DC | PRN
  Administered 2020-06-20: 30 mL

## 2020-06-20 MED ORDER — CEFAZOLIN SODIUM-DEXTROSE 2-4 GM/100ML-% IV SOLN
INTRAVENOUS | Status: AC
  Filled 2020-06-20: qty 100

## 2020-06-20 MED ORDER — ORAL CARE MOUTH RINSE: 15.0000 mL | Freq: Once | OROMUCOSAL | Status: AC

## 2020-06-20 MED ORDER — HYDROCODONE-ACETAMINOPHEN 5-325 MG PO TABS
ORAL_TABLET | ORAL | Status: AC
  Filled 2020-06-20: qty 1

## 2020-06-20 MED ORDER — HYDROCODONE-ACETAMINOPHEN 5-325 MG PO TABS: 1.0000 | ORAL_TABLET | ORAL | 0 refills | Status: AC | PRN

## 2020-06-20 MED ORDER — EPHEDRINE 5 MG/ML INJ
INTRAVENOUS | Status: AC
  Filled 2020-06-20: qty 10

## 2020-06-20 MED ORDER — DEXAMETHASONE SODIUM PHOSPHATE 10 MG/ML IJ SOLN
INTRAMUSCULAR | Status: DC | PRN
  Administered 2020-06-20: 10 mg via INTRAVENOUS

## 2020-06-20 SURGICAL SUPPLY — 49 items
ADH SKN CLS APL DERMABOND .7 (GAUZE/BANDAGES/DRESSINGS) ×1
APL PRP STRL LF DISP 70% ISPRP (MISCELLANEOUS) ×1
BAG INFUSER PRESSURE 100CC (MISCELLANEOUS) IMPLANT
BLADE SURG SZ11 CARB STEEL (BLADE) ×2 IMPLANT
CANISTER SUCT 1200ML W/VALVE (MISCELLANEOUS) ×1 IMPLANT
CHLORAPREP W/TINT 26 (MISCELLANEOUS) ×2 IMPLANT
COVER TIP SHEARS 8 DVNC (MISCELLANEOUS) ×1 IMPLANT
COVER TIP SHEARS 8MM DA VINCI (MISCELLANEOUS) ×1
COVER WAND RF STERILE (DRAPES) ×2 IMPLANT
DEFOGGER SCOPE WARMER CLEARIFY (MISCELLANEOUS) ×2 IMPLANT
DERMABOND ADVANCED (GAUZE/BANDAGES/DRESSINGS) ×1
DERMABOND ADVANCED .7 DNX12 (GAUZE/BANDAGES/DRESSINGS) ×1 IMPLANT
DRAPE ARM DVNC X/XI (DISPOSABLE) ×3 IMPLANT
DRAPE COLUMN DVNC XI (DISPOSABLE) ×1 IMPLANT
DRAPE DA VINCI XI ARM (DISPOSABLE) ×3
DRAPE DA VINCI XI COLUMN (DISPOSABLE) ×1
ELECT REM PT RETURN 9FT ADLT (ELECTROSURGICAL) ×2
ELECTRODE REM PT RTRN 9FT ADLT (ELECTROSURGICAL) ×1 IMPLANT
GLOVE SURG ENC MOIS LTX SZ6.5 (GLOVE) ×5 IMPLANT
GLOVE SURG UNDER POLY LF SZ6.5 (GLOVE) ×5 IMPLANT
GOWN STRL REUS W/ TWL LRG LVL3 (GOWN DISPOSABLE) ×3 IMPLANT
GOWN STRL REUS W/TWL LRG LVL3 (GOWN DISPOSABLE) ×6
IRRIGATOR SUCT 8 DISP DVNC XI (IRRIGATION / IRRIGATOR) IMPLANT
IRRIGATOR SUCTION 8MM XI DISP (IRRIGATION / IRRIGATOR)
IV NS 1000ML (IV SOLUTION)
IV NS 1000ML BAXH (IV SOLUTION) IMPLANT
KIT PINK PAD W/HEAD ARE REST (MISCELLANEOUS) ×2
KIT PINK PAD W/HEAD ARM REST (MISCELLANEOUS) ×1 IMPLANT
LABEL OR SOLS (LABEL) ×2 IMPLANT
MANIFOLD NEPTUNE II (INSTRUMENTS) ×2 IMPLANT
MESH VENTRALIGHT ST 4.5IN (Mesh General) ×1 IMPLANT
NDL INSUFFLATION 14GA 120MM (NEEDLE) ×1 IMPLANT
NEEDLE HYPO 22GX1.5 SAFETY (NEEDLE) ×2 IMPLANT
NEEDLE INSUFFLATION 14GA 120MM (NEEDLE) ×2 IMPLANT
NS IRRIG 500ML POUR BTL (IV SOLUTION) ×2 IMPLANT
OBTURATOR OPTICAL STANDARD 8MM (TROCAR) ×1
OBTURATOR OPTICAL STND 8 DVNC (TROCAR) ×1
OBTURATOR OPTICALSTD 8 DVNC (TROCAR) ×1 IMPLANT
PACK LAP CHOLECYSTECTOMY (MISCELLANEOUS) ×2 IMPLANT
SEAL CANN UNIV 5-8 DVNC XI (MISCELLANEOUS) ×3 IMPLANT
SEAL XI 5MM-8MM UNIVERSAL (MISCELLANEOUS) ×3
SET TUBE SMOKE EVAC HIGH FLOW (TUBING) ×2 IMPLANT
SOLUTION ELECTROLUBE (MISCELLANEOUS) ×2 IMPLANT
SUT MNCRL 4-0 (SUTURE) ×2
SUT MNCRL 4-0 27XMFL (SUTURE) ×1
SUT STRATAFIX PDS 30 CT-1 (SUTURE) ×1 IMPLANT
SUT VICRYL 0 AB UR-6 (SUTURE) ×2 IMPLANT
SUT VLOC 90 S/L VL9 GS22 (SUTURE) ×2 IMPLANT
SUTURE MNCRL 4-0 27XMF (SUTURE) ×1 IMPLANT

## 2020-06-20 NOTE — Op Note (Signed)
Preoperative diagnosis: Umbilical hernia  Postoperative diagnosis: Umbilical hernia  Procedure: Robotic assisted laparoscopic umbilical hernia repair with mesh  Anesthesia: general  Surgeon: Carolan Shiver, MD, FACS  Wound Classification: Clean  Specimen: none  Complications: None  Estimated Blood Loss: 5 mL  Indications: A 75 year old male with symptomatic umbilical hernia. Repair indicated to improve pain and avoid complications such as incarceration or strangulation.   Findings: 1. 2 cm umbilical hernia hernia 2. Tension free repair achieved with 11.4 cm round bard mesh and suture 3. Adequate hemostasis  Description of procedure: The patient was brought to the operating room and general anesthesia was induced. A time-out was completed verifying correct patient, procedure, site, positioning, and implant(s) and/or special equipment prior to beginning this procedure. Antibiotics were administered prior to making the incision. SCDs placed. The anterior abdominal wall was prepped and draped in the standard sterile fashion.   Palmer's point chosen for entry.  Veress needle placed and abdomen insufflated to 15cm without any dramatic increase in pressure.  Needle removed and optiview technique used to place 8 mm port at same point.  No injury noted during placement. 2 additional ports were placed along left lateral aspect.  Xi robot then docked into place.  Hernia contents noted and reduced with combination of blunt, sharp dissection with scissors and fenestrated forceps.  Hemostasis achieved throughout this portion.  Once all hernia contents reduced, there was noted to be a 2 cm hernia.    Insufflation dropped to 8 mmHg and transfacial suture with 0 stratafix used to primarily close defect under minimal tension. Bard protected 11.4 cm mesh was placed within the abdominal cavity through the port and secured to the abdominal wall centered over the defect using the 0 stratafix previously  used to primarily close defect.  The mesh was then circumferentially sutured into the anterior abdominal wall using 2-0 VLock x2.  Any bleeding noted during this portion was no longer actively bleeding by end of securing mesh and tightening the suture.    Robot was undocked.  Abdomen then desufflated while camera within abdomen to ensure no signs of new bleed prior to removing camera and rest of ports completely.  All skin incisions closed with runninrg 4-0 Monocryl in a subcuticular fashion.  All wounds then dressed with Dermabond.  Patient was then successfully awakened and transferred to PACU in stable condition.  At the end of the procedure sponge and instrument counts were correct.

## 2020-06-20 NOTE — Interval H&P Note (Signed)
History and Physical Interval Note:  06/20/2020 10:15 AM  Theodore Wiggins  has presented today for surgery, with the diagnosis of K42.0 Umbilical hernia , incarcerated.  The various methods of treatment have been discussed with the patient and family. After consideration of risks, benefits and other options for treatment, the patient has consented to  Procedure(s): XI ROBOT ASSISTED UMBILICAL HERNIA REPAIR (N/A) as a surgical intervention.  The patient's history has been reviewed, patient examined, no change in status, stable for surgery.  I have reviewed the patient's chart and labs.  Questions were answered to the patient's satisfaction.     Carolan Shiver

## 2020-06-20 NOTE — Discharge Instructions (Signed)
  Diet: Resume home heart healthy regular diet.   Activity: No heavy lifting >20 pounds (children, pets, laundry, garbage) or strenuous activity until follow-up, but light activity and walking are encouraged. Do not drive or drink alcohol if taking narcotic pain medications.  Wound care: May shower with soapy water and pat dry (do not rub incisions), but no baths or submerging incision underwater until follow-up. (no swimming)   Medications: Resume all home medications. For mild to moderate pain: acetaminophen (Tylenol) or ibuprofen (if no kidney disease). Combining Tylenol with alcohol can substantially increase your risk of causing liver disease. Narcotic pain medications, if prescribed, can be used for severe pain, though may cause nausea, constipation, and drowsiness. Do not combine Tylenol and Norco within a 6 hour period as Norco contains Tylenol. If you do not need the narcotic pain medication, you do not need to fill the prescription.  Call office ([phone]) at any time if any questions, worsening pain, fevers/chills, bleeding, drainage from incision site, or other concerns.   AMBULATORY SURGERY  DISCHARGE INSTRUCTIONS   1) The drugs that you were given will stay in your system until tomorrow so for the next 24 hours you should not:  A) Drive an automobile B) Make any legal decisions C) Drink any alcoholic beverage   2) You may resume regular meals tomorrow.  Today it is better to start with liquids and gradually work up to solid foods.  You may eat anything you prefer, but it is better to start with liquids, then soup and crackers, and gradually work up to solid foods.   3) Please notify your doctor immediately if you have any unusual bleeding, trouble breathing, redness and pain at the surgery site, drainage, fever, or pain not relieved by medication.    4) Additional Instructions:        Please contact your physician with any problems or Same Day Surgery at  [phone], Monday through Friday 6 am to 4 pm, or [facility] [place] Main number at [phone]. 

## 2020-06-20 NOTE — Anesthesia Preprocedure Evaluation (Signed)
Anesthesia Evaluation  Patient identified by MRN, date of birth, ID band Patient awake    Reviewed: Allergy & Precautions, NPO status , Patient's Chart, lab work & pertinent test results  History of Anesthesia Complications Negative for: history of anesthetic complications  Airway Mallampati: III  TM Distance: >3 FB Neck ROM: Full    Dental  (+) Poor Dentition   Pulmonary neg pulmonary ROS, neg sleep apnea, neg COPD,    breath sounds clear to auscultation- rhonchi (-) wheezing      Cardiovascular hypertension, Pt. on medications (-) angina(-) CAD, (-) Past MI and (-) Cardiac Stents  Rhythm:Regular Rate:Normal - Systolic murmurs and - Diastolic murmurs    Neuro/Psych neg Seizures PSYCHIATRIC DISORDERS Depression negative neurological ROS     GI/Hepatic Neg liver ROS, GERD  ,  Endo/Other  negative endocrine ROSneg diabetes  Renal/GU negative Renal ROS     Musculoskeletal  (+) Arthritis ,   Abdominal (+) + obese,   Peds  Hematology negative hematology ROS (+)   Anesthesia Other Findings Past Medical History: No date: Arthritis No date: DDD (degenerative disc disease), lumbar No date: DDD (degenerative disc disease), lumbar No date: Depression No date: Dyspnea No date: GERD (gastroesophageal reflux disease) No date: Hypertension 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 No date: Vertigo   Reproductive/Obstetrics                             Anesthesia Physical Anesthesia Plan  ASA: II  Anesthesia Plan: General   Post-op Pain Management:    Induction: Intravenous  PONV Risk Score and Plan: 1 and Ondansetron and Dexamethasone  Airway Management Planned: Oral ETT  Additional Equipment:   Intra-op Plan:   Post-operative Plan: Extubation in OR  Informed Consent: I have reviewed the patients History and Physical, chart, labs and discussed the procedure including the risks, benefits and  alternatives for the proposed anesthesia with the patient or authorized representative who has indicated his/her understanding and acceptance.     Dental advisory given  Plan Discussed with: CRNA and Anesthesiologist  Anesthesia Plan Comments:         Anesthesia Quick Evaluation

## 2020-06-20 NOTE — Anesthesia Postprocedure Evaluation (Signed)
Anesthesia Post Note  Patient: Theodore Wiggins  Procedure(s) Performed: XI ROBOT ASSISTED UMBILICAL HERNIA REPAIR (N/A Abdomen)  Patient location during evaluation: PACU Anesthesia Type: General Level of consciousness: awake and alert and oriented Pain management: pain level controlled Vital Signs Assessment: post-procedure vital signs reviewed and stable Respiratory status: spontaneous breathing, nonlabored ventilation and respiratory function stable Cardiovascular status: blood pressure returned to baseline and stable Postop Assessment: no signs of nausea or vomiting Anesthetic complications: no   No complications documented.   Last Vitals:  Vitals:   06/20/20 1358 06/20/20 1449  BP: (!) 168/88 (!) 166/89  Pulse:  85  Resp: 16 16  Temp: (!) 36.3 C (!) 36.3 C  SpO2: 99% 99%    Last Pain:  Vitals:   06/20/20 1449  TempSrc: Temporal  PainSc: 4                  Casie Sturgeon

## 2020-06-20 NOTE — Transfer of Care (Signed)
Immediate Anesthesia Transfer of Care Note  Patient: Theodore Wiggins  Procedure(s) Performed: XI ROBOT ASSISTED UMBILICAL HERNIA REPAIR (N/A Abdomen)  Patient Location: PACU  Anesthesia Type:General  Level of Consciousness: sedated  Airway & Oxygen Therapy: Patient Spontanous Breathing and Patient connected to face mask oxygen  Post-op Assessment: Report given to RN and Post -op Vital signs reviewed and stable  Post vital signs: stable  Last Vitals:  Vitals Value Taken Time  BP 142/86 06/20/20 1250  Temp    Pulse 76 06/20/20 1252  Resp 25 06/20/20 1252  SpO2 100 % 06/20/20 1252  Vitals shown include unvalidated device data.  Last Pain:  Vitals:   06/20/20 1005  TempSrc:   PainSc: 3          Complications: No complications documented.

## 2020-06-20 NOTE — Anesthesia Procedure Notes (Signed)
Procedure Name: Intubation Date/Time: 06/20/2020 11:12 AM Performed by: Aline Brochure, CRNA Pre-anesthesia Checklist: Patient identified, Patient being monitored, Timeout performed, Emergency Drugs available and Suction available Patient Re-evaluated:Patient Re-evaluated prior to induction Oxygen Delivery Method: Circle system utilized Preoxygenation: Pre-oxygenation with 100% oxygen Induction Type: IV induction Ventilation: Mask ventilation without difficulty Laryngoscope Size: Mac and 4 Grade View: Grade I Tube type: Oral Tube size: 7.5 mm Number of attempts: 1 Airway Equipment and Method: Stylet Placement Confirmation: ETT inserted through vocal cords under direct vision,  positive ETCO2 and breath sounds checked- equal and bilateral Secured at: 22 cm Tube secured with: Tape Dental Injury: Teeth and Oropharynx as per pre-operative assessment

## 2020-09-14 ENCOUNTER — Encounter: Payer: Self-pay | Admitting: General Surgery

## 2020-10-21 ENCOUNTER — Ambulatory Visit
Admission: EM | Admit: 2020-10-21 | Discharge: 2020-10-21 | Disposition: A | Discharge status: Home / Self Care | Payer: Medicare PPO | Attending: Physician Assistant | Admitting: Physician Assistant

## 2020-10-21 ENCOUNTER — Encounter: Payer: Self-pay | Admitting: Emergency Medicine

## 2020-10-21 ENCOUNTER — Other Ambulatory Visit: Payer: Self-pay

## 2020-10-21 DIAGNOSIS — R059 Cough, unspecified: Secondary | ICD-10-CM

## 2020-10-21 DIAGNOSIS — J209 Acute bronchitis, unspecified: Secondary | ICD-10-CM

## 2020-10-21 DIAGNOSIS — J019 Acute sinusitis, unspecified: Secondary | ICD-10-CM

## 2020-10-21 MED ORDER — DOXYCYCLINE HYCLATE 100 MG PO CAPS: 100.0000 mg | ORAL_CAPSULE | Freq: Two times a day (BID) | ORAL | 0 refills | Status: AC

## 2020-10-21 NOTE — ED Triage Notes (Signed)
Patient c/o cough and chest congestion for a week.  Patient denies fevers.  °

## 2020-10-21 NOTE — Discharge Instructions (Addendum)
-  Since you have been sick for nearly 2 weeks, there is concerned that your viral illness has caused secondary sinus infection and bronchitis.  I have sent doxycycline which should treat both.  Continue with the over-the-counter cough medications and nasal sprays.  Increase rest and fluids. -If you develop fever, worsening cough or increased breathing difficulty you need to be seen again.  You may follow-up with your primary care provider or return here.  Go to the emergency department for any severe acute worsening of any of your symptoms.

## 2020-10-21 NOTE — ED Provider Notes (Signed)
[facility]    CSN: [id number] Arrival date & time: 10/21/20  1209      History   Chief Complaint Chief Complaint  Patient presents with   Cough    HPI Theodore Wiggins is a 75 y.o. male presenting for nearly 2-week history of nasal congestion, sinus pressure, headaches, productive cough and occasional shortness of breath.  Patient says that he started to get sick over Labor Day weekend after going to the beach with his family.  He says many of them were sick as well but he and his traveling partner were negative for COVID-19 with home tests.  He says his symptoms have just continued to worsen and he is not improving.  He denies any fevers.  Does not report any chest pain, vomiting or diarrhea.  Patient has been taking over-the-counter DayQuil and NyQuil for cough and says it does help his cough that he is fatigued and still feeling generally unwell.  He has no history of asthma or COPD.  No other complaints.  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   Procedure: TOTAL KNEE ARTHROPLASTY;  Surgeon: Juanell Fairly, MD;  Location: [facility] ORS;  Service: Orthopedics;  Laterality: Left;        Home Medications    Prior to Admission medications   Medication Sig Start Date End Date Taking? Authorizing Provider  amLODipine (NORVASC) 5 MG tablet Take 5 mg by mouth daily.   Yes [provider]  cetirizine (ZYRTEC) 10 MG tablet Take 10 mg by mouth at bedtime.    Yes [provider]  doxycycline (VIBRAMYCIN) 100 MG capsule Take 1 capsule (100 mg total) by mouth 2 (two) times daily for 7 days. 10/21/20 10/28/20 Yes Shirlee Latch, PA-C  famotidine (PEPCID) 20 MG tablet Take 20 mg by mouth 2 (two) times daily.   Yes [provider]  lisinopril (ZESTRIL) 30 MG tablet Take 30 mg by mouth daily. 04/30/16  Yes [provider]  lovastatin (MEVACOR) 40 MG tablet Take 40 mg by mouth at bedtime.  08/24/09  Yes [provider]  Multiple Vitamins-Minerals (MULTIVITAMIN PO) Take 1 tablet by mouth daily.   Yes [provider]  Omega-3 Fatty Acids (FISH OIL) 1000 MG CAPS Take 1,000 mg by mouth 2 (two) times daily.  12/27/10  Yes [provider]  omeprazole (PRILOSEC) 20 MG capsule Take 20 mg by mouth daily.   Yes [provider]  acetaminophen (TYLENOL) 325 MG tablet Take 650 mg by mouth every 6 (six) hours as needed.  [provider]  albuterol (VENTOLIN HFA) 108 (90 Base) MCG/ACT inhaler Inhale 2 puffs into the lungs every 6 (six) hours as needed for wheezing or shortness of breath. Patient not taking: No sig reported 04/29/20   Phineas Semen, MD  Calcium Carbonate Antacid (ALKA-SELTZER ANTACID PO) Take 1 tablet by mouth at bedtime.    [provider]  Glycerin-Hypromellose-PEG 400 ([facility] DRY EYE RELIEF OP) Place 2 drops into both eyes daily as needed (for dry eyes).    [provider]  terbinafine (LAMISIL) 250 MG tablet Take 250 mg by mouth daily. 02/24/20   [provider]    Family History Family History  Problem Relation Age of Onset   CVA Mother    Brain cancer Mother    Heart  attack Father 76   Diabetes Brother 4    Social History Social History   Tobacco Use   Smoking status: Never   Smokeless tobacco: Current    Types: Snuff  Vaping Use   Vaping Use: Never used  Substance Use Topics   Alcohol use: Never    Alcohol/week: 0.0 standard drinks   Drug use: Never     Allergies   Patient has no known allergies.   Review of Systems Review of Systems  Constitutional:  Positive for fatigue. Negative for fever.  HENT:  Positive for congestion, rhinorrhea, sinus pressure and sinus pain. Negative for sore throat.   Respiratory:  Positive for cough and shortness of breath. Negative for wheezing.   Cardiovascular:  Negative for chest pain.  Gastrointestinal:  Negative for abdominal pain, diarrhea, nausea and vomiting.  Musculoskeletal:  Negative for myalgias.  Neurological:  Positive for headaches. Negative for weakness and light-headedness.  Hematological:  Negative for adenopathy.    Physical Exam Triage Vital Signs ED Triage Vitals  Enc Vitals Group     BP 10/21/20 1235 (!) 155/96     Pulse Rate 10/21/20 1235 72     Resp 10/21/20 1235 16     Temp 10/21/20 1235 98.6 F (37 C)     Temp Source 10/21/20 1235 Oral     SpO2 10/21/20 1235 99 %     Weight 10/21/20 1232 220 lb (99.8 kg)     Height 10/21/20 1232 5\' 9"  (1.753 m)     Head Circumference --      Peak Flow --      Pain Score 10/21/20 1232 0     Pain Loc --      Pain Edu? --      Excl. in GC? --    No data found.  Updated Vital Signs BP (!) 155/96 (BP Location: Left Arm)   Pulse 72   Temp 98.6 F (37 C) (Oral)   Resp 16   Ht 5\' 9"  (1.753 m)   Wt 220 lb (99.8 kg)   SpO2 99%   BMI 32.49 kg/m       Physical Exam Vitals and nursing note reviewed.  Constitutional:      General: He is not in acute distress.    Appearance: Normal appearance. He is well-developed. He is not ill-appearing or diaphoretic.  HENT:     Head: Normocephalic and atraumatic.     Right Ear: Tympanic  membrane, ear canal and external ear normal.     Left Ear: Tympanic membrane, ear canal and external ear normal.     Nose: Congestion present.     Right Sinus: Maxillary sinus tenderness present.     Left Sinus:  Maxillary sinus tenderness present.     Mouth/Throat:     Mouth: Mucous membranes are moist.     Pharynx: Oropharynx is clear. Uvula midline.     Tonsils: No tonsillar abscesses.  Eyes:     General: No scleral icterus.       Right eye: No discharge.        Left eye: No discharge.     Conjunctiva/sclera: Conjunctivae normal.  Neck:     Thyroid: No thyromegaly.     Trachea: No tracheal deviation.  Cardiovascular:     Rate and Rhythm: Normal rate and regular rhythm.     Heart sounds: Normal heart sounds.  Pulmonary:     Effort: Pulmonary effort is normal. No respiratory distress.     Breath sounds: Rhonchi (few scattered rhonchi throughout) present. No wheezing or rales.  Musculoskeletal:     Cervical back: Normal range of motion and neck supple.  Lymphadenopathy:     Cervical: No cervical adenopathy.  Skin:    General: Skin is warm and dry.     Findings: No rash.  Neurological:     General: No focal deficit present.     Mental Status: He is alert. Mental status is at baseline.     Motor: No weakness.     Coordination: Coordination normal.     Gait: Gait normal.  Psychiatric:        Mood and Affect: Mood normal.        Behavior: Behavior normal.        Thought Content: Thought content normal.     UC Treatments / Results  Labs (all labs ordered are listed, but only abnormal results are displayed) Labs Reviewed - No data to display  EKG   Radiology No results found.  Procedures Procedures (including critical care time)  Medications Ordered in UC Medications - No data to display  Initial Impression / Assessment and Plan / UC Course  I have reviewed the triage vital signs and the nursing notes.  Pertinent labs & imaging results that were available  during my care of the patient were reviewed by me and considered in my medical decision making (see chart for details).  74 year old male presenting for approximately 2-week history of nasal congestion, sinus pressure, headaches, fatigue, productive cough and occasional shortness of breath.  Vitals are stable but blood pressure is a bit elevated at 155/96.  He is afebrile.  Oxygen saturation is 99% and he is in no acute respiratory distress.  Exam significant for nasal congestion and sinus tenderness.  Additionally he has few scattered rhonchi on auscultation of lungs.  Clinical presentation concerning for acute sinusitis and acute bronchitis.  I did offer chest x-ray but he decides to hold off on this for now.  Treating patient at this time with doxycycline.  Advised to continue with the decongestants.  I did offer to prescribe different cough medication but he says he is doing fine with the OTC meds.  Reviewed return and ED precautions with patient.   Final Clinical Impressions(s) / UC Diagnoses   Final diagnoses:  Acute sinusitis, recurrence not specified, unspecified location  Acute bronchitis, unspecified organism  Cough     Discharge Instructions      -Since you have been sick for nearly 2 weeks, there is concerned that your viral illness has caused secondary sinus infection and bronchitis.  I have sent doxycycline which should treat both.  Continue with the over-the-counter cough medications and nasal sprays.  Increase rest  and fluids. -If you develop fever, worsening cough or increased breathing difficulty you need to be seen again.  You may follow-up with your primary care provider or return here.  Go to the emergency department for any severe acute worsening of any of your symptoms.     ED Prescriptions     Medication Sig Dispense Auth. Provider   doxycycline (VIBRAMYCIN) 100 MG capsule Take 1 capsule (100 mg total) by mouth 2 (two) times daily for 7 days. 14 capsule Shirlee Latch, PA-C      PDMP not reviewed this encounter.   Shirlee Latch, PA-C 10/21/20 1311

## 2022-07-16 ENCOUNTER — Ambulatory Visit
Admission: EM | Admit: 2022-07-16 | Discharge: 2022-07-16 | Disposition: A | Discharge status: Home / Self Care | Payer: Medicare PPO

## 2022-07-16 ENCOUNTER — Ambulatory Visit (INDEPENDENT_AMBULATORY_CARE_PROVIDER_SITE_OTHER): Payer: Medicare PPO

## 2022-07-16 DIAGNOSIS — S20211A Contusion of right front wall of thorax, initial encounter: Secondary | ICD-10-CM

## 2022-07-16 MED ORDER — TRAMADOL HCL 50 MG PO TABS: 50.0000 mg | ORAL_TABLET | Freq: Four times a day (QID) | ORAL | 0 refills | Status: DC | PRN

## 2022-07-16 MED ORDER — BACLOFEN 5 MG PO TABS: 5.0000 mg | ORAL_TABLET | Freq: Three times a day (TID) | ORAL | 0 refills | Status: DC | PRN

## 2022-07-16 NOTE — ED Triage Notes (Addendum)
Pt presents to UC c/o RT ribcage pain after leaning over boat to wash hands x2 weeks ago. Pt states he has been taking ibuprofen for pain.

## 2022-07-16 NOTE — ED Provider Notes (Signed)
[facility]    CSN: [id number] Arrival date & time: 07/16/22  1537      History   Chief Complaint Chief Complaint  Patient presents with   Rib Injury    RT ribs    HPI Theodore Wiggins is a 77 y.o. male.   HPI  77 year old male with a past medical history significant for hypertension, GERD, postherpetic neuralgia, vertigo, and arthritis presents for evaluation of pain in his right rib cage.  He states that 2 weeks ago he leaned over the side of a boat to wash his hands and he felt pain in the side of his ribs and it has continued.  He states he did not feel a pop and he did not see any bruising.  He has shortness of breath and a cough at baseline but he has not noticed any increase.  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   Procedure: TOTAL KNEE ARTHROPLASTY;  Surgeon: Juanell Fairly, MD;  Location: [facility];  Service: Orthopedics;  Laterality: Left;       Home Medications    Prior to Admission medications   Medication Sig Start Date End Date Taking? Authorizing Provider  acetaminophen (TYLENOL) 325 MG tablet Take 650 mg by mouth every 6 (six) hours as needed.   Yes  [provider]  Baclofen 5 MG TABS Take 1 tablet (5 mg total) by mouth 3 (three) times daily as needed. 07/16/22  Yes Becky Augusta, NP  Bromfenac Sodium (PROLENSA) 0.07 % SOLN Prolensa 0.07 % eye drops  USE ONE DROP INTO THE RIGHT EYE ONCE DAILY FOR 2 WEEKS THEN STOP   Yes [provider]  Calcium Carbonate Antacid (ALKA-SELTZER ANTACID PO) Take 1 tablet by mouth at bedtime.   Yes [provider]  cetirizine (ZYRTEC) 10 MG tablet Take 10 mg by mouth at bedtime.    Yes [provider]  Glycerin-Hypromellose-PEG 400 ([facility] DRY EYE RELIEF OP) Place 2 drops into both eyes daily as needed (for dry eyes).   Yes [provider]  lisinopril (ZESTRIL) 30 MG tablet Take 30 mg by mouth daily. 04/30/16  Yes [provider]  lovastatin (MEVACOR) 40 MG tablet Take 40 mg by mouth at bedtime.  08/24/09  Yes [provider]  Multiple Vitamins-Minerals (MULTIVITAMIN PO) Take 1 tablet by mouth daily.   Yes [provider]  Omega-3 Fatty Acids (FISH OIL) 1000 MG CAPS Take 1,000 mg by mouth 2 (two) times daily.  12/27/10  Yes [provider]  omeprazole (PRILOSEC) 20 MG capsule Take 20  mg by mouth daily.   Yes [provider]  sildenafil (VIAGRA) 100 MG tablet Take by mouth. 12/05/21  Yes [provider]  traMADol (ULTRAM) 50 MG tablet Take 1 tablet (50 mg total) by mouth every 6 (six) hours as needed. 07/16/22  Yes Becky Augusta, NP  amLODipine (NORVASC) 5 MG tablet Take 5 mg by mouth daily.    [provider]  enoxaparin (LOVENOX) 30 MG/0.3ML injection INJECT 0.3 MLS (30 MG TOTAL) INTO THE SKIN EVERY 12 (TWELVE) HOURS.    [provider]  famotidine (PEPCID) 20 MG tablet Take 20 mg by mouth 2 (two) times daily.    [provider]  hydrochlorothiazide (HYDRODIURIL) 25 MG tablet Take 1 tablet by mouth daily. 11/20/18   [provider]  terbinafine (LAMISIL) 250 MG tablet Take 250 mg by mouth  daily. 02/24/20   [provider]    Family History Family History  Problem Relation Age of Onset   CVA Mother    Brain cancer Mother    Heart attack Father 17   Diabetes Brother 92    Social History Social History   Tobacco Use   Smoking status: Never   Smokeless tobacco: Current    Types: Snuff  Vaping Use   Vaping Use: Never used  Substance Use Topics   Alcohol use: Never    Alcohol/week: 0.0 standard drinks of alcohol   Drug use: Never     Allergies   Gabapentin   Review of Systems Review of Systems  Constitutional:  Negative for fever.  Respiratory:  Positive for cough and shortness of breath. Negative for wheezing.   Cardiovascular:  Positive for chest pain.       Right rib pain.     Physical Exam Triage Vital Signs ED Triage Vitals  Enc Vitals Group     BP      Pulse      Resp      Temp      Temp src      SpO2      Weight      Height      Head Circumference      Peak Flow      Pain Score      Pain Loc      Pain Edu?      Excl. in GC?    No data found.  Updated Vital Signs BP (!) 167/97 (BP Location: Right Arm)   Pulse 62   Temp 97.9 F (36.6 C) (Oral)   SpO2 97%   Visual Acuity Right Eye Distance:   Left Eye Distance:   Bilateral Distance:    Right Eye Near:   Left Eye Near:    Bilateral Near:     Physical Exam Vitals and nursing note reviewed.  Constitutional:      Appearance: Normal appearance. He is not ill-appearing.  HENT:     Head: Normocephalic and atraumatic.  Cardiovascular:     Rate and Rhythm: Normal rate.     Pulses: Normal pulses.     Heart sounds: Normal heart sounds. No murmur heard.    No friction rub. No gallop.  Pulmonary:     Effort: Pulmonary effort is normal.     Breath sounds: Normal breath sounds. No wheezing, rhonchi or rales.     Comments: Patient has tenderness with palpation of his rib cage in the right axillary and right posterior thoracic region.  No crepitus appreciated. Chest:  Chest wall: Tenderness present.  Skin:    General: Skin is warm and dry.     Capillary Refill: Capillary refill takes less than 2 seconds.  Neurological:     General: No focal deficit present.     Mental Status: He is alert and oriented to person, place, and time.      UC Treatments / Results  Labs (all labs ordered are listed, but only abnormal results are displayed) Labs Reviewed - No data to display  EKG   Radiology DG Ribs Unilateral W/Chest Right  Result Date: 07/16/2022 CLINICAL DATA:  Right rib injury EXAM: RIGHT RIBS AND CHEST - 5 VIEW COMPARISON:  04/29/2020 chest radiograph FINDINGS: No displaced fracture or other bone lesions are seen involving the ribs. No evidence of pneumothorax or pleural effusion. Both lungs are clear. Aortic tortuosity. Heart size and mediastinal contours are within normal limits. IMPRESSION: No displaced rib fracture.  No acute cardiopulmonary process. Electronically Signed   By: Wiliam Ke M.D.   On: 07/16/2022 18:22    Procedures Procedures (including critical care time)  Medications Ordered in UC Medications - No data to display  Initial Impression / Assessment and Plan / UC Course  I have reviewed the triage vital signs and the nursing notes.  Pertinent labs & imaging results that were available during my care of the patient were reviewed by me and considered in my medical decision making (see chart for details).   Patient is a pleasant, nontoxic-appearing 77 year old male presenting for evaluation of 2 weeks of pain in the right ribs as outlined in HPI above.  The patient reports that he has increased pain when he bends over to tie his shoes and also when he lays down or takes a deep breath.  He is able to speak in full sentence without dyspnea or tachypnea.  His lung sounds are clear to auscultation all fields.  He does have tenderness with palpation of his lateral chest wall on the right but no appreciable crepitus.  I will obtain a  right rib series to evaluate for the presence of possible rib fracture.  Right rib series independently reviewed and evaluated by me.  Impression: There appears to be a nondisplaced fracture of the right seventh rib.  Lung fields are well aerated and no evidence of pneumothorax noted.  Radiology overread is pending. Radiology impression states there are no displaced rib fractures or bone lesions.  No acute cardiopulmonary process.  I will discharge patient home with a diagnosis of contusion of chest wall.  I will have him continue to use over-the-counter Tylenol and/or ibuprofen according to the package instructions as needed for pain.  He may also apply topical lidocaine patches.  I will give the patient a short prescription of tramadol that he can use at bedtime as needed for pain relief.  He does not have any open narcotic scripts per PDMP.  Additionally, I will prescribe 5 mg baclofen tablets that he can take every 8 hours to help with muscle pain.  I will also encourage patient to continue to take deep breaths to prevent the formation of pneumonia.  He should do 10 breaths every hour.   Final Clinical Impressions(s) / UC Diagnoses   Final diagnoses:  Contusion of right chest wall, initial encounter     Discharge Instructions      Your chest x-ray will and rib films did not show any rib fractures per the radiology report.  Given your mechanism you have most likely bruised  your chest wall or strained the muscles between your ribs.  You may continue to use over-the-counter Tylenol and/or ibuprofen according to package instructions as needed for mild to moderate pain.  You may also apply topical lidocaine patches to your anterior chest wall to help with pain.  I will prescribe you some tramadol that she can use at bedtime to help you get some sleep and as needed for severe pain.  Be mindful of this medication will make you drowsy so do not drink alcohol or drive if you take it.  I am also  going to prescribe you a nonsedating muscle laxer called baclofen that she can take every 8 hours.  This may help you with your muscle pain.  Please make sure that you are taking at least 10 deep breaths an hour to help fully inflate your lungs and prevent the development of pneumonia.  If you develop any new or worsening symptoms please return for reevaluation or see your primary care provider.     ED Prescriptions     Medication Sig Dispense Auth. Provider   Baclofen 5 MG TABS Take 1 tablet (5 mg total) by mouth 3 (three) times daily as needed. 30 tablet Becky Augusta, NP   traMADol (ULTRAM) 50 MG tablet Take 1 tablet (50 mg total) by mouth every 6 (six) hours as needed. 15 tablet Becky Augusta, NP      I have reviewed the PDMP during this encounter.   Becky Augusta, NP 07/16/22 1845

## 2022-07-16 NOTE — Discharge Instructions (Addendum)
Your chest x-ray will and rib films did not show any rib fractures per the radiology report.  Given your mechanism you have most likely bruised your chest wall or strained the muscles between your ribs.  You may continue to use over-the-counter Tylenol and/or ibuprofen according to package instructions as needed for mild to moderate pain.  You may also apply topical lidocaine patches to your anterior chest wall to help with pain.  I will prescribe you some tramadol that she can use at bedtime to help you get some sleep and as needed for severe pain.  Be mindful of this medication will make you drowsy so do not drink alcohol or drive if you take it.  I am also going to prescribe you a nonsedating muscle laxer called baclofen that she can take every 8 hours.  This may help you with your muscle pain.  Please make sure that you are taking at least 10 deep breaths an hour to help fully inflate your lungs and prevent the development of pneumonia.  If you develop any new or worsening symptoms please return for reevaluation or see your primary care provider.

## 2023-06-04 ENCOUNTER — Observation Stay
Admit: 2023-06-04 | Discharge: 2023-06-04 | Disposition: A | Discharge status: Home / Self Care | Attending: Family Medicine | Admitting: Family Medicine

## 2023-06-04 ENCOUNTER — Inpatient Hospital Stay
Admission: EM | Admit: 2023-06-04 | Discharge: 2023-06-06 | DRG: 281 | Disposition: A | Discharge status: Other Acute Inpatient Hospital | Attending: Internal Medicine | Admitting: Internal Medicine

## 2023-06-04 ENCOUNTER — Other Ambulatory Visit: Payer: Self-pay

## 2023-06-04 ENCOUNTER — Emergency Department

## 2023-06-04 DIAGNOSIS — Z79899 Other long term (current) drug therapy: Secondary | ICD-10-CM

## 2023-06-04 DIAGNOSIS — Z720 Tobacco use: Secondary | ICD-10-CM

## 2023-06-04 DIAGNOSIS — I10 Essential (primary) hypertension: Secondary | ICD-10-CM | POA: Diagnosis not present

## 2023-06-04 DIAGNOSIS — K219 Gastro-esophageal reflux disease without esophagitis: Secondary | ICD-10-CM | POA: Diagnosis present

## 2023-06-04 DIAGNOSIS — Z8249 Family history of ischemic heart disease and other diseases of the circulatory system: Secondary | ICD-10-CM

## 2023-06-04 DIAGNOSIS — I214 Non-ST elevation (NSTEMI) myocardial infarction: Principal | ICD-10-CM

## 2023-06-04 DIAGNOSIS — E785 Hyperlipidemia, unspecified: Secondary | ICD-10-CM | POA: Diagnosis not present

## 2023-06-04 DIAGNOSIS — I2510 Atherosclerotic heart disease of native coronary artery without angina pectoris: Secondary | ICD-10-CM | POA: Diagnosis present

## 2023-06-04 DIAGNOSIS — Z1152 Encounter for screening for COVID-19: Secondary | ICD-10-CM

## 2023-06-04 DIAGNOSIS — I452 Bifascicular block: Secondary | ICD-10-CM | POA: Diagnosis present

## 2023-06-04 DIAGNOSIS — Z96652 Presence of left artificial knee joint: Secondary | ICD-10-CM | POA: Diagnosis present

## 2023-06-04 HISTORY — DX: Hyperlipidemia, unspecified: E78.5

## 2023-06-04 HISTORY — DX: Non-ST elevation (NSTEMI) myocardial infarction: I21.4

## 2023-06-04 HISTORY — DX: Atherosclerotic heart disease of native coronary artery without angina pectoris: I25.10

## 2023-06-04 LAB — BASIC METABOLIC PANEL WITH GFR
Anion gap: 11 (ref 5–15)
BUN: 17 mg/dL (ref 8–23)
CO2: 20 mmol/L — ABNORMAL LOW (ref 22–32)
Calcium: 8.7 mg/dL — ABNORMAL LOW (ref 8.9–10.3)
Chloride: 102 mmol/L (ref 98–111)
Creatinine, Ser: 1.02 mg/dL (ref 0.61–1.24)
GFR, Estimated: >60 mL/min (ref >60)
Glucose, Bld: 116 mg/dL — ABNORMAL HIGH (ref 70–99)
Potassium: 3.6 mmol/L (ref 3.5–5.1)
Sodium: 133 mmol/L — ABNORMAL LOW (ref 135–145)

## 2023-06-04 LAB — LIPID PANEL
Cholesterol: 114 mg/dL (ref 0–200)
HDL: 34 mg/dL — ABNORMAL LOW (ref >40)
LDL Cholesterol: 63 mg/dL (ref 0–99)
Total CHOL/HDL Ratio: 3.4 ratio
Triglycerides: 84 mg/dL (ref <150)
VLDL: 17 mg/dL (ref 0–40)

## 2023-06-04 LAB — CBC
HCT: 37.9 % — ABNORMAL LOW (ref 39.0–52.0)
Hemoglobin: 13.3 g/dL (ref 13.0–17.0)
MCH: 32.3 pg (ref 26.0–34.0)
MCHC: 35.1 g/dL (ref 30.0–36.0)
MCV: 92 fL (ref 80.0–100.0)
Platelets: 214 10*3/uL (ref 150–400)
RBC: 4.12 MIL/uL — ABNORMAL LOW (ref 4.22–5.81)
RDW: 12.8 % (ref 11.5–15.5)
WBC: 10.1 10*3/uL (ref 4.0–10.5)
nRBC: 0 % (ref 0.0–0.2)

## 2023-06-04 LAB — RESP PANEL BY RT-PCR (RSV, FLU A&B, COVID)  RVPGX2
Influenza A by PCR: NEGATIVE
Influenza B by PCR: NEGATIVE
Resp Syncytial Virus by PCR: NEGATIVE
SARS Coronavirus 2 by RT PCR: NEGATIVE

## 2023-06-04 LAB — HEPARIN LEVEL (UNFRACTIONATED): Heparin Unfractionated: <0.1 [IU]/mL — ABNORMAL LOW (ref 0.30–0.70)

## 2023-06-04 LAB — HEMOGLOBIN A1C
Hgb A1c MFr Bld: 5.5 % (ref 4.8–5.6)
Mean Plasma Glucose: 111.15 mg/dL

## 2023-06-04 LAB — TROPONIN I (HIGH SENSITIVITY)
Troponin I (High Sensitivity): 3552 ng/L (ref <18)
Troponin I (High Sensitivity): 3925 ng/L (ref <18)
Troponin I (High Sensitivity): 6263 ng/L (ref <18)
Troponin I (High Sensitivity): 6484 ng/L (ref <18)
Troponin I (High Sensitivity): 7570 ng/L (ref <18)

## 2023-06-04 LAB — PROTIME-INR
INR: 1.1 (ref 0.8–1.2)
Prothrombin Time: 14.2 s (ref 11.4–15.2)

## 2023-06-04 LAB — APTT: aPTT: 28 s (ref 24–36)

## 2023-06-04 MED ORDER — ASPIRIN 81 MG PO TBEC
81.0000 mg | DELAYED_RELEASE_TABLET | Freq: Every day | ORAL | Status: DC
  Filled 2023-06-04: qty 1

## 2023-06-04 MED ORDER — HEPARIN BOLUS VIA INFUSION
2800.0000 [IU] | Freq: Once | INTRAVENOUS | Status: AC
  Administered 2023-06-04: 2800 [IU] via INTRAVENOUS
  Filled 2023-06-04: qty 2800

## 2023-06-04 MED ORDER — ONDANSETRON HCL 4 MG/2ML IJ SOLN: 4.0000 mg | Freq: Four times a day (QID) | INTRAMUSCULAR | Status: DC | PRN

## 2023-06-04 MED ORDER — SODIUM CHLORIDE 0.9% FLUSH
3.0000 mL | Freq: Two times a day (BID) | INTRAVENOUS | Status: DC
  Administered 2023-06-04 – 2023-06-05 (×3): 3 mL via INTRAVENOUS

## 2023-06-04 MED ORDER — ATORVASTATIN CALCIUM 80 MG PO TABS
80.0000 mg | ORAL_TABLET | Freq: Every day | ORAL | Status: DC
  Administered 2023-06-04 – 2023-06-06 (×3): 80 mg via ORAL
  Filled 2023-06-04: qty 1
  Filled 2023-06-04: qty 4
  Filled 2023-06-04: qty 1

## 2023-06-04 MED ORDER — HEPARIN (PORCINE) 25000 UT/250ML-% IV SOLN
1900.0000 [IU]/h | INTRAVENOUS | Status: DC
  Administered 2023-06-04: 1100 [IU]/h via INTRAVENOUS
  Administered 2023-06-05: 1700 [IU]/h via INTRAVENOUS
  Administered 2023-06-05: 1500 [IU]/h via INTRAVENOUS
  Administered 2023-06-06: 1900 [IU]/h via INTRAVENOUS
  Filled 2023-06-04 (×4): qty 250

## 2023-06-04 MED ORDER — METOPROLOL SUCCINATE ER 25 MG PO TB24
12.5000 mg | ORAL_TABLET | Freq: Every day | ORAL | Status: DC
  Administered 2023-06-04 – 2023-06-06 (×3): 12.5 mg via ORAL
  Filled 2023-06-04 (×3): qty 1

## 2023-06-04 MED ORDER — SODIUM CHLORIDE 0.9 % WEIGHT BASED INFUSION: 3.0000 mL/kg/h | INTRAVENOUS | Status: AC

## 2023-06-04 MED ORDER — SODIUM CHLORIDE 0.9 % WEIGHT BASED INFUSION
1.0000 mL/kg/h | INTRAVENOUS | Status: DC
  Administered 2023-06-04 – 2023-06-05 (×2): 1 mL/kg/h via INTRAVENOUS

## 2023-06-04 MED ORDER — SODIUM CHLORIDE 0.9 % IV SOLN: 250.0000 mL | INTRAVENOUS | Status: DC | PRN

## 2023-06-04 MED ORDER — NICOTINE 21 MG/24HR TD PT24
21.0000 mg | MEDICATED_PATCH | Freq: Once | TRANSDERMAL | Status: AC
  Administered 2023-06-04: 21 mg via TRANSDERMAL
  Filled 2023-06-04: qty 1

## 2023-06-04 MED ORDER — ASPIRIN 81 MG PO CHEW
81.0000 mg | CHEWABLE_TABLET | ORAL | Status: AC
  Administered 2023-06-05: 81 mg via ORAL
  Filled 2023-06-04: qty 1

## 2023-06-04 MED ORDER — HEPARIN BOLUS VIA INFUSION
4000.0000 [IU] | Freq: Once | INTRAVENOUS | Status: AC
  Administered 2023-06-04: 4000 [IU] via INTRAVENOUS
  Filled 2023-06-04: qty 4000

## 2023-06-04 MED ORDER — SODIUM CHLORIDE 0.9% FLUSH: 3.0000 mL | INTRAVENOUS | Status: DC | PRN

## 2023-06-04 MED ORDER — LORATADINE 10 MG PO TABS
10.0000 mg | ORAL_TABLET | Freq: Every day | ORAL | Status: DC
  Administered 2023-06-04 – 2023-06-06 (×3): 10 mg via ORAL
  Filled 2023-06-04 (×3): qty 1

## 2023-06-04 MED ORDER — NITROGLYCERIN 0.4 MG SL SUBL: 0.4000 mg | SUBLINGUAL_TABLET | SUBLINGUAL | Status: DC | PRN

## 2023-06-04 MED ORDER — ASPIRIN 325 MG PO TABS
325.0000 mg | ORAL_TABLET | Freq: Once | ORAL | Status: AC
  Administered 2023-06-04: 325 mg via ORAL
  Filled 2023-06-04: qty 1

## 2023-06-04 MED ORDER — ACETAMINOPHEN 325 MG PO TABS: 650.0000 mg | ORAL_TABLET | ORAL | Status: DC | PRN

## 2023-06-04 NOTE — Assessment & Plan Note (Signed)
 BP stable Titrate home regimen

## 2023-06-04 NOTE — H&P (Signed)
 History and Physical    Patient: Theodore Wiggins:[id number] DOB: 1945-02-27 DOA: 06/04/2023 DOS: the patient was seen and examined on 06/04/2023 PCP: Little Riff, MD  Patient coming from: Home  Chief Complaint:  Chief Complaint  Patient presents with   Chest Pain   Shortness of Breath   HPI: Theodore Wiggins is a 78 y.o. male with medical history significant of hypertension, hyperlipidemia presenting with NSTEMI. Pt reports roughly 1 week of chest pain. Has had intermittent substernal chest pressure that has been ongoing.  Chest pain mild to moderate in intensity.  Intermittent shortness of breath.  Chest pain at rest as well with exertion.  No abdominal pain.  Mild nausea.  Symptoms progressively worsened over the course of the past 4 to 5 days.  Had severe shortness of breath and orthopnea over the course of the night.  Denies any known prior history of coronary disease in the past.  Does report positive family history of father having an acute fatal MI in his late 63s.  No reported alcohol  or tobacco use.  Does report remote history of chest pain roughly 40 years ago.  Had imaging showing recollateralization of coronary arteries in the 1980s.  Not on aspirin.  Taking lisinopril  as well as a low-dose statin. Presented to the ER afebrile, hemodynamically stable.  Satting well on room air.  White count 10.1, ED 13.3, platelets 214 troponin 3500s-->3900s.  COVID flu and RSV negative.  Creatinine 1.02.  Chest x-ray within normal limits.  EKG normal sinus rhythm and bundle branch block. Review of Systems: As mentioned in the history of present illness. All other systems reviewed and are negative. Past Medical History:  Diagnosis Date   Arthritis    Coronary artery disease    DDD (degenerative disc disease), lumbar    DDD (degenerative disc disease), lumbar    Depression    Dyspnea    GERD (gastroesophageal reflux disease)    Hyperlipidemia    Hypertension    NSTEMI (non-ST elevated  myocardial infarction) (HCC) 06/04/2023   Post herpetic neuralgia    Vertigo    Past Surgical History:  Procedure Laterality Date   CARDIOVASCULAR STRESS TEST     CATARACT EXTRACTION, BILATERAL     ELBOW SURGERY Left    untwisted nerves. 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 His smokeless tobacco use includes snuff. He reports that he does not drink alcohol  and does not use drugs.  Allergies  Allergen Reactions   Gabapentin  Other (See Comments)    Family History  Problem Relation Age of Onset   CVA Mother    Brain cancer Mother    Heart attack Father 27   Diabetes Brother 27    Prior to Admission medications   Medication Sig Start Date End Date Taking? Authorizing Provider  acetaminophen  (TYLENOL ) 325 MG tablet Take 650 mg by mouth every 6 (six) hours as needed.   Yes [provider]  Calcium Carbonate Antacid (ALKA-SELTZER ANTACID PO) Take 1 tablet by mouth at bedtime.   Yes [provider]  cetirizine (  ZYRTEC) 10 MG tablet Take 10 mg by mouth at bedtime.    Yes [provider]  Glycerin -Hypromellose-PEG 400 ([facility] DRY EYE RELIEF OP) Place 2 drops into both eyes daily as needed (for dry eyes).   Yes [provider]  lisinopril  (ZESTRIL ) 30 MG tablet Take 30 mg by mouth daily. 04/30/16  Yes [provider]  lovastatin (MEVACOR) 40 MG tablet Take 40 mg by mouth at bedtime.  08/24/09  Yes [provider]  sildenafil (VIAGRA) 100 MG tablet Take 100 mg by mouth as needed for erectile dysfunction. 12/05/21  Yes [provider]  Baclofen  5  MG TABS Take 1 tablet (5 mg total) by mouth 3 (three) times daily as needed. Patient not taking: Reported on 06/04/2023 07/16/22   Kent Pear, NP  Multiple Vitamins-Minerals (MULTIVITAMIN PO) Take 1 tablet by mouth daily. Patient not taking: Reported on 06/04/2023    [provider]  Omega-3 Fatty Acids (FISH OIL) 1000 MG CAPS Take 1,000 mg by mouth 2 (two) times daily.  Patient not taking: Reported on 06/04/2023 12/27/10   [provider]  omeprazole (PRILOSEC) 20 MG capsule Take 20 mg by mouth daily. Patient not taking: Reported on 06/04/2023    [provider]  traMADol  (ULTRAM ) 50 MG tablet Take 1 tablet (50 mg total) by mouth every 6 (six) hours as needed. Patient not taking: Reported on 06/04/2023 07/16/22   Kent Pear, NP    Physical Exam: Vitals:   06/04/23 0939 06/04/23 0940 06/04/23 1000 06/04/23 1137  BP:  (!) 130/93 126/84 104/81  Pulse:  71 71 73  Resp:  20 17 19   Temp:  98.2 F (36.8 C)    TempSrc:  Oral    SpO2:  98% 98%   Weight: 93.4 kg     Height: 5\' 10"  (1.778 m)      Physical Exam Constitutional:      Appearance: He is normal weight.  HENT:     Head: Normocephalic and atraumatic.     Mouth/Throat:     Mouth: Mucous membranes are moist.  Eyes:     Pupils: Pupils are equal, round, and reactive to light.  Cardiovascular:     Rate and Rhythm: Normal rate and regular rhythm.  Abdominal:     General: Bowel sounds are normal.  Musculoskeletal:        General: Normal range of motion.     Cervical back: Normal range of motion.  Neurological:     General: No focal deficit present.  Psychiatric:        Mood and Affect: Mood normal.     Data Reviewed:  There are no new results to review at this time.  DG Chest 2 View CLINICAL DATA:  Intermittent shortness of breath with chest pain for 5 days.  EXAM: CHEST - 2 VIEW  COMPARISON:  Radiographs 07/16/2022 and 04/29/2020.  FINDINGS: The heart size and mediastinal contours are  stable. The lungs are mildly hyperinflated but clear. No pleural effusion or pneumothorax. No acute osseous findings are demonstrated. There are mild degenerative changes in the spine. Telemetry leads overlie the chest.  IMPRESSION: Stable chest.  No evidence of acute cardiopulmonary process.  Electronically Signed   By: Elmon Hagedorn M.D.   On: 06/04/2023 10:50  Lab Results  Component Value Date   WBC 10.1 06/04/2023   HGB 13.3 06/04/2023   HCT 37.9 (L) 06/04/2023   MCV 92.0 06/04/2023   PLT 214 06/04/2023   Last metabolic panel  Lab Results  Component Value Date   GLUCOSE 116 (H) 06/04/2023   NA 133 (L) 06/04/2023   K 3.6 06/04/2023   CL 102 06/04/2023   CO2 20 (L) 06/04/2023   BUN 17 06/04/2023   CREATININE 1.02 06/04/2023   GFRNONAA >60 06/04/2023   CALCIUM 8.7 (L) 06/04/2023   ANIONGAP 11 06/04/2023    Assessment and Plan: * NSTEMI (non-ST elevated myocardial infarction) (HCC) Recurrent central chest pain x 4 to 5 days-improving Troponin in the 3000 send presentation concerning for NSTEMI EKG normal sinus rhythm with bundle branch block Started on heparin drip in the ER 2D echo Risk stratification labs Case discussed with Dr. Parks Bollman Follow-up cardiology recommendations  Hyperlipidemia Continue statin  Hypertension BP stable Titrate home regimen      Advance Care Planning:   Code Status: Full Code   Consults: Cardiology   Family Communication: Girlfriend at the bedside   Severity of Illness: The appropriate patient status for this patient is OBSERVATION. Observation status is judged to be reasonable and necessary in order to provide the required intensity of service to ensure the patient's safety. The patient's presenting symptoms, physical exam findings, and initial radiographic and laboratory data in the context of their medical condition is felt to place them at decreased risk for further clinical deterioration. Furthermore, it is anticipated  that the patient will be medically stable for discharge from the hospital within 2 midnights of admission.   Author: Corrinne Din, MD 06/04/2023 12:15 PM  For on call review [email or web address].

## 2023-06-04 NOTE — ED Triage Notes (Signed)
 Pt to ED for central non radiating chest pain (pressure) since 5 days ago, intermittent.  States BP has been high. States last night could not sleep flat, had to sleep in recliner because couldn't breathe. States is still SOB. Respirations are unlabored. Skin dry. No thinners. Takes lisinopril  and a statin.
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
## 2023-06-04 NOTE — Assessment & Plan Note (Signed)
 Continue statin.

## 2023-06-04 NOTE — ED Provider Notes (Signed)
 [facility] Provider Note    Event Date/Time   First MD Initiated Contact with Patient 06/04/23 [phone]     (approximate)  History   Chief Complaint: Chest Pain and Shortness of Breath  HPI  Theodore Wiggins is a 78 y.o. male with a past medical history of arthritis, CAD, gastric reflux, hypertension, hyperlipidemia, presents to the emergency department for intermittent chest pain/pressure.  According to the patient over the last 5 days he has been experiencing chest pressure that has been occurring intermittently.  He is also concerned that his blood pressure has been running high over the last few days as well and was having trouble breathing especially when lying flat.  Patient went to [facility] clinic and was referred to the emergency department for further evaluation.  Overall the patient appears well, no distress, currently lying in the bed speaking in full sentences.  Physical Exam   Triage Vital Signs: ED Triage Vitals  Encounter Vitals Group     BP 06/04/23 0940 (!) 130/93     Systolic BP Percentile --      Diastolic BP Percentile --      Pulse Rate 06/04/23 0940 71     Resp 06/04/23 0940 20     Temp 06/04/23 0940 98.2 F (36.8 C)     Temp Source 06/04/23 0940 Oral     SpO2 06/04/23 0940 98 %     Weight 06/04/23 0939 206 lb (93.4 kg)     Height 06/04/23 0939 5\' 10"  (1.778 m)     Head Circumference --      Peak Flow --      Pain Score 06/04/23 0937 5     Pain Loc --      Pain Education --      Exclude from Growth Chart --     Most recent vital signs: Vitals:   06/04/23 0940  BP: (!) 130/93  Pulse: 71  Resp: 20  Temp: 98.2 F (36.8 C)  SpO2: 98%    General: Awake, no distress.  CV:  Good peripheral perfusion.  Regular rate and rhythm  Resp:  Normal effort.  Equal breath sounds bilaterally.  Abd:  No distention.  Soft, nontender.  No rebound or guarding.   ED Results / Procedures / Treatments   EKG  EKG viewed and interpreted by  myself shows a normal sinus rhythm at 74 bpm with a slightly widened QRS, left axis deviation, largely normal intervals with nonspecific but no concerning ST changes.  EKG largely unchanged from prior 06/15/2020.  RADIOLOGY  I have reviewed interpreted chest x-ray images.  No consolidation or pulmonary edema seen in my evaluation. Radiology has read the x-ray is negative.   MEDICATIONS ORDERED IN ED: Medications - No data to display   IMPRESSION / MDM / ASSESSMENT AND PLAN / ED COURSE  I reviewed the triage vital signs and the nursing notes.  Patient's presentation is most consistent with acute presentation with potential threat to life or bodily function.  Patient presents emergency department for chest pain intermittent over the last 5 days long some shortness of breath is worse when lying flat.  We will check labs including a CBC chemistry and heart enzyme.  Will obtain a chest x-ray.  EKG does not appear to show any significant finding.  Patient's labs have resulted showing a negative respiratory panel.  CBC and chemistry show no significant finding.  However the patient's troponin is significantly elevated at 3552.  Chest  x-ray is clear and EKG shows no concerning findings compared to his last EKG.  However given the patient's significant troponin elevation I highly suspect that the patient suffered an MI likely 5 days ago when his symptoms first began.  Patient denies any chest pain currently.  Will dose heparin and placed on a heparin infusion and admit to the hospitalist for further workup and treatment.  CRITICAL CARE Performed by: Ruth Cove   Total critical care time: 30 minutes  Critical care time was exclusive of separately billable procedures and treating other patients.  Critical care was necessary to treat or prevent imminent or life-threatening deterioration.  Critical care was time spent personally by me on the following activities: development of treatment plan  with patient and/or surrogate as well as nursing, discussions with consultants, evaluation of patient's response to treatment, examination of patient, obtaining history from patient or surrogate, ordering and performing treatments and interventions, ordering and review of laboratory studies, ordering and review of radiographic studies, pulse oximetry and re-evaluation of patient's condition.   FINAL CLINICAL IMPRESSION(S) / ED DIAGNOSES   Chest pain NSTEMI  Note:  This document was prepared using Dragon voice recognition software and may include unintentional dictation errors.   Ruth Cove, MD 06/04/23 [phone]

## 2023-06-04 NOTE — Consult Note (Signed)
 PHARMACY - ANTICOAGULATION CONSULT NOTE  Pharmacy Consult for heparin infusion Indication: chest pain/ACS  Allergies  Allergen Reactions   Gabapentin  Other (See Comments)    Patient Measurements: Height: 5\' 10"  (177.8 cm) Weight: 93.4 kg (206 lb) IBW/kg (Calculated) : 73 HEPARIN DW (KG): 91.9  Vital Signs: Temp: 97.5 F (36.4 C) (04/29 1412) Temp Source: Axillary (04/29 1412) BP: 116/76 (04/29 1300) Pulse Rate: 67 (04/29 1300)  Labs: Recent Labs    06/04/23 0950 06/04/23 1129  HGB 13.3  --   HCT 37.9*  --   PLT 214  --   APTT  --  28  LABPROT  --  14.2  INR  --  1.1  CREATININE 1.02  --   TROPONINIHS 3,552* 3,925*    Estimated Creatinine Clearance: 68.6 mL/min (by C-G formula based on SCr of 1.02 mg/dL).   Medical History: Past Medical History:  Diagnosis Date   Arthritis    Coronary artery disease    DDD (degenerative disc disease), lumbar    DDD (degenerative disc disease), lumbar    Depression    Dyspnea    GERD (gastroesophageal reflux disease)    Hyperlipidemia    Hypertension    NSTEMI (non-ST elevated myocardial infarction) (HCC) 06/04/2023   Post herpetic neuralgia    Vertigo     Medications:  No AC prior to admission  Assessment: 78yo patient with PMH relevant for arthritis, CAD, gastric reflux, hypertension, and hyperlipidemia. Patient admitted with ED with chest pain. Pharmacy consulted to initiate and manage heparin infusion for ACS.  Baseline CBC, aptt and INR appropriate to start heparin infusion.  Goal of Therapy:  Heparin level 0.3-0.7 units/ml Monitor platelets by anticoagulation protocol: Yes   Plan: heparin level subtherapeutic ---will give 2800 units IV heparin bolus x 1 ---will increase heparin infusion rate to 1500 units/hr ---check heparin level in 8 hours after rate change ---Continue to monitor H&H and platelets  Barney Boozer, PharmD, BCPS 06/04/2023 3:05 PM

## 2023-06-04 NOTE — Consult Note (Signed)
 PHARMACY - ANTICOAGULATION CONSULT NOTE  Pharmacy Consult for heparin infusion Indication: chest pain/ACS  Allergies  Allergen Reactions   Gabapentin  Other (See Comments)    Patient Measurements: Height: 5\' 10"  (177.8 cm) Weight: 93.4 kg (206 lb) IBW/kg (Calculated) : 73 HEPARIN DW (KG): 91.9  Vital Signs: Temp: 98.2 F (36.8 C) (04/29 0940) Temp Source: Oral (04/29 0940) BP: 126/84 (04/29 1000) Pulse Rate: 71 (04/29 1000)  Labs: Recent Labs    06/04/23 0950  HGB 13.3  HCT 37.9*  PLT 214  CREATININE 1.02  TROPONINIHS 3,552*    Estimated Creatinine Clearance: 68.6 mL/min (by C-G formula based on SCr of 1.02 mg/dL).   Medical History: Past Medical History:  Diagnosis Date   Arthritis    Coronary artery disease    DDD (degenerative disc disease), lumbar    DDD (degenerative disc disease), lumbar    Depression    Dyspnea    GERD (gastroesophageal reflux disease)    Hyperlipidemia    Hypertension    NSTEMI (non-ST elevated myocardial infarction) (HCC) 06/04/2023   Post herpetic neuralgia    Vertigo     Medications:  No AC prior to admission  Assessment: 78yo patient with PMH relevant for arthritis, CAD, gastric reflux, hypertension, and hyperlipidemia. Patient admitted with ED with chest pain. Pharmacy consulted to initiate and manage heparin infusion for ACS.  Baseline CBC, aptt and INR appropriate to start heparin infusion.  Goal of Therapy:  Heparin level 0.3-0.7 units/ml Monitor platelets by anticoagulation protocol: Yes   Plan:  Give 4000 units bolus x 1 Start heparin infusion at 1100 units/hr Check HL in 8 hours and daily while on heparin Continue to monitor H&H and platelets  Delores Edelstein Rodriguez-Guzman PharmD, BCPS 06/04/2023 11:50 AM

## 2023-06-04 NOTE — Assessment & Plan Note (Signed)
 Recurrent central chest pain x 4 to 5 days-improving Troponin in the 3000 send presentation concerning for NSTEMI EKG normal sinus rhythm with bundle branch block Started on heparin drip in the ER 2D echo Risk stratification labs Case discussed with Dr. Parks Bollman Follow-up cardiology recommendations

## 2023-06-04 NOTE — ED Notes (Signed)
 Patient transported to X-ray

## 2023-06-05 ENCOUNTER — Encounter
Admission: EM | Disposition: A | Discharge status: Other Acute Inpatient Hospital | Payer: Self-pay | Source: Home / Self Care | Attending: Internal Medicine

## 2023-06-05 DIAGNOSIS — Z79899 Other long term (current) drug therapy: Secondary | ICD-10-CM | POA: Diagnosis not present

## 2023-06-05 DIAGNOSIS — Z1152 Encounter for screening for COVID-19: Secondary | ICD-10-CM | POA: Diagnosis not present

## 2023-06-05 DIAGNOSIS — I2510 Atherosclerotic heart disease of native coronary artery without angina pectoris: Secondary | ICD-10-CM | POA: Diagnosis present

## 2023-06-05 DIAGNOSIS — Z96652 Presence of left artificial knee joint: Secondary | ICD-10-CM | POA: Diagnosis present

## 2023-06-05 DIAGNOSIS — K219 Gastro-esophageal reflux disease without esophagitis: Secondary | ICD-10-CM | POA: Diagnosis present

## 2023-06-05 DIAGNOSIS — I214 Non-ST elevation (NSTEMI) myocardial infarction: Secondary | ICD-10-CM | POA: Diagnosis present

## 2023-06-05 DIAGNOSIS — E785 Hyperlipidemia, unspecified: Secondary | ICD-10-CM | POA: Diagnosis present

## 2023-06-05 DIAGNOSIS — I10 Essential (primary) hypertension: Secondary | ICD-10-CM | POA: Diagnosis present

## 2023-06-05 DIAGNOSIS — Z8249 Family history of ischemic heart disease and other diseases of the circulatory system: Secondary | ICD-10-CM | POA: Diagnosis not present

## 2023-06-05 DIAGNOSIS — I452 Bifascicular block: Secondary | ICD-10-CM | POA: Diagnosis present

## 2023-06-05 DIAGNOSIS — Z720 Tobacco use: Secondary | ICD-10-CM | POA: Diagnosis not present

## 2023-06-05 HISTORY — PX: LEFT HEART CATH AND CORONARY ANGIOGRAPHY: CATH118249

## 2023-06-05 LAB — BASIC METABOLIC PANEL WITH GFR
Anion gap: 8 (ref 5–15)
BUN: 14 mg/dL (ref 8–23)
CO2: 21 mmol/L — ABNORMAL LOW (ref 22–32)
Calcium: 8.6 mg/dL — ABNORMAL LOW (ref 8.9–10.3)
Chloride: 105 mmol/L (ref 98–111)
Creatinine, Ser: 0.78 mg/dL (ref 0.61–1.24)
GFR, Estimated: >60 mL/min (ref >60)
Glucose, Bld: 105 mg/dL — ABNORMAL HIGH (ref 70–99)
Potassium: 3.6 mmol/L (ref 3.5–5.1)
Sodium: 134 mmol/L — ABNORMAL LOW (ref 135–145)

## 2023-06-05 LAB — ECHOCARDIOGRAM COMPLETE
Area-P 1/2: 3.62 cm2
Height: 70 in
S' Lateral: 3.2 cm
Weight: 3296 [oz_av]

## 2023-06-05 LAB — CBC
HCT: 34.8 % — ABNORMAL LOW (ref 39.0–52.0)
Hemoglobin: 12.3 g/dL — ABNORMAL LOW (ref 13.0–17.0)
MCH: 32 pg (ref 26.0–34.0)
MCHC: 35.3 g/dL (ref 30.0–36.0)
MCV: 90.6 fL (ref 80.0–100.0)
Platelets: 191 10*3/uL (ref 150–400)
RBC: 3.84 MIL/uL — ABNORMAL LOW (ref 4.22–5.81)
RDW: 12.9 % (ref 11.5–15.5)
WBC: 8.2 10*3/uL (ref 4.0–10.5)
nRBC: 0 % (ref 0.0–0.2)

## 2023-06-05 LAB — TROPONIN I (HIGH SENSITIVITY): Troponin I (High Sensitivity): 6889 ng/L (ref <18)

## 2023-06-05 LAB — HEPARIN LEVEL (UNFRACTIONATED)
Heparin Unfractionated: 0.24 [IU]/mL — ABNORMAL LOW (ref 0.30–0.70)
Heparin Unfractionated: <0.1 [IU]/mL — ABNORMAL LOW (ref 0.30–0.70)

## 2023-06-05 SURGERY — LEFT HEART CATH AND CORONARY ANGIOGRAPHY
Anesthesia: Moderate Sedation

## 2023-06-05 MED ORDER — HEPARIN (PORCINE) IN NACL 1000-0.9 UT/500ML-% IV SOLN
INTRAVENOUS | Status: AC
  Filled 2023-06-05: qty 1000

## 2023-06-05 MED ORDER — ASPIRIN 81 MG PO TBEC
81.0000 mg | DELAYED_RELEASE_TABLET | Freq: Every day | ORAL | Status: DC
  Administered 2023-06-06: 81 mg via ORAL
  Filled 2023-06-05: qty 1

## 2023-06-05 MED ORDER — VERAPAMIL HCL 2.5 MG/ML IV SOLN
INTRAVENOUS | Status: AC
  Filled 2023-06-05: qty 2

## 2023-06-05 MED ORDER — FENTANYL CITRATE (PF) 100 MCG/2ML IJ SOLN
INTRAMUSCULAR | Status: AC
  Filled 2023-06-05: qty 2

## 2023-06-05 MED ORDER — HEPARIN (PORCINE) IN NACL 1000-0.9 UT/500ML-% IV SOLN
INTRAVENOUS | Status: DC | PRN
  Administered 2023-06-05: 1000 mL

## 2023-06-05 MED ORDER — LIDOCAINE HCL (PF) 1 % IJ SOLN
INTRAMUSCULAR | Status: DC | PRN
  Administered 2023-06-05: 2 mL

## 2023-06-05 MED ORDER — HYDRALAZINE HCL 20 MG/ML IJ SOLN: 10.0000 mg | INTRAMUSCULAR | Status: AC | PRN

## 2023-06-05 MED ORDER — MIDAZOLAM HCL 2 MG/2ML IJ SOLN
INTRAMUSCULAR | Status: AC
  Filled 2023-06-05: qty 2

## 2023-06-05 MED ORDER — SODIUM CHLORIDE 0.9 % IV SOLN: 250.0000 mL | INTRAVENOUS | Status: DC | PRN

## 2023-06-05 MED ORDER — SODIUM CHLORIDE 0.9% FLUSH: 3.0000 mL | INTRAVENOUS | Status: DC | PRN

## 2023-06-05 MED ORDER — LABETALOL HCL 5 MG/ML IV SOLN: 10.0000 mg | INTRAVENOUS | Status: AC | PRN

## 2023-06-05 MED ORDER — HEPARIN SODIUM (PORCINE) 1000 UNIT/ML IJ SOLN
INTRAMUSCULAR | Status: DC | PRN
  Administered 2023-06-05: 5000 [IU] via INTRAVENOUS

## 2023-06-05 MED ORDER — HEPARIN SODIUM (PORCINE) 1000 UNIT/ML IJ SOLN
INTRAMUSCULAR | Status: AC
  Filled 2023-06-05: qty 10

## 2023-06-05 MED ORDER — LIDOCAINE HCL 1 % IJ SOLN
INTRAMUSCULAR | Status: AC
  Filled 2023-06-05: qty 20

## 2023-06-05 MED ORDER — SODIUM CHLORIDE 0.9% FLUSH: 3.0000 mL | Freq: Two times a day (BID) | INTRAVENOUS | Status: DC

## 2023-06-05 MED ORDER — FENTANYL CITRATE (PF) 100 MCG/2ML IJ SOLN
INTRAMUSCULAR | Status: DC | PRN
  Administered 2023-06-05: 50 ug via INTRAVENOUS

## 2023-06-05 MED ORDER — SODIUM CHLORIDE 0.9 % WEIGHT BASED INFUSION
1.0000 mL/kg/h | INTRAVENOUS | Status: AC
  Administered 2023-06-05: 1 mL/kg/h via INTRAVENOUS

## 2023-06-05 MED ORDER — IOHEXOL 300 MG/ML  SOLN
INTRAMUSCULAR | Status: DC | PRN
  Administered 2023-06-05: 40 mL

## 2023-06-05 MED ORDER — HEPARIN BOLUS VIA INFUSION
1400.0000 [IU] | Freq: Once | INTRAVENOUS | Status: AC
  Administered 2023-06-05: 1400 [IU] via INTRAVENOUS
  Filled 2023-06-05: qty 1400

## 2023-06-05 MED ORDER — MIDAZOLAM HCL 2 MG/2ML IJ SOLN
INTRAMUSCULAR | Status: DC | PRN
  Administered 2023-06-05: 1 mg via INTRAVENOUS

## 2023-06-05 MED ORDER — FUROSEMIDE 10 MG/ML IJ SOLN
40.0000 mg | Freq: Once | INTRAMUSCULAR | Status: AC
  Administered 2023-06-05: 40 mg via INTRAVENOUS
  Filled 2023-06-05: qty 4

## 2023-06-05 MED ORDER — VERAPAMIL HCL 2.5 MG/ML IV SOLN
INTRAVENOUS | Status: DC | PRN
  Administered 2023-06-05: 2.5 mg via INTRAVENOUS

## 2023-06-05 SURGICAL SUPPLY — 9 items
CATH INFINITI 5 FR JL3.5 (CATHETERS) IMPLANT
CATH INFINITI JR4 5F (CATHETERS) IMPLANT
DEVICE RAD TR BAND REGULAR (VASCULAR PRODUCTS) IMPLANT
DRAPE BRACHIAL (DRAPES) IMPLANT
GLIDESHEATH SLEND A-KIT 6F 22G (SHEATH) IMPLANT
GUIDEWIRE INQWIRE 1.5J.035X260 (WIRE) IMPLANT
PACK CARDIAC CATH (CUSTOM PROCEDURE TRAY) ×1 IMPLANT
SET ATX-X65L (MISCELLANEOUS) IMPLANT
STATION PROTECTION PRESSURIZED (MISCELLANEOUS) IMPLANT

## 2023-06-05 NOTE — Discharge Summary (Signed)
 Physician Discharge Summary   Patient: Theodore Wiggins MRN: [id number] DOB: December 23, 1945  Admit date:     06/04/2023  Discharge date: 06/05/23  Discharge Physician: Jodeane Mulligan   PCP: Little Riff, MD   Recommendations at discharge:     Pt to be transferred to tertiary care facility-[facility]  Discharge Diagnoses: Principal Problem:   NSTEMI (non-ST elevated myocardial infarction) [facility]) Active Problems:   Hypertension   Hyperlipidemia  Resolved Problems:   * No resolved hospital problems. *   Hospital Course:   78 y.o. male with medical history significant of hypertension, hyperlipidemia presenting with NSTEMI.   Assessment and Plan:  NSTEMI - Recurrent chest pain on presentation.  Initial troponin is greater than 3000, rising to 7770.  Cardiology on board following closely.  Left heart catheter noting heavily calcified proximal LAD lesion.  Subsequent need to transition her to tertiary care facility for complex PCI.  Patient be transferred to [facility], accepting physician is Dr. Sheela Denmark.  Currently awaiting bed placement.  hypertension/hyperlipidemia - Cardiac regimen likely to change.     Consultants: Cardiology Procedures performed: Left heart cath Disposition:  Transfer to tertiary care facility-[facility] Diet recommendation:  Cardiac diet  DISCHARGE MEDICATION:   Follow-up Information     Alluri, Odessa Bene, MD. Go in 1 week(s).   Specialty: Cardiology Contact information: [address] [place] [address] [phone]                 Discharge Exam: Theodore Wiggins Weights   06/04/23 4696 06/04/23 2038 06/05/23 1104  Weight: 93.4 kg 89.4 kg 89.4 kg    GENERAL:  Alert, pleasant, no acute distress  HEENT:  EOMI CARDIOVASCULAR:  RRR, no murmurs appreciated RESPIRATORY:  Clear to auscultation, no wheezing, rales, or rhonchi GASTROINTESTINAL:  Soft, nontender, nondistended EXTREMITIES:  No LE edema bilaterally NEURO:  No new  focal deficits appreciated SKIN:  No rashes noted PSYCH:  Appropriate mood and affect    Condition at discharge: stable  The results of significant diagnostics from this hospitalization (including imaging, microbiology, ancillary and laboratory) are listed below for reference.   Imaging Studies: ECHOCARDIOGRAM COMPLETE Result Date: 06/05/2023    ECHOCARDIOGRAM REPORT   Patient Name:   Theodore Wiggins Date of Exam: 06/04/2023 Medical Rec #:  [id number]       Height:       70.0 in Accession #:    [id number]      Weight:       206.0 lb Date of Birth:  March 30, 1945       BSA:          2.114 m Patient Age:    78 years        BP:           130/93 mmHg Patient Gender: M               HR:           64 bpm. Exam Location:  [facility] Procedure: 2D Echo, Cardiac Doppler and Color Doppler (Both Spectral and Color            Flow Doppler were utilized during procedure). Indications:     R07.9 Chest pain  History:         Patient has no prior history of Echocardiogram examinations.                  CAD, Signs/Symptoms:Dyspnea; Risk Factors:Hypertension and  Dyslipidemia. NSTEMI.  Sonographer:     Brigid Canada RDCS Referring Phys:  [id number] Alayne Allis NEWTON Diagnosing Phys: Percival Brace MD IMPRESSIONS  1. Left ventricular ejection fraction, by estimation, is 45 to 50%. The left ventricle has mildly decreased function. The left ventricle demonstrates regional wall motion abnormalities (see scoring diagram/findings for description). Left ventricular diastolic parameters are consistent with Grade I diastolic dysfunction (impaired relaxation).  2. Right ventricular systolic function is normal. The right ventricular size is normal.  3. The mitral valve is normal in structure. Mild mitral valve regurgitation. No evidence of mitral stenosis.  4. The aortic valve is normal in structure. Aortic valve regurgitation is mild. No aortic stenosis is present.  5. The inferior vena cava is normal in size with greater  than 50% respiratory variability, suggesting right atrial pressure of 3 mmHg. FINDINGS  Left Ventricle: Left ventricular ejection fraction, by estimation, is 45 to 50%. The left ventricle has mildly decreased function. The left ventricle demonstrates regional wall motion abnormalities. Strain was performed and the global longitudinal strain is indeterminate. The left ventricular internal cavity size was normal in size. There is no left ventricular hypertrophy. Left ventricular diastolic parameters are consistent with Grade I diastolic dysfunction (impaired relaxation).  LV Wall Scoring: The apical lateral segment, apical septal segment, and apex are hypokinetic. Right Ventricle: The right ventricular size is normal. No increase in right ventricular wall thickness. Right ventricular systolic function is normal. Left Atrium: Left atrial size was normal in size. Right Atrium: Right atrial size was normal in size. Pericardium: There is no evidence of pericardial effusion. Mitral Valve: The mitral valve is normal in structure. Mild mitral valve regurgitation. No evidence of mitral valve stenosis. Tricuspid Valve: The tricuspid valve is normal in structure. Tricuspid valve regurgitation is mild . No evidence of tricuspid stenosis. Aortic Valve: The aortic valve is normal in structure. Aortic valve regurgitation is mild. No aortic stenosis is present. Pulmonic Valve: The pulmonic valve was normal in structure. Pulmonic valve regurgitation is not visualized. No evidence of pulmonic stenosis. Aorta: The aortic root is normal in size and structure. Venous: The inferior vena cava is normal in size with greater than 50% respiratory variability, suggesting right atrial pressure of 3 mmHg. IAS/Shunts: No atrial level shunt detected by color flow Doppler. Additional Comments: 3D was performed not requiring image post processing on an independent workstation and was indeterminate.  LEFT VENTRICLE PLAX 2D LVIDd:         4.70 cm    Diastology LVIDs:         3.20 cm   LV e' medial:    5.17 cm/s LV PW:         1.10 cm   LV E/e' medial:  14.2 LV IVS:        1.10 cm   LV e' lateral:   8.05 cm/s LVOT diam:     2.10 cm   LV E/e' lateral: 9.1 LV SV:         69 LV SV Index:   33 LVOT Area:     3.46 cm  RIGHT VENTRICLE             IVC RV Basal diam:  3.90 cm     IVC diam: 2.00 cm RV S prime:     10.22 cm/s TAPSE (M-mode): 3.4 cm LEFT ATRIUM             Index        RIGHT ATRIUM  Index LA diam:        5.10 cm 2.41 cm/m   RA Area:     12.50 cm LA Vol (A2C):   44.3 ml 20.96 ml/m  RA Volume:   30.50 ml  14.43 ml/m LA Vol (A4C):   59.0 ml 27.91 ml/m LA Biplane Vol: 52.5 ml 24.84 ml/m  AORTIC VALVE LVOT Vmax:   96.90 cm/s LVOT Vmean:  62.850 cm/s LVOT VTI:    0.200 m  AORTA Ao Root diam: 3.70 cm Ao Asc diam:  4.00 cm MITRAL VALVE               TRICUSPID VALVE MV Area (PHT): 3.62 cm    TR Peak grad:   13.4 mmHg MV Decel Time: 210 msec    TR Vmax:        183.00 cm/s MV E velocity: 73.35 cm/s MV A velocity: 90.65 cm/s  SHUNTS MV E/A ratio:  0.81        Systemic VTI:  0.20 m                            Systemic Diam: 2.10 cm Percival Brace MD Electronically signed by Percival Brace MD Signature Date/Time: 06/05/2023/8:55:21 AM    Final    DG Chest 2 View Result Date: 06/04/2023 CLINICAL DATA:  Intermittent shortness of breath with chest pain for 5 days. EXAM: CHEST - 2 VIEW COMPARISON:  Radiographs 07/16/2022 and 04/29/2020. FINDINGS: The heart size and mediastinal contours are stable. The lungs are mildly hyperinflated but clear. No pleural effusion or pneumothorax. No acute osseous findings are demonstrated. There are mild degenerative changes in the spine. Telemetry leads overlie the chest. IMPRESSION: Stable chest.  No evidence of acute cardiopulmonary process. Electronically Signed   By: Elmon Hagedorn M.D.   On: 06/04/2023 10:50    Microbiology: Results for orders placed or performed during the hospital encounter of  06/04/23  Resp panel by RT-PCR (RSV, Flu A&B, Covid) Anterior Nasal Swab     Status: None   Collection Time: 06/04/23 10:12 AM   Specimen: Anterior Nasal Swab  Result Value Ref Range Status   SARS Coronavirus 2 by RT PCR NEGATIVE NEGATIVE Final    Comment: (NOTE) SARS-CoV-2 target nucleic acids are NOT DETECTED.  The SARS-CoV-2 RNA is generally detectable in upper respiratory specimens during the acute phase of infection. The lowest concentration of SARS-CoV-2 viral copies this assay can detect is 138 copies/mL. A negative result does not preclude SARS-Cov-2 infection and should not be used as the sole basis for treatment or other patient management decisions. A negative result may occur with  improper specimen collection/handling, submission of specimen other than nasopharyngeal swab, presence of viral mutation(s) within the areas targeted by this assay, and inadequate number of viral copies(<138 copies/mL). A negative result must be combined with clinical observations, patient history, and epidemiological information. The expected result is Negative.  Fact Sheet for Patients:  BloggerCourse.com  Fact Sheet for Healthcare Providers:  SeriousBroker.it  This test is no t yet approved or cleared by the [place]  FDA and  has been authorized for detection and/or diagnosis of SARS-CoV-2 by FDA under an Emergency Use Authorization (EUA). This EUA will remain  in effect (meaning this test can be used) for the duration of the COVID-19 declaration under Section 564(b)(1) of the Act, 21 U.S.C.section 360bbb-3(b)(1), unless the authorization is terminated  or revoked sooner.       Influenza  A by PCR NEGATIVE NEGATIVE Final   Influenza B by PCR NEGATIVE NEGATIVE Final    Comment: (NOTE) The Xpert Xpress SARS-CoV-2/FLU/RSV plus assay is intended as an aid in the diagnosis of influenza from Nasopharyngeal swab specimens and should not  be used as a sole basis for treatment. Nasal washings and aspirates are unacceptable for Xpert Xpress SARS-CoV-2/FLU/RSV testing.  Fact Sheet for Patients: BloggerCourse.com  Fact Sheet for Healthcare Providers: SeriousBroker.it  This test is not yet approved or cleared by the [place]  FDA and has been authorized for detection and/or diagnosis of SARS-CoV-2 by FDA under an Emergency Use Authorization (EUA). This EUA will remain in effect (meaning this test can be used) for the duration of the COVID-19 declaration under Section 564(b)(1) of the Act, 21 U.S.C. section 360bbb-3(b)(1), unless the authorization is terminated or revoked.     Resp Syncytial Virus by PCR NEGATIVE NEGATIVE Final    Comment: (NOTE) Fact Sheet for Patients: BloggerCourse.com  Fact Sheet for Healthcare Providers: SeriousBroker.it  This test is not yet approved or cleared by the [place]  FDA and has been authorized for detection and/or diagnosis of SARS-CoV-2 by FDA under an Emergency Use Authorization (EUA). This EUA will remain in effect (meaning this test can be used) for the duration of the COVID-19 declaration under Section 564(b)(1) of the Act, 21 U.S.C. section 360bbb-3(b)(1), unless the authorization is terminated or revoked.  Performed at [facility], [address] [place], [place] [address]     Labs: CBC: Recent Labs  Lab 06/04/23 0950 06/05/23 0550  WBC 10.1 8.2  HGB 13.3 12.3*  HCT 37.9* 34.8*  MCV 92.0 90.6  PLT 214 191   Basic Metabolic Panel: Recent Labs  Lab 06/04/23 0950 06/05/23 0550  NA 133* 134*  K 3.6 3.6  CL 102 105  CO2 20* 21*  GLUCOSE 116* 105*  BUN 17 14  CREATININE 1.02 0.78  CALCIUM 8.7* 8.6*   Liver Function Tests: No results for input(s): "AST", "ALT", "ALKPHOS", "BILITOT", "PROT", "ALBUMIN" in the last 168 hours. CBG: No results  for input(s): "GLUCAP" in the last 168 hours.  Discharge time spent: 29 minutes.  Signed: Jodeane Mulligan, DO [facility] 06/05/2023

## 2023-06-05 NOTE — Progress Notes (Signed)
 Progress Note   Patient: Theodore Wiggins WJX:[id number] DOB: March 22, 1945 DOA: 06/04/2023     0 DOS: the patient was seen and examined on 06/05/2023   Brief hospital course:  78 y.o. male with medical history significant of hypertension, hyperlipidemia presenting with NSTEMI.   Assessment and Plan:  NSTEMI - Recurrent chest pain on presentation.  Initial troponin is greater than 3000, rising to 7770.  Cardiology on board following closely.  Plan for left heart cath later this morning.  Aspirin, statin on board.  Hypertension/hyperlipidemia - Cardiac regimen likely to change.  Awaiting left heart cath.  Subjective: Patient resting comfortably this morning.  Family at bedside including patient's daughter.  Chest pain appears improved, worsening shortness of breath when laying on his side.  Denies any fever, cough, nausea, vomiting, abdominal pain.  Admits to having left heart cath in the early 2000's but no stent was placed.  Reported had adequate collateral circulation.  No history of stent placement.  Physical Exam: Vitals:   06/05/23 1245 06/05/23 1300 06/05/23 1315 06/05/23 1330  BP: 112/75 111/73  116/72  Pulse: (!) 59 65 (!) 59 60  Resp: 14 19 20  (!) 24  Temp:      TempSrc:      SpO2: 93% 96% 96% 94%  Weight:      Height:        GENERAL:  Alert, pleasant, no acute distress  HEENT:  EOMI CARDIOVASCULAR:  RRR, no murmurs appreciated RESPIRATORY:  Clear to auscultation, no wheezing, rales, or rhonchi GASTROINTESTINAL:  Soft, nontender, nondistended EXTREMITIES:  No LE edema bilaterally NEURO:  No new focal deficits appreciated SKIN:  No rashes noted PSYCH:  Appropriate mood and affect    Data Reviewed:  There are no new results to review at this time.  Labs: CBC: Recent Labs  Lab 06/04/23 0950 06/05/23 0550  WBC 10.1 8.2  HGB 13.3 12.3*  HCT 37.9* 34.8*  MCV 92.0 90.6  PLT 214 191   Basic Metabolic Panel: Recent Labs  Lab 06/04/23 0950 06/05/23 0550  NA  133* 134*  K 3.6 3.6  CL 102 105  CO2 20* 21*  GLUCOSE 116* 105*  BUN 17 14  CREATININE 1.02 0.78  CALCIUM 8.7* 8.6*   Liver Function Tests: No results for input(s): "AST", "ALT", "ALKPHOS", "BILITOT", "PROT", "ALBUMIN" in the last 168 hours. CBG: No results for input(s): "GLUCAP" in the last 168 hours.  Scheduled Meds:  [MAR Hold] aspirin EC  81 mg Oral Daily   [MAR Hold] atorvastatin  80 mg Oral Daily   [MAR Hold] loratadine   10 mg Oral Daily   [MAR Hold] metoprolol succinate  12.5 mg Oral Daily   [MAR Hold] sodium chloride  flush  3 mL Intravenous Q12H   sodium chloride  flush  3 mL Intravenous Q12H   Continuous Infusions:  sodium chloride      sodium chloride      sodium chloride  1 mL/kg/hr (06/05/23 1039)   sodium chloride      heparin Stopped (06/05/23 1134)   PRN Meds:.sodium chloride , sodium chloride , [MAR Hold] acetaminophen , fentaNYL , Heparin (Porcine) in NaCl, heparin sodium (porcine), hydrALAZINE, iohexol , labetalol, lidocaine  (PF), midazolam , [MAR Hold] nitroGLYCERIN, [MAR Hold] ondansetron  (ZOFRAN ) IV, sodium chloride  flush, sodium chloride  flush, verapamil  Family Communication: Family at bedside  Disposition: Status is: Inpatient Remains inpatient appropriate because: NSTEMI requiring left heart cath     Time spent: 31 minutes  Author: Jodeane Mulligan, DO 06/05/2023 1:53 PM  For on call review [email or web address].

## 2023-06-05 NOTE — Consult Note (Signed)
 PHARMACY - ANTICOAGULATION CONSULT NOTE  Pharmacy Consult for heparin infusion Indication: chest pain/ACS  Allergies  Allergen Reactions   Gabapentin  Other (See Comments)    Patient Measurements: Height: 5\' 10"  (177.8 cm) Weight: 89.4 kg (197 lb 1.5 oz) IBW/kg (Calculated) : 73 HEPARIN DW (KG): 91.9  Vital Signs: Temp: 98.7 F (37.1 C) (04/30 0443) Temp Source: Oral (04/30 0443) BP: 101/54 (04/30 0443) Pulse Rate: 69 (04/30 0443)  Labs: Recent Labs    06/04/23 0950 06/04/23 1129 06/04/23 1617 06/04/23 1906 06/04/23 2021 06/04/23 2222 06/05/23 0550  HGB 13.3  --   --   --   --   --  12.3*  HCT 37.9*  --   --   --   --   --  34.8*  PLT 214  --   --   --   --   --  191  APTT  --  28  --   --   --   --   --   LABPROT  --  14.2  --   --   --   --   --   INR  --  1.1  --   --   --   --   --   HEPARINUNFRC  --   --   --  <0.10*  --   --  0.24*  CREATININE 1.02  --   --   --   --   --  0.78  TROPONINIHS 3,552* 3,925*   < > 6,484* 7,570* 6,889*  --    < > = values in this interval not displayed.    Estimated Creatinine Clearance: 85.7 mL/min (by C-G formula based on SCr of 0.78 mg/dL).   Medical History: Past Medical History:  Diagnosis Date   Arthritis    Coronary artery disease    DDD (degenerative disc disease), lumbar    DDD (degenerative disc disease), lumbar    Depression    Dyspnea    GERD (gastroesophageal reflux disease)    Hyperlipidemia    Hypertension    NSTEMI (non-ST elevated myocardial infarction) (HCC) 06/04/2023   Post herpetic neuralgia    Vertigo     Medications:  No AC prior to admission  Assessment: 78yo patient with PMH relevant for arthritis, CAD, gastric reflux, hypertension, and hyperlipidemia. Patient admitted with ED with chest pain. Pharmacy consulted to initiate and manage heparin infusion for ACS.  Baseline CBC, aptt and INR appropriate to start heparin infusion.  Goal of Therapy:  Heparin level 0.3-0.7 units/ml Monitor  platelets by anticoagulation protocol: Yes   Plan: heparin level subtherapeutic ---will give 1400 units IV heparin bolus x 1 ---will increase heparin infusion rate to 1700 units/hr ---Recheck heparin level in 8 hours after rate change ---Continue to monitor H&H and platelets  [name], PharmD, [place] [facility] 06/05/2023 7:19 AM
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
## 2023-06-05 NOTE — Progress Notes (Signed)
 Dr. Philippa Bray in at bedside: MD spoke with pt., daughter and pt. S.O. Everyone verbalized understanding of conversation.

## 2023-06-05 NOTE — Plan of Care (Signed)
  Problem: Activity: Goal: Ability to tolerate increased activity will improve Outcome: Progressing   Problem: Cardiac: Goal: Ability to achieve and maintain adequate cardiovascular perfusion will improve Outcome: Progressing   Problem: Clinical Measurements: Goal: Ability to maintain clinical measurements within normal limits will improve Outcome: Progressing Goal: Diagnostic test results will improve Outcome: Progressing Goal: Cardiovascular complication will be avoided Outcome: Progressing   Problem: Coping: Goal: Level of anxiety will decrease Outcome: Progressing   Problem: Pain Managment: Goal: General experience of comfort will improve and/or be controlled Outcome: Progressing

## 2023-06-05 NOTE — Hospital Course (Signed)
  78 y.o. male with medical history significant of hypertension, hyperlipidemia presenting with NSTEMI.   Assessment and Plan:  NSTEMI - Recurrent chest pain on presentation.  Initial troponin is greater than 3000, rising to 7770.  Cardiology on board following closely.  Plan for left heart cath later this morning.  Aspirin, statin on board.  Hypertension/hyperlipidemia - Cardiac regimen likely to change.  Awaiting left heart cath.

## 2023-06-05 NOTE — Progress Notes (Signed)
 Dr. Philippa Bray spoke with pt., his S.O. and daughter re: cath results. All persons involved verbalized understanding of conversation with MD.

## 2023-06-05 NOTE — Progress Notes (Signed)
 Patient refused to sign consent at this time. Still got couple of questions regarding the procedure.

## 2023-06-06 DIAGNOSIS — I214 Non-ST elevation (NSTEMI) myocardial infarction: Secondary | ICD-10-CM | POA: Diagnosis not present

## 2023-06-06 LAB — CBC
HCT: 35.2 % — ABNORMAL LOW (ref 39.0–52.0)
Hemoglobin: 12.6 g/dL — ABNORMAL LOW (ref 13.0–17.0)
MCH: 32.8 pg (ref 26.0–34.0)
MCHC: 35.8 g/dL (ref 30.0–36.0)
MCV: 91.7 fL (ref 80.0–100.0)
Platelets: 210 10*3/uL (ref 150–400)
RBC: 3.84 MIL/uL — ABNORMAL LOW (ref 4.22–5.81)
RDW: 12.7 % (ref 11.5–15.5)
WBC: 7.9 10*3/uL (ref 4.0–10.5)
nRBC: 0 % (ref 0.0–0.2)

## 2023-06-06 LAB — BASIC METABOLIC PANEL WITH GFR
Anion gap: 8 (ref 5–15)
BUN: 12 mg/dL (ref 8–23)
CO2: 19 mmol/L — ABNORMAL LOW (ref 22–32)
Calcium: 8.3 mg/dL — ABNORMAL LOW (ref 8.9–10.3)
Chloride: 107 mmol/L (ref 98–111)
Creatinine, Ser: 0.92 mg/dL (ref 0.61–1.24)
GFR, Estimated: >60 mL/min (ref >60)
Glucose, Bld: 100 mg/dL — ABNORMAL HIGH (ref 70–99)
Potassium: 3.4 mmol/L — ABNORMAL LOW (ref 3.5–5.1)
Sodium: 134 mmol/L — ABNORMAL LOW (ref 135–145)

## 2023-06-06 LAB — HEPARIN LEVEL (UNFRACTIONATED)
Heparin Unfractionated: 0.24 [IU]/mL — ABNORMAL LOW (ref 0.30–0.70)
Heparin Unfractionated: 0.35 [IU]/mL (ref 0.30–0.70)

## 2023-06-06 MED ORDER — POTASSIUM CHLORIDE 20 MEQ PO PACK
40.0000 meq | PACK | Freq: Once | ORAL | Status: AC
  Administered 2023-06-06: 40 meq via ORAL
  Filled 2023-06-06: qty 2

## 2023-06-06 MED ORDER — HEPARIN BOLUS VIA INFUSION
1400.0000 [IU] | Freq: Once | INTRAVENOUS | Status: AC
  Administered 2023-06-06: 1400 [IU] via INTRAVENOUS
  Filled 2023-06-06: qty 1400
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
## 2023-06-06 NOTE — Consult Note (Addendum)
 PHARMACY - ANTICOAGULATION CONSULT NOTE  Pharmacy Consult for heparin  infusion Indication: chest pain/ACS  Allergies  Allergen Reactions   Gabapentin  Other (See Comments)    Patient Measurements: Height: 5\' 10"  (177.8 cm) Weight: 89.4 kg (197 lb 1.5 oz) IBW/kg (Calculated) : 73 HEPARIN  DW (KG): 89.4  Vital Signs: Temp: 99 F (37.2 C) (05/01 1417) Temp Source: Oral (05/01 1417) BP: 129/86 (05/01 1417) Pulse Rate: 67 (05/01 1417)  Labs: Recent Labs    06/04/23 0950 06/04/23 1129 06/04/23 1617 06/04/23 1906 06/04/23 2021 06/04/23 2222 06/05/23 0550 06/05/23 1554 06/06/23 0130 06/06/23 1213  HGB 13.3  --   --   --   --   --  12.3*  --  12.6*  --   HCT 37.9*  --   --   --   --   --  34.8*  --  35.2*  --   PLT 214  --   --   --   --   --  191  --  210  --   APTT  --  28  --   --   --   --   --   --   --   --   LABPROT  --  14.2  --   --   --   --   --   --   --   --   INR  --  1.1  --   --   --   --   --   --   --   --   HEPARINUNFRC  --   --    < > <0.10*  --   --  0.24* <0.10* 0.24* 0.35  CREATININE 1.02  --   --   --   --   --  0.78  --  0.92  --   TROPONINIHS 3,552* 3,925*   < > 6,484* 7,570* 6,889*  --   --   --   --    < > = values in this interval not displayed.    Estimated Creatinine Clearance: 74.5 mL/min (by C-G formula based on SCr of 0.92 mg/dL).   Medical History: Past Medical History:  Diagnosis Date   Arthritis    Coronary artery disease    DDD (degenerative disc disease), lumbar    DDD (degenerative disc disease), lumbar    Depression    Dyspnea    GERD (gastroesophageal reflux disease)    Hyperlipidemia    Hypertension    NSTEMI (non-ST elevated myocardial infarction) (HCC) 06/04/2023   Post herpetic neuralgia    Vertigo     Medications:  No AC prior to admission  Assessment: 78yo patient with PMH relevant for arthritis, CAD, gastric reflux, hypertension, and hyperlipidemia. Patient admitted with ED with chest pain. Pharmacy consulted  to initiate and manage heparin  infusion for ACS. S/p cath with severe two-vessel CAD with culprit in the proximal LAD plan to transfer to [facility].    0501 0800 HL 0.35   Goal of Therapy:  Heparin  level 0.3-0.7 units/ml Monitor platelets by anticoagulation protocol: Yes   Plan:  Heparin  level is therapeutic. Continue heparin  infusion 1900 units/hr. Recheck heparin  level in 8 hours. CBC daily while on heparin .   Harlie Lieu, PharmD, BCPS 06/06/2023 2:30 PM

## 2023-06-06 NOTE — Progress Notes (Signed)
 st

## 2023-06-06 NOTE — Consult Note (Signed)
 PHARMACY - ANTICOAGULATION CONSULT NOTE  Pharmacy Consult for heparin  infusion Indication: chest pain/ACS  Allergies  Allergen Reactions   Gabapentin  Other (See Comments)    Patient Measurements: Height: 5\' 10"  (177.8 cm) Weight: 89.4 kg (197 lb 1.5 oz) IBW/kg (Calculated) : 73 HEPARIN  DW (KG): 89.4  Vital Signs: Temp: 98.8 F (37.1 C) (04/30 2334) Temp Source: Oral (04/30 2334) BP: 121/80 (04/30 2334)  Labs: Recent Labs    06/04/23 0950 06/04/23 1129 06/04/23 1617 06/04/23 1906 06/04/23 2021 06/04/23 2222 06/05/23 0550 06/05/23 1554 06/06/23 0130  HGB 13.3  --   --   --   --   --  12.3*  --  12.6*  HCT 37.9*  --   --   --   --   --  34.8*  --  35.2*  PLT 214  --   --   --   --   --  191  --  210  APTT  --  28  --   --   --   --   --   --   --   LABPROT  --  14.2  --   --   --   --   --   --   --   INR  --  1.1  --   --   --   --   --   --   --   HEPARINUNFRC  --   --    < > <0.10*  --   --  0.24* <0.10* 0.24*  CREATININE 1.02  --   --   --   --   --  0.78  --  0.92  TROPONINIHS 3,552* 3,925*   < > 6,484* 7,570* 6,889*  --   --   --    < > = values in this interval not displayed.    Estimated Creatinine Clearance: 74.5 mL/min (by C-G formula based on SCr of 0.92 mg/dL).   Medical History: Past Medical History:  Diagnosis Date   Arthritis    Coronary artery disease    DDD (degenerative disc disease), lumbar    DDD (degenerative disc disease), lumbar    Depression    Dyspnea    GERD (gastroesophageal reflux disease)    Hyperlipidemia    Hypertension    NSTEMI (non-ST elevated myocardial infarction) (HCC) 06/04/2023   Post herpetic neuralgia    Vertigo     Medications:  No AC prior to admission  Assessment: 78yo patient with PMH relevant for arthritis, CAD, gastric reflux, hypertension, and hyperlipidemia. Patient admitted with ED with chest pain. Pharmacy consulted to initiate and manage heparin  infusion for ACS.  Baseline CBC, aptt and INR  appropriate to start heparin  infusion.  Goal of Therapy:  Heparin  level 0.3-0.7 units/ml Monitor platelets by anticoagulation protocol: Yes   Plan: heparin  level subtherapeutic ---will give 1400 units IV heparin  bolus x 1 ---will increase heparin  infusion rate to 1900 units/hr ---Recheck heparin  level in 8 hours after rate change ---Continue to monitor H&H and platelets  [name], PharmD, [facility] 06/06/2023 3:19 AM

## 2023-06-06 NOTE — Plan of Care (Signed)
  Problem: Education: Goal: Knowledge of General Education information will improve Description: Including pain rating scale, medication(s)/side effects and non-pharmacologic comfort measures Outcome: Progressing   Problem: Clinical Measurements: Goal: Respiratory complications will improve Outcome: Progressing   Problem: Clinical Measurements: Goal: Cardiovascular complication will be avoided Outcome: Progressing   Problem: Activity: Goal: Risk for activity intolerance will decrease Outcome: Progressing   Problem: Pain Managment: Goal: General experience of comfort will improve and/or be controlled Outcome: Progressing   Problem: Safety: Goal: Ability to remain free from injury will improve Outcome: Progressing

## 2023-06-06 NOTE — Plan of Care (Signed)

## 2023-06-06 NOTE — Progress Notes (Signed)
 Progress Note   Patient: Theodore Wiggins ZOX:[id number] DOB: 02/12/1945 DOA: 06/04/2023     1 DOS: the patient was seen and examined on 06/06/2023   Brief hospital course:  78 y.o. male with medical history significant of hypertension, hyperlipidemia presenting with NSTEMI.    Assessment and Plan:   NSTEMI - Recurrent chest pain on presentation.  Initial troponin is greater than 3000, rising to 7770.  Cardiology on board following closely.  Left heart catheter noting heavily calcified proximal LAD lesion.  Subsequent need to transition her to tertiary care facility for complex PCI.  Patient to be transferred to [facility], accepting physician is Dr. Sheela Denmark.  Currently awaiting bed placement.  Likely transfer to [facility] today, plan for complex PCI tomorrow at [facility] with Dr. Philippa Bray.   hypertension/hyperlipidemia - Cardiac regimen likely to change.       Subjective: Patient feeling well this morning.  Excepted for transfer to [facility] yesterday however bed unavailable so far.  Likely stable for transfer to [facility] today, plan for complex PCI tomorrow.  No acute events overnight.  Feeling well, denies any chest pain, shortness of breath, nausea, vomiting, abdominal pain.  Physical Exam: Vitals:   06/05/23 1930 06/05/23 2334 06/06/23 0600 06/06/23 0740  BP: 127/75 121/80 120/86 117/82  Pulse:   97 66  Resp: 20 20 20 20   Temp: 97.8 F (36.6 C) 98.8 F (37.1 C) 98.7 F (37.1 C) 98.7 F (37.1 C)  TempSrc: Oral Oral Oral Oral  SpO2: 99% 97% 98% 97%  Weight:      Height:        GENERAL:  Alert, pleasant, no acute distress  HEENT:  EOMI CARDIOVASCULAR:  RRR, no murmurs appreciated RESPIRATORY:  Clear to auscultation, no wheezing, rales, or rhonchi GASTROINTESTINAL:  Soft, nontender, nondistended EXTREMITIES:  No LE edema bilaterally NEURO:  No new focal deficits appreciated SKIN:  No rashes noted PSYCH:  Appropriate mood and affect    Data Reviewed:  There are no new  results to review at this time.  Labs: CBC: Recent Labs  Lab 06/04/23 0950 06/05/23 0550 06/06/23 0130  WBC 10.1 8.2 7.9  HGB 13.3 12.3* 12.6*  HCT 37.9* 34.8* 35.2*  MCV 92.0 90.6 91.7  PLT 214 191 210   Basic Metabolic Panel: Recent Labs  Lab 06/04/23 0950 06/05/23 0550 06/06/23 0130  NA 133* 134* 134*  K 3.6 3.6 3.4*  CL 102 105 107  CO2 20* 21* 19*  GLUCOSE 116* 105* 100*  BUN 17 14 12   CREATININE 1.02 0.78 0.92  CALCIUM  8.7* 8.6* 8.3*   Liver Function Tests: No results for input(s): "AST", "ALT", "ALKPHOS", "BILITOT", "PROT", "ALBUMIN" in the last 168 hours. CBG: No results for input(s): "GLUCAP" in the last 168 hours.  Scheduled Meds:  aspirin  EC  81 mg Oral Daily   atorvastatin   80 mg Oral Daily   loratadine   10 mg Oral Daily   metoprolol  succinate  12.5 mg Oral Daily   sodium chloride  flush  3 mL Intravenous Q12H   sodium chloride  flush  3 mL Intravenous Q12H   Continuous Infusions:  sodium chloride      heparin  1,900 Units/hr (06/06/23 0653)   PRN Meds:.sodium chloride , acetaminophen , nitroGLYCERIN , ondansetron  (ZOFRAN ) IV, sodium chloride  flush  Family Communication: Family at bedside  Disposition: Status is: Inpatient Remains inpatient appropriate because: Awaiting transfer to [facility]     Time spent: 35 minutes  Author: Jodeane Mulligan, DO 06/06/2023 11:27 AM  For on call  review [email or web address].

## 2023-06-07 LAB — LIPOPROTEIN A (LPA): Lipoprotein (a): 96.2 nmol/L — ABNORMAL HIGH (ref <75.0)

## 2023-07-05 ENCOUNTER — Emergency Department

## 2023-07-05 ENCOUNTER — Emergency Department
Admission: EM | Admit: 2023-07-05 | Discharge: 2023-07-06 | Disposition: A | Discharge status: Other Acute Inpatient Hospital | Attending: Emergency Medicine | Admitting: Emergency Medicine

## 2023-07-05 ENCOUNTER — Other Ambulatory Visit: Payer: Self-pay

## 2023-07-05 DIAGNOSIS — I509 Heart failure, unspecified: Secondary | ICD-10-CM | POA: Insufficient documentation

## 2023-07-05 DIAGNOSIS — I2510 Atherosclerotic heart disease of native coronary artery without angina pectoris: Secondary | ICD-10-CM

## 2023-07-05 DIAGNOSIS — Z9861 Coronary angioplasty status: Secondary | ICD-10-CM

## 2023-07-05 DIAGNOSIS — R0609 Other forms of dyspnea: Secondary | ICD-10-CM | POA: Diagnosis not present

## 2023-07-05 DIAGNOSIS — R0602 Shortness of breath: Secondary | ICD-10-CM | POA: Diagnosis present

## 2023-07-05 DIAGNOSIS — I110 Hypertensive heart disease with heart failure: Secondary | ICD-10-CM | POA: Insufficient documentation

## 2023-07-05 LAB — COMPREHENSIVE METABOLIC PANEL WITH GFR
ALT: 22 U/L (ref 0–44)
AST: 21 U/L (ref 15–41)
Albumin: 4.3 g/dL (ref 3.5–5.0)
Alkaline Phosphatase: 52 U/L (ref 38–126)
Anion gap: 7 (ref 5–15)
BUN: 18 mg/dL (ref 8–23)
CO2: 24 mmol/L (ref 22–32)
Calcium: 9.2 mg/dL (ref 8.9–10.3)
Chloride: 100 mmol/L (ref 98–111)
Creatinine, Ser: 1.04 mg/dL (ref 0.61–1.24)
GFR, Estimated: >60 mL/min (ref >60)
Glucose, Bld: 117 mg/dL — ABNORMAL HIGH (ref 70–99)
Potassium: 3.9 mmol/L (ref 3.5–5.1)
Sodium: 131 mmol/L — ABNORMAL LOW (ref 135–145)
Total Bilirubin: 0.9 mg/dL (ref 0.0–1.2)
Total Protein: 7.5 g/dL (ref 6.5–8.1)

## 2023-07-05 LAB — PROTIME-INR
INR: 1 (ref 0.8–1.2)
Prothrombin Time: 12.9 s (ref 11.4–15.2)

## 2023-07-05 LAB — CBC
HCT: 39.8 % (ref 39.0–52.0)
Hemoglobin: 14.2 g/dL (ref 13.0–17.0)
MCH: 31.6 pg (ref 26.0–34.0)
MCHC: 35.7 g/dL (ref 30.0–36.0)
MCV: 88.6 fL (ref 80.0–100.0)
Platelets: 232 10*3/uL (ref 150–400)
RBC: 4.49 MIL/uL (ref 4.22–5.81)
RDW: 12 % (ref 11.5–15.5)
WBC: 5.8 10*3/uL (ref 4.0–10.5)
nRBC: 0 % (ref 0.0–0.2)

## 2023-07-05 LAB — TROPONIN I (HIGH SENSITIVITY): Troponin I (High Sensitivity): 8 ng/L (ref <18)

## 2023-07-05 MED ORDER — FUROSEMIDE 10 MG/ML IJ SOLN
40.0000 mg | Freq: Once | INTRAMUSCULAR | Status: DC
  Filled 2023-07-05: qty 4

## 2023-07-05 NOTE — ED Notes (Signed)
 Bed assignment: [facility], bed [phone]   Report given to [facility], RN.   [facility] to call back when a truck is available for transport.

## 2023-07-05 NOTE — ED Notes (Signed)
 Spoke with Jaclyn at [facility] Transfer center for an transfer for ED 13 Mr. Theodore Wiggins. faxed off the face sheet and power shared the images.

## 2023-07-05 NOTE — Consult Note (Signed)
 Initial Consultation Note   Patient: Theodore Wiggins QIO:[id number] DOB: 03-19-1945 PCP: Little Riff, MD DOA: 07/05/2023 DOS: the patient was seen and examined on 07/05/2023 Primary service: Bryson Carbine, MD  Referring physician: Dr. Martina Sledge Reason for consult: shortness of breath not relieved with increase furosemide  outpatient,  more prominent T wave inversions in anterior leads  I have personally briefly reviewed patient's old medical records in [facility] EMR.  Chief Concern: Shortness of breath  HPI: Theodore Wiggins is a 78 year old male with history of CAD status post stent placement, heart failure reduced ejection fraction, ischemic cardiomyopathy, hypertension, hyperlipidemia, who presents emergency department for chief concerns of shortness of breath.  Vitals in the ED showed T of 97.4, rr 24, heart rate 70, blood pressure 97/68, SpO2 of 100% on room air.  Serum sodium is 131, potassium 3.9, chloride 100, bicarb 24, BUN of 18, serum creatinine 1.04, EGFR greater than 60, nonfasting blood glucose 117, WBC 5.8, hemoglobin 14.2, platelets of 232.  HS troponin is 8.  ED treatment: Furosemide  40 mg IV one-time dose. ----------------------------------- At bedside, patient was able to tell me his first and last name, age, location, current calendar year.  He denies trauma to his person.  He reports last night he woke up with extreme shortness of breath like he was going to die.  He just wants to breathe better again.  Patient initially did not want to go back to [facility] however after discussion with myself, patient has agreed that [facility] would be the best course at this time  Social history: Lives at home with his significant other.  ROS: Constitutional: no weight change, no fever ENT/Mouth: no sore throat, no rhinorrhea Eyes: no eye pain, no vision changes Cardiovascular: no chest pain, + dyspnea,  no edema, no palpitations Respiratory: no cough, no sputum, no  wheezing Gastrointestinal: no nausea, no vomiting, no diarrhea, no constipation Genitourinary: no urinary incontinence, no dysuria, no hematuria Musculoskeletal: no arthralgias, no myalgias Skin: no skin lesions, no pruritus, Neuro: + weakness, no loss of consciousness, no syncope Psych: no anxiety, no depression, + decrease appetite Heme/Lymph: no bruising, no bleeding  ED Course: Discussed with EDP, requesting hospitalist admission for concerns of heart catheterization in setting of new more prominent T wave inversions in the anterior leads.  Assessment/Plan  Active Problems:   Shortness of breath   CAD S/P percutaneous coronary angioplasty    Assessment and Plan:  CAD S/P percutaneous coronary angioplasty Status post left heart cath at [facility] on 4/34/25 revealing complex PCI Patient was ultimately transferred to [facility] status post left heart cath with DES to LAD on 06/07/2023.  Patient was discharged with aspirin  and ticagrelor for 12 months uninterrupted, started on Coreg and spironolactone.  Shortness of breath With more prominent T wave inversion in anterior leads on EKG Case discussed with the ED provider, I believe that given patient had to be transferred to [facility] due to complex PCI need at end of April of this year I recommend patient be transferred to [facility] for further evaluation by cardiology team.  Chart reviewed.   DVT prophylaxis: Per primary team Code Status: Full code Diet: Per primary Family Communication: Extensive discussion with daughter and significant other at bedside with patient's permission Disposition Plan: Pending course, guarded prognosis Consults called: None at this time Admission status: Recommend ED to ED transfer to [facility] for cardiology evaluation  Past Medical History:  Diagnosis Date   Arthritis    Coronary artery disease  DDD (degenerative disc disease), lumbar    DDD (degenerative disc disease), lumbar    Depression    Dyspnea    GERD  (gastroesophageal reflux disease)    Hyperlipidemia    Hypertension    NSTEMI (non-ST elevated myocardial infarction) (HCC) 06/04/2023   Post herpetic neuralgia    Vertigo    Past Surgical History:  Procedure Laterality Date   CARDIOVASCULAR STRESS TEST     CATARACT EXTRACTION, BILATERAL     ELBOW SURGERY Left    untwisted nerves. 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 His smokeless tobacco use includes snuff. He reports that he does not drink alcohol  and does not use drugs.  Allergies  Allergen Reactions   Gabapentin  Other (See Comments)   Family History  Problem Relation Age of Onset   CVA Mother    Brain cancer Mother    Heart attack Father 63   Diabetes Brother 29   Family history: Family history reviewed and not pertinent.  Prior to Admission medications   Medication Sig Start Date End Date Taking? Authorizing Provider  aspirin  81 MG chewable tablet Chew 81 mg by mouth. 06/10/23 06/09/24 Yes [provider]  carvedilol (COREG) 6.25 MG tablet Take 6.25 mg by mouth. 06/09/23 06/08/24 Yes [provider]  furosemide  (LASIX ) 40 MG tablet Take 40 mg by mouth daily. 06/26/23 06/25/24 Yes [provider]  nitroGLYCERIN  (NITROSTAT ) 0.4 MG SL tablet Place 0.4 mg under the tongue every 5 (five) minutes as needed for chest pain. 06/11/23 06/10/24 Yes [provider]  omeprazole (PRILOSEC) 20 MG capsule Take 20 mg by mouth daily. 06/09/23  Yes [provider]  rosuvastatin (CRESTOR) 20 MG tablet Take 1 tablet by mouth at bedtime. 06/09/23 06/08/24 Yes [provider]  sacubitril-valsartan (ENTRESTO) 24-26 MG Take 1 tablet by mouth every 12 (twelve) hours. 06/13/23  Yes [provider]  spironolactone (ALDACTONE) 25 MG tablet Take 12.5 mg by mouth. 07/03/23 07/02/24 Yes [provider]  ticagrelor (BRILINTA) 90 MG TABS tablet Take 90 mg by mouth 2 (two) times daily. 07/03/23 07/02/24 Yes [provider]   Physical Exam: Vitals:   07/05/23 1244 07/05/23 1600 07/05/23 1606 07/05/23 1730  BP:  (!) 127/93  106/81  Pulse:  65  64  Resp:  19  13  Temp:      TempSrc:      SpO2:  100% 100% 100%  Weight: 93.4 kg     Height: 5\' 9"  (1.753 m)      Constitutional: appears age-appropriate, calm Eyes: PERRL, lids and conjunctivae normal ENMT: Mucous membranes are moist. Posterior pharynx clear of any exudate or lesions. Age-appropriate dentition. Hearing appropriate Neck: normal, supple, no masses, no thyromegaly Respiratory: clear to auscultation bilaterally, no wheezing, no crackles. Normal respiratory effort. No accessory muscle use.  Cardiovascular: Regular rate and rhythm, no murmurs /  rubs / gallops. No extremity edema. 2+ pedal pulses. No carotid bruits.  Abdomen: no tenderness, no masses palpated, no hepatosplenomegaly. Bowel sounds positive.  Musculoskeletal: no clubbing / cyanosis. No joint deformity upper and lower extremities. Good ROM, no contractures, no atrophy. Normal muscle tone.  Skin: no rashes, lesions, ulcers. No induration Neurologic: Sensation intact. Strength 5/5 in all 4.  Psychiatric: Normal judgment and insight. Alert and oriented x 3. Normal mood.   EKG: independently reviewed, showing sinus rhythm with rate of 61, QTc 477 with new more prominent T wave inversion in lead V1 to V3  Chest x-ray on  Admission: I personally reviewed and I agree with radiologist reading as below.  DG Chest 2 View Result Date: 07/05/2023 CLINICAL DATA:  Dyspnea, Chest pain EXAM: CHEST - 2 VIEW COMPARISON:  June 04, 2023 FINDINGS: Mild biapical pleural thickening. No focal airspace consolidation, pleural effusion, or pneumothorax. No cardiomegaly. Tortuous aorta. No acute fracture or destructive lesion. Multilevel degenerative disc disease of the spine. IMPRESSION: No acute cardiopulmonary abnormality. Electronically Signed   By: Rance Burrows M.D.   On: 07/05/2023 15:37   Labs on Admission: I have personally reviewed following labs  CBC: Recent Labs  Lab 07/05/23 1248  WBC 5.8  HGB 14.2  HCT 39.8  MCV 88.6  PLT 232   Basic Metabolic Panel: Recent Labs  Lab 07/05/23 1248  NA 131*  K 3.9  CL 100  CO2 24  GLUCOSE 117*  BUN 18  CREATININE 1.04  CALCIUM  9.2   GFR: Estimated Creatinine Clearance: 66.1 mL/min (by C-G formula based on SCr of 1.04 mg/dL).  Liver Function Tests: Recent Labs  Lab 07/05/23 1248  AST 21  ALT 22  ALKPHOS 52  BILITOT 0.9  PROT 7.5  ALBUMIN 4.3   Coagulation Profile: Recent Labs  Lab 07/05/23 1248  INR 1.0   Urine analysis:    Component Value Date/Time   COLORURINE YELLOW (A) 10/01/2017 1009   APPEARANCEUR CLEAR (A) 10/01/2017 1009   LABSPEC 1.008 10/01/2017 1009   PHURINE 8.0 10/01/2017 1009   GLUCOSEU NEGATIVE 10/01/2017 1009   HGBUR NEGATIVE 10/01/2017 1009   BILIRUBINUR NEGATIVE 10/01/2017 1009   KETONESUR NEGATIVE 10/01/2017 1009   PROTEINUR NEGATIVE 10/01/2017 1009   NITRITE NEGATIVE 10/01/2017 1009   LEUKOCYTESUR NEGATIVE 10/01/2017 1009   This document was prepared using Dragon Voice Recognition software and may include unintentional dictation errors.  Dr. Reinhold Carbine [facility]  If 7PM-7AM, please contact overnight-coverage provider If 7AM-7PM, please contact day attending provider [email or web address]  07/05/2023, 5:57 PM

## 2023-07-05 NOTE — ED Notes (Signed)
  Spoke with Theodore Wiggins at [facility] Transfer center for an transfer for ED 13 Mr. Theodore Wiggins. faxed off the face sheet and power shared the images.

## 2023-07-05 NOTE — ED Notes (Addendum)
 6.25mg  coreg, 90mg  Brilinta, and 24-26mg  Entresto taken at 2045. 20mg  Crestor taken at 2155 from home supply with verbal permission from MD [employer].

## 2023-07-05 NOTE — ED Provider Notes (Signed)
 [facility] Provider Note    Event Date/Time   First MD Initiated Contact with Patient 07/05/23 1551     (approximate)   History   Shortness of Breath   HPI  Theodore Wiggins is a 78 y.o. male with a history of CAD, hypertension who presents with complaints of shortness of breath.  Patient had complicated PCI performed at the beginning of this month at [facility], was initially seen here for NSTEMI and had cardiac catheterization but was then transferred to [facility] for procedure.  He reports he has been having worsening shortness of breath with any exertion, significant shortness of breath at night when trying to sleep.  Saw his cardiologist 2 days ago who felt repeat cath is necessary but he reports symptoms has been worsening     Physical Exam   Triage Vital Signs: ED Triage Vitals  Encounter Vitals Group     BP 07/05/23 1243 97/68     Systolic BP Percentile --      Diastolic BP Percentile --      Pulse Rate 07/05/23 1243 71     Resp 07/05/23 1243 18     Temp 07/05/23 1243 (!) 97.4 F (36.3 C)     Temp Source 07/05/23 1243 Oral     SpO2 07/05/23 1243 100 %     Weight 07/05/23 1244 93.4 kg (206 lb)     Height 07/05/23 1244 1.753 m (5\' 9" )     Head Circumference --      Peak Flow --      Pain Score 07/05/23 1244 6     Pain Loc --      Pain Education --      Exclude from Growth Chart --     Most recent vital signs: Vitals:   07/05/23 1606 07/05/23 1730  BP:  106/81  Pulse:  64  Resp:  13  Temp:    SpO2: 100% 100%     General: Awake, no distress.  CV:  Good peripheral perfusion.  Resp:  Normal effort.  Clear to auscultation Abd:  No distention.  Other:  No lower extremity edema   ED Results / Procedures / Treatments   Labs (all labs ordered are listed, but only abnormal results are displayed) Labs Reviewed  COMPREHENSIVE METABOLIC PANEL WITH GFR - Abnormal; Notable for the following components:      Result Value   Sodium 131 (*)     Glucose, Bld 117 (*)    All other components within normal limits  CBC  PROTIME-INR  TROPONIN I (HIGH SENSITIVITY)     EKG  ED ECG REPORT I, Bryson Carbine, the attending physician, personally viewed and interpreted this ECG.  Date: 07/05/2023  Rhythm: normal sinus rhythm QRS Axis: normal Intervals: Abnormal ST/T Wave abnormalities: T wave depressions laterally     RADIOLOGY Chest x-ray without acute abnormality    PROCEDURES:  Critical Care performed:   Procedures   MEDICATIONS ORDERED IN ED: Medications  furosemide  (LASIX ) injection 40 mg (40 mg Intravenous Patient Refused/Not Given 07/05/23 1740)     IMPRESSION / MDM / ASSESSMENT AND PLAN / ED COURSE  I reviewed the triage vital signs and the nursing notes. Patient's presentation is most consistent with acute presentation with potential threat to life or bodily function.  Patient presents with worsening shortness of breath in the setting of recent PCI.  During the hospitalization per review of records he did have EF of 30% required IV Lasix .  He has been taking p.o. Lasix  and recently had his dose doubled which does not seem to be helping.  Lab work here is overall reassuring, high sensitive troponin is normal.   Accepted in transfer to [facility] by Dr. Reather Campbell       FINAL CLINICAL IMPRESSION(S) / ED DIAGNOSES   Final diagnoses:  Acute congestive heart failure, unspecified heart failure type [facility])     Rx / DC Orders   ED Discharge Orders     None        Note:  This document was prepared using Dragon voice recognition software and may include unintentional dictation errors.   Bryson Carbine, MD 07/05/23 [name]

## 2023-07-05 NOTE — Hospital Course (Signed)
 Mr. Theodore Wiggins is a 78 year old male with history of CAD status post stent placement, heart failure reduced ejection fraction, ischemic cardiomyopathy, hypertension, hyperlipidemia, who presents emergency department for chief concerns of shortness of breath.  Vitals in the ED showed T of 97.4, rr 24, heart rate 70, blood pressure 97/68, SpO2 of 100% on room air.  Serum sodium is 131, potassium 3.9, chloride 100, bicarb 24, BUN of 18, serum creatinine 1.04, EGFR greater than 60, nonfasting blood glucose 117, WBC 5.8, hemoglobin 14.2, platelets of 232.  HS troponin is 8.  ED treatment: Furosemide  40 mg IV one-time dose.

## 2023-07-05 NOTE — Assessment & Plan Note (Signed)
 With more prominent T wave inversion in anterior leads on EKG Case discussed with the ED provider, I believe that given patient had to be transferred to [facility] due to complex PCI need at end of April of this year I recommend patient be transferred to [facility] for further evaluation by cardiology team.

## 2023-07-05 NOTE — Assessment & Plan Note (Addendum)
 Status post left heart cath at [facility] on 4/34/25 revealing complex PCI Patient was ultimately transferred to [facility] status post left heart cath with DES to LAD on 06/07/2023.  Patient was discharged with aspirin  and ticagrelor for 12 months uninterrupted, started on Coreg and spironolactone.

## 2023-07-05 NOTE — ED Triage Notes (Signed)
 Pt presents to the ED POV from home with daughter. Pt was admitted on 4/29 for an NSTEMI. Pt had stents placed. Pt has been short of breath since and started having some chest pain. Pt took nitroglycerin  pta.

## 2023-07-06 NOTE — ED Notes (Addendum)
 Called Daughter "Heidi Llamas" to update her on plan of care per Patient request.

## 2023-07-06 NOTE — ED Notes (Signed)
 EMTALA reviewed by [employer], RN

## 2023-07-06 NOTE — ED Notes (Signed)
 Patient signed consent to transfer on paper.

## 2023-07-06 NOTE — ED Provider Notes (Signed)
-----------------------------------------   6:41 AM on 07/06/2023 -----------------------------------------   No events overnight. Patient remains in the ED pending bed availability at [facility] for transfer.   Rees Matura J, MD 07/06/23 [phone]

## 2023-09-06 ENCOUNTER — Encounter: Attending: Cardiology

## 2023-09-06 ENCOUNTER — Other Ambulatory Visit: Payer: Self-pay

## 2023-09-06 DIAGNOSIS — Z48812 Encounter for surgical aftercare following surgery on the circulatory system: Secondary | ICD-10-CM | POA: Insufficient documentation

## 2023-09-06 DIAGNOSIS — Z955 Presence of coronary angioplasty implant and graft: Secondary | ICD-10-CM | POA: Insufficient documentation

## 2023-09-06 DIAGNOSIS — I252 Old myocardial infarction: Secondary | ICD-10-CM | POA: Insufficient documentation

## 2023-09-06 DIAGNOSIS — I214 Non-ST elevation (NSTEMI) myocardial infarction: Secondary | ICD-10-CM

## 2023-09-06 NOTE — Progress Notes (Signed)
 Virtual Visit completed. Patient informed on EP and RD appointment and 6 Minute walk test. Patient also informed of patient health questionnaires on My Chart. Patient Verbalizes understanding. Visit diagnosis can be found in CHL 4//29/2025.

## 2023-09-17 ENCOUNTER — Encounter

## 2023-09-17 VITALS — Ht 70.1 in | Wt 208.1 lb

## 2023-09-17 DIAGNOSIS — Z955 Presence of coronary angioplasty implant and graft: Secondary | ICD-10-CM

## 2023-09-17 DIAGNOSIS — I252 Old myocardial infarction: Secondary | ICD-10-CM | POA: Diagnosis present

## 2023-09-17 DIAGNOSIS — I214 Non-ST elevation (NSTEMI) myocardial infarction: Secondary | ICD-10-CM

## 2023-09-17 DIAGNOSIS — Z48812 Encounter for surgical aftercare following surgery on the circulatory system: Secondary | ICD-10-CM | POA: Diagnosis not present

## 2023-09-17 NOTE — Patient Instructions (Signed)
 Patient Instructions  Patient Details  Name: Theodore Wiggins MRN: [id number] Date of Birth: 02-06-1945 Referring Provider:  Wilburn Keller BROCKS, MD  Below are your personal goals for exercise, nutrition, and risk factors. Our goal is to help you stay on track towards obtaining and maintaining these goals. We will be discussing your progress on these goals with you throughout the program.  Initial Exercise Prescription:  Initial Exercise Prescription - 09/17/23 1500       Date of Initial Exercise RX and Referring Provider   Date 09/17/23    Referring Provider Dr. Keller Wilburn      Oxygen   Maintain Oxygen Saturation 88% or higher      Treadmill   MPH 1.7    Grade 0    Minutes 15    METs 2.3      Recumbant Bike   Level 2    RPM 50    Watts 25    Minutes 15    METs 1.5      T5 Nustep   Level 2    SPM 80    Minutes 15    METs 1.5      Track   Laps 24    Minutes 15    METs 2.31      Prescription Details   Duration Progress to 30 minutes of continuous aerobic without signs/symptoms of physical distress      Intensity   THRR 40-80% of Max Heartrate 94-126    Ratings of Perceived Exertion 11-13    Perceived Dyspnea 0-4      Progression   Progression Continue to progress workloads to maintain intensity without signs/symptoms of physical distress.      Resistance Training   Training Prescription Yes    Weight 4lb    Reps 10-15          Exercise Goals: Frequency: Be able to perform aerobic exercise two to three times per week in program working toward 2-5 days per week of home exercise.  Intensity: Work with a perceived exertion of 11 (fairly light) - 15 (hard) while following your exercise prescription.  We will make changes to your prescription with you as you progress through the program.   Duration: Be able to do 30 to 45 minutes of continuous aerobic exercise in addition to a 5 minute warm-up and a 5 minute cool-down routine.   Nutrition Goals: Your  personal nutrition goals will be established when you do your nutrition analysis with the dietician.  The following are general nutrition guidelines to follow: Cholesterol < 200mg /day Sodium < 1500mg /day Fiber: Men over 50 yrs - 30 grams per day  Personal Goals:  Personal Goals and Risk Factors at Admission - 09/06/23 1014       Core Components/Risk Factors/Patient Goals on Admission    Weight Management Yes;Weight Loss    Intervention Weight Management: Develop a combined nutrition and exercise program designed to reach desired caloric intake, while maintaining appropriate intake of nutrient and fiber, sodium and fats, and appropriate energy expenditure required for the weight goal.;Weight Management: Provide education and appropriate resources to help participant work on and attain dietary goals.;Weight Management/Obesity: Establish reasonable short term and long term weight goals.    Expected Outcomes Short Term: Continue to assess and modify interventions until short term weight is achieved;Long Term: Adherence to nutrition and physical activity/exercise program aimed toward attainment of established weight goal;Weight Loss: Understanding of general recommendations for a balanced deficit meal plan, which promotes 1-2 lb  weight loss per week and includes a negative energy balance of [phone] kcal/d;Understanding recommendations for meals to include 15-35% energy as protein, 25-35% energy from fat, 35-60% energy from carbohydrates, less than 200mg  of dietary cholesterol, 20-35 gm of total fiber daily;Understanding of distribution of calorie intake throughout the day with the consumption of 4-5 meals/snacks    Hypertension Yes    Intervention Provide education on lifestyle modifcations including regular physical activity/exercise, weight management, moderate sodium restriction and increased consumption of fresh fruit, vegetables, and low fat dairy, alcohol  moderation, and smoking cessation.;Monitor  prescription use compliance.    Expected Outcomes Short Term: Continued assessment and intervention until BP is < 140/76mm HG in hypertensive participants. < 130/47mm HG in hypertensive participants with diabetes, heart failure or chronic kidney disease.;Long Term: Maintenance of blood pressure at goal levels.    Lipids Yes    Intervention Provide education and support for participant on nutrition & aerobic/resistive exercise along with prescribed medications to achieve LDL 70mg , HDL >40mg .    Expected Outcomes Short Term: Participant states understanding of desired cholesterol values and is compliant with medications prescribed. Participant is following exercise prescription and nutrition guidelines.;Long Term: Cholesterol controlled with medications as prescribed, with individualized exercise RX and with personalized nutrition plan. Value goals: LDL < 70mg , HDL > 40 mg.          Exercise Goals and Review:  Exercise Goals     Row Name 09/17/23 1515             Exercise Goals   Increase Physical Activity Yes       Intervention Provide advice, education, support and counseling about physical activity/exercise needs.;Develop an individualized exercise prescription for aerobic and resistive training based on initial evaluation findings, risk stratification, comorbidities and participant's personal goals.       Expected Outcomes Short Term: Attend rehab on a regular basis to increase amount of physical activity.;Long Term: Add in home exercise to make exercise part of routine and to increase amount of physical activity.;Long Term: Exercising regularly at least 3-5 days a week.       Increase Strength and Stamina Yes       Intervention Provide advice, education, support and counseling about physical activity/exercise needs.;Develop an individualized exercise prescription for aerobic and resistive training based on initial evaluation findings, risk stratification, comorbidities and participant's  personal goals.       Expected Outcomes Short Term: Increase workloads from initial exercise prescription for resistance, speed, and METs.;Short Term: Perform resistance training exercises routinely during rehab and add in resistance training at home;Long Term: Improve cardiorespiratory fitness, muscular endurance and strength as measured by increased METs and functional capacity ( )       Able to understand and use rate of perceived exertion (RPE) scale Yes       Intervention Provide education and explanation on how to use RPE scale       Expected Outcomes Short Term: Able to use RPE daily in rehab to express subjective intensity level;Long Term:  Able to use RPE to guide intensity level when exercising independently       Able to understand and use Dyspnea scale Yes       Intervention Provide education and explanation on how to use Dyspnea scale       Expected Outcomes Short Term: Able to use Dyspnea scale daily in rehab to express subjective sense of shortness of breath during exertion;Long Term: Able to use Dyspnea scale to guide intensity level when exercising independently  Knowledge and understanding of Target Heart Rate Range (THRR) Yes       Intervention Provide education and explanation of THRR including how the numbers were predicted and where they are located for reference       Expected Outcomes Short Term: Able to use daily as guideline for intensity in rehab;Short Term: Able to state/look up THRR;Long Term: Able to use THRR to govern intensity when exercising independently       Able to check pulse independently Yes       Intervention Provide education and demonstration on how to check pulse in carotid and radial arteries.;Review the importance of being able to check your own pulse for safety during independent exercise       Expected Outcomes Short Term: Able to explain why pulse checking is important during independent exercise;Long Term: Able to check pulse independently and  accurately       Understanding of Exercise Prescription Yes       Intervention Provide education, explanation, and written materials on patient's individual exercise prescription       Expected Outcomes Short Term: Able to explain program exercise prescription;Long Term: Able to explain home exercise prescription to exercise independently          Copy of goals given to participant.

## 2023-09-17 NOTE — Progress Notes (Signed)
 Cardiac Individual Treatment Plan  Patient Details  Name: Theodore Wiggins MRN: [id number] Date of Birth: October 05, 1945 Referring Provider:   Flowsheet Row Cardiac Rehab from 09/17/2023 in [facility] Cardiac and Pulmonary Rehab  Referring Provider Dr. Keller Paterson    Initial Encounter Date:  Flowsheet Row Cardiac Rehab from 09/17/2023 in [facility] Cardiac and Pulmonary Rehab  Date 09/17/23    Visit Diagnosis: NSTEMI (non-ST elevated myocardial infarction) [facility])  Status post coronary artery stent placement  Patient's Home Medications on Admission:  Current Outpatient Medications:    aspirin  81 MG chewable tablet, Chew 81 mg by mouth. (Patient not taking: Reported on 09/06/2023), Disp: , Rfl:    aspirin  81 MG chewable tablet, Chew 81 mg by mouth., Disp: , Rfl:    carvedilol (COREG) 6.25 MG tablet, Take 6.25 mg by mouth., Disp: , Rfl:    clopidogrel (PLAVIX) 75 MG tablet, Take 75 mg by mouth., Disp: , Rfl:    furosemide  (LASIX ) 20 MG tablet, Take 20 mg by mouth daily. (Patient not taking: Reported on 09/06/2023), Disp: , Rfl:    furosemide  (LASIX ) 40 MG tablet, Take 40 mg by mouth daily. (Patient not taking: Reported on 09/06/2023), Disp: , Rfl:    Multiple Vitamins-Minerals (CENTRUM SILVER 50+MEN PO), Take by mouth., Disp: , Rfl:    nitroGLYCERIN  (NITROSTAT ) 0.4 MG SL tablet, Place 0.4 mg under the tongue every 5 (five) minutes as needed for chest pain., Disp: , Rfl:    nitroGLYCERIN  (NITROSTAT ) 0.4 MG SL tablet, Place 0.4 mg under the tongue. (Patient not taking: Reported on 09/06/2023), Disp: , Rfl:    omeprazole (PRILOSEC) 20 MG capsule, Take 20 mg by mouth daily. (Patient not taking: Reported on 09/06/2023), Disp: , Rfl:    pantoprazole (PROTONIX) 40 MG tablet, Take 40 mg by mouth., Disp: , Rfl:    rosuvastatin (CRESTOR) 20 MG tablet, Take 1 tablet by mouth at bedtime., Disp: , Rfl:    sacubitril-valsartan (ENTRESTO) 24-26 MG, Take 1 tablet by mouth every 12 (twelve) hours., Disp: , Rfl:    spironolactone  (ALDACTONE) 25 MG tablet, Take 12.5 mg by mouth., Disp: , Rfl:    ticagrelor (BRILINTA) 90 MG TABS tablet, Take 90 mg by mouth 2 (two) times daily. (Patient not taking: Reported on 09/06/2023), Disp: , Rfl:   Past Medical History: Past Medical History:  Diagnosis Date   Arthritis    Coronary artery disease    DDD (degenerative disc disease), lumbar    DDD (degenerative disc disease), lumbar    Depression    Dyspnea    GERD (gastroesophageal reflux disease)    Hyperlipidemia    Hypertension    NSTEMI (non-ST elevated myocardial infarction) (HCC) 06/04/2023   Post herpetic neuralgia    Vertigo     Tobacco Use: Social History   Tobacco Use  Smoking Status Never  Smokeless Tobacco Current   Types: Snuff    Labs: Review Flowsheet       Latest Ref Rng & Units 10/01/2017 04/29/2020 06/04/2023  Labs for ITP Cardiac and Pulmonary Rehab  Cholestrol 0 - 200 mg/dL - - 885   LDL (calc) 0 - 99 mg/dL - - 63   HDL-C >59 mg/dL - - 34   Trlycerides <849 mg/dL - - 84   Hemoglobin J8r 4.8 - 5.6 % 5.5  - 5.5   O2 Saturation % - 97.1  -     Exercise Target Goals: Exercise Program Goal: Individual exercise prescription set using results from initial 6 min walk  test and THRR while considering  patient's activity barriers and safety.   Exercise Prescription Goal: Initial exercise prescription builds to 30-45 minutes a day of aerobic activity, 2-3 days per week.  Home exercise guidelines will be given to patient during program as part of exercise prescription that the participant will acknowledge.   Education: Aerobic Exercise: - Group verbal and visual presentation on the components of exercise prescription. Introduces F.I.T.T principle from ACSM for exercise prescriptions.  Reviews F.I.T.T. principles of aerobic exercise including progression. Written material provided at class time. Flowsheet Row Cardiac Rehab from 09/17/2023 in [facility] Cardiac and Pulmonary Rehab  Education need identified  09/17/23    Education: Resistance Exercise: - Group verbal and visual presentation on the components of exercise prescription. Introduces F.I.T.T principle from ACSM for exercise prescriptions  Reviews F.I.T.T. principles of resistance exercise including progression. Written material provided at class time.    Education: Exercise & Equipment Safety: - Individual verbal instruction and demonstration of equipment use and safety with use of the equipment. Flowsheet Row Cardiac Rehab from 09/17/2023 in [facility] Cardiac and Pulmonary Rehab  Date 09/17/23  Educator [facility]  Instruction Review Code 1- Verbalizes Understanding    Education: Exercise Physiology & General Exercise Guidelines: - Group verbal and written instruction with models to review the exercise physiology of the cardiovascular system and associated critical values. Provides general exercise guidelines with specific guidelines to those with heart or lung disease. Written material provided at class time. Flowsheet Row Cardiac Rehab from 09/17/2023 in [facility] Cardiac and Pulmonary Rehab  Education need identified 09/17/23    Education: Flexibility, Balance, Mind/Body Relaxation: - Group verbal and visual presentation with interactive activity on the components of exercise prescription. Introduces F.I.T.T principle from ACSM for exercise prescriptions. Reviews F.I.T.T. principles of flexibility and balance exercise training including progression. Also discusses the mind body connection.  Reviews various relaxation techniques to help reduce and manage stress (i.e. Deep breathing, progressive muscle relaxation, and visualization). Balance handout provided to take home. Written material provided at class time.   Activity Barriers & Risk Stratification:   6 Minute Walk:  6 Minute Walk     Row Name 09/17/23 1511         6 Minute Walk   Phase Initial     Distance 920 feet     Walk Time 6 minutes     # of Rest Breaks 0     MPH 1.74      METS 1.5     RPE 13     Perceived Dyspnea  2.5     VO2 Peak 5.25     Symptoms Yes (comment)     Comments SOB     Resting HR 62 bpm     Resting BP 122/70     Resting Oxygen Saturation  97 %     Exercise Oxygen Saturation  during 6 min walk 100 %     Max Ex. HR 75 bpm     Max Ex. BP 138/64     2 Minute Post BP 116/66        Oxygen Initial Assessment:   Oxygen Re-Evaluation:   Oxygen Discharge (Final Oxygen Re-Evaluation):   Initial Exercise Prescription:  Initial Exercise Prescription - 09/17/23 1500       Date of Initial Exercise RX and Referring Provider   Date 09/17/23    Referring Provider Dr. Keller Paterson      Oxygen   Maintain Oxygen Saturation 88% or higher  Treadmill   MPH 1.7    Grade 0    Minutes 15    METs 2.3      Recumbant Bike   Level 2    RPM 50    Watts 25    Minutes 15    METs 1.5      T5 Nustep   Level 2    SPM 80    Minutes 15    METs 1.5      Track   Laps 24    Minutes 15    METs 2.31      Prescription Details   Duration Progress to 30 minutes of continuous aerobic without signs/symptoms of physical distress      Intensity   THRR 40-80% of Max Heartrate 94-126    Ratings of Perceived Exertion 11-13    Perceived Dyspnea 0-4      Progression   Progression Continue to progress workloads to maintain intensity without signs/symptoms of physical distress.      Resistance Training   Training Prescription Yes    Weight 4lb    Reps 10-15          Perform Capillary Blood Glucose checks as needed.  Exercise Prescription Changes:   Exercise Prescription Changes     Row Name 09/17/23 1500             Response to Exercise   Blood Pressure (Admit) 122/70       Blood Pressure (Exercise) 138/64       Blood Pressure (Exit) 116/66       Heart Rate (Admit) 62 bpm       Heart Rate (Exercise) 75 bpm       Heart Rate (Exit) 61 bpm       Oxygen Saturation (Admit) 97 %       Oxygen Saturation (Exercise) 100 %        Oxygen Saturation (Exit) 100 %       Rating of Perceived Exertion (Exercise) 13       Perceived Dyspnea (Exercise) 2.5       Symptoms SOB       Comments results          Exercise Comments:   Exercise Goals and Review:   Exercise Goals     Row Name 09/17/23 1515             Exercise Goals   Increase Physical Activity Yes       Intervention Provide advice, education, support and counseling about physical activity/exercise needs.;Develop an individualized exercise prescription for aerobic and resistive training based on initial evaluation findings, risk stratification, comorbidities and participant's personal goals.       Expected Outcomes Short Term: Attend rehab on a regular basis to increase amount of physical activity.;Long Term: Add in home exercise to make exercise part of routine and to increase amount of physical activity.;Long Term: Exercising regularly at least 3-5 days a week.       Increase Strength and Stamina Yes       Intervention Provide advice, education, support and counseling about physical activity/exercise needs.;Develop an individualized exercise prescription for aerobic and resistive training based on initial evaluation findings, risk stratification, comorbidities and participant's personal goals.       Expected Outcomes Short Term: Increase workloads from initial exercise prescription for resistance, speed, and METs.;Short Term: Perform resistance training exercises routinely during rehab and add in resistance training at home;Long Term: Improve cardiorespiratory fitness, muscular endurance and strength as  measured by increased METs and functional capacity ( )       Able to understand and use rate of perceived exertion (RPE) scale Yes       Intervention Provide education and explanation on how to use RPE scale       Expected Outcomes Short Term: Able to use RPE daily in rehab to express subjective intensity level;Long Term:  Able to use RPE to guide  intensity level when exercising independently       Able to understand and use Dyspnea scale Yes       Intervention Provide education and explanation on how to use Dyspnea scale       Expected Outcomes Short Term: Able to use Dyspnea scale daily in rehab to express subjective sense of shortness of breath during exertion;Long Term: Able to use Dyspnea scale to guide intensity level when exercising independently       Knowledge and understanding of Target Heart Rate Range (THRR) Yes       Intervention Provide education and explanation of THRR including how the numbers were predicted and where they are located for reference       Expected Outcomes Short Term: Able to use daily as guideline for intensity in rehab;Short Term: Able to state/look up THRR;Long Term: Able to use THRR to govern intensity when exercising independently       Able to check pulse independently Yes       Intervention Provide education and demonstration on how to check pulse in carotid and radial arteries.;Review the importance of being able to check your own pulse for safety during independent exercise       Expected Outcomes Short Term: Able to explain why pulse checking is important during independent exercise;Long Term: Able to check pulse independently and accurately       Understanding of Exercise Prescription Yes       Intervention Provide education, explanation, and written materials on patient's individual exercise prescription       Expected Outcomes Short Term: Able to explain program exercise prescription;Long Term: Able to explain home exercise prescription to exercise independently          Exercise Goals Re-Evaluation :   Discharge Exercise Prescription (Final Exercise Prescription Changes):  Exercise Prescription Changes - 09/17/23 1500       Response to Exercise   Blood Pressure (Admit) 122/70    Blood Pressure (Exercise) 138/64    Blood Pressure (Exit) 116/66    Heart Rate (Admit) 62 bpm    Heart Rate  (Exercise) 75 bpm    Heart Rate (Exit) 61 bpm    Oxygen Saturation (Admit) 97 %    Oxygen Saturation (Exercise) 100 %    Oxygen Saturation (Exit) 100 %    Rating of Perceived Exertion (Exercise) 13    Perceived Dyspnea (Exercise) 2.5    Symptoms SOB    Comments results          Nutrition:  Target Goals: Understanding of nutrition guidelines, daily intake of sodium 1500mg , cholesterol 200mg , calories 30% from fat and 7% or less from saturated fats, daily to have 5 or more servings of fruits and vegetables.  Education: Nutrition 1 -Group instruction provided by verbal, written material, interactive activities, discussions, models, and posters to present general guidelines for heart healthy nutrition including macronutrients, label reading, and promoting whole foods over processed counterparts. Education serves as Pensions consultant of discussion of heart healthy eating for all. Written material provided at class time.    Education:  Nutrition 2 -Group instruction provided by verbal, written material, interactive activities, discussions, models, and posters to present general guidelines for heart healthy nutrition including sodium, cholesterol, and saturated fat. Providing guidance of habit forming to improve blood pressure, cholesterol, and body weight. Written material provided at class time.     Biometrics:  Pre Biometrics - 09/17/23 1516       Pre Biometrics   Height 5' 10.1 (1.781 m)    Weight 208 lb 1.6 oz (94.4 kg)    Waist Circumference 43.5 inches    Hip Circumference 42.5 inches    Waist to Hip Ratio 1.02 %    BMI (Calculated) 29.76    Single Leg Stand 3.6 seconds           Nutrition Therapy Plan and Nutrition Goals:   Nutrition Assessments:  MEDIFICTS Score Key: >=70 Need to make dietary changes  40-70 Heart Healthy Diet <= 40 Therapeutic Level Cholesterol Diet  Flowsheet Row Cardiac Rehab from 09/17/2023 in [facility] Cardiac and Pulmonary Rehab  Picture Your  Plate Total Score on Admission 56   Picture Your Plate Scores: <59 Unhealthy dietary pattern with much room for improvement. 41-50 Dietary pattern unlikely to meet recommendations for good health and room for improvement. 51-60 More healthful dietary pattern, with some room for improvement.  >60 Healthy dietary pattern, although there may be some specific behaviors that could be improved.    Nutrition Goals Re-Evaluation:   Nutrition Goals Discharge (Final Nutrition Goals Re-Evaluation):   Psychosocial: Target Goals: Acknowledge presence or absence of significant depression and/or stress, maximize coping skills, provide positive support system. Participant is able to verbalize types and ability to use techniques and skills needed for reducing stress and depression.   Education: Stress, Anxiety, and Depression - Group verbal and visual presentation to define topics covered.  Reviews how body is impacted by stress, anxiety, and depression.  Also discusses healthy ways to reduce stress and to treat/manage anxiety and depression. Written material provided at class time.   Education: Sleep Hygiene -Provides group verbal and written instruction about how sleep can affect your health.  Define sleep hygiene, discuss sleep cycles and impact of sleep habits. Review good sleep hygiene tips.   Initial Review & Psychosocial Screening:  Initial Psych Review & Screening - 09/06/23 1015       Initial Review   Current issues with None Identified      Family Dynamics   Good Support System? Yes    Comments He has two daughters, a son and a girlfriend for support. He states no mental instability.      Barriers   Psychosocial barriers to participate in program There are no identifiable barriers or psychosocial needs.;The patient should benefit from training in stress management and relaxation.      Screening Interventions   Interventions Encouraged to exercise;To provide support and resources  with identified psychosocial needs;Provide feedback about the scores to participant    Expected Outcomes Short Term goal: Utilizing psychosocial counselor, staff and physician to assist with identification of specific Stressors or current issues interfering with healing process. Setting desired goal for each stressor or current issue identified.;Long Term Goal: Stressors or current issues are controlled or eliminated.;Short Term goal: Identification and review with participant of any Quality of Life or Depression concerns found by scoring the questionnaire.;Long Term goal: The participant improves quality of Life and PHQ9 Scores as seen by post scores and/or verbalization of changes          Quality  of Life Scores:   Quality of Life - 09/17/23 1521       Quality of Life   Select Quality of Life      Quality of Life Scores   Health/Function Pre 11 %    Socioeconomic Pre 24.75 %    Psych/Spiritual Pre 13.71 %    Family Pre 26.4 %    GLOBAL Pre 16.89 %         Scores of 19 and below usually indicate a poorer quality of life in these areas.  A difference of  2-3 points is a clinically meaningful difference.  A difference of 2-3 points in the total score of the Quality of Life Index has been associated with significant improvement in overall quality of life, self-image, physical symptoms, and general health in studies assessing change in quality of life.  PHQ-9: Review Flowsheet        No data to display         Interpretation of Total Score  Total Score Depression Severity:  1-4 = Minimal depression, 5-9 = Mild depression, 10-14 = Moderate depression, 15-19 = Moderately severe depression, 20-27 = Severe depression   Psychosocial Evaluation and Intervention:  Psychosocial Evaluation - 09/06/23 1017       Psychosocial Evaluation & Interventions   Interventions Encouraged to exercise with the program and follow exercise prescription;Relaxation education;Stress management  education    Comments He has two daughters, a son and a girlfriend for support. He states no mental instability.    Expected Outcomes Short: Start Heart[facility] to help with mood. Long: Maintain a healthy mental state    Continue Psychosocial Services  Follow up required by staff          Psychosocial Re-Evaluation:   Psychosocial Discharge (Final Psychosocial Re-Evaluation):   Vocational Rehabilitation: Provide vocational rehab assistance to qualifying candidates.   Vocational Rehab Evaluation & Intervention:   Education: Education Goals: Education classes will be provided on a variety of topics geared toward better understanding of heart health and risk factor modification. Participant will state understanding/return demonstration of topics presented as noted by education test scores.  Learning Barriers/Preferences:  Learning Barriers/Preferences - 09/06/23 1014       Learning Barriers/Preferences   Learning Barriers None    Learning Preferences None          General Cardiac Education Topics:  AED/CPR: - Group verbal and written instruction with the use of models to demonstrate the basic use of the AED with the basic ABC's of resuscitation.   Test and Procedures: - Group verbal and visual presentation and models provide information about basic cardiac anatomy and function. Reviews the testing methods done to diagnose heart disease and the outcomes of the test results. Describes the treatment choices: Medical Management, Angioplasty, or Coronary Bypass Surgery for treating various heart conditions including Myocardial Infarction, Angina, Valve Disease, and Cardiac Arrhythmias. Written material provided at class time.   Medication Safety: - Group verbal and visual instruction to review commonly prescribed medications for heart and lung disease. Reviews the medication, class of the drug, and side effects. Includes the steps to properly store meds and maintain the  prescription regimen. Written material provided at class time.   Intimacy: - Group verbal instruction through game format to discuss how heart and lung disease can affect sexual intimacy. Written material provided at class time.   Know Your Numbers and Heart Failure: - Group verbal and visual instruction to discuss disease risk factors for cardiac and pulmonary disease  and treatment options.  Reviews associated critical values for Overweight/Obesity, Hypertension, Cholesterol, and Diabetes.  Discusses basics of heart failure: signs/symptoms and treatments.  Introduces Heart Failure Zone chart for action plan for heart failure. Written material provided at class time.   Infection Prevention: - Provides verbal and written material to individual with discussion of infection control including proper hand washing and proper equipment cleaning during exercise session. Flowsheet Row Cardiac Rehab from 09/17/2023 in [facility] Cardiac and Pulmonary Rehab  Date 09/17/23  Educator [facility]  Instruction Review Code 1- Verbalizes Understanding    Falls Prevention: - Provides verbal and written material to individual with discussion of falls prevention and safety. Flowsheet Row Cardiac Rehab from 09/17/2023 in [facility] Cardiac and Pulmonary Rehab  Date 09/17/23  Educator [facility]  Instruction Review Code 1- Verbalizes Understanding    Other: -Provides group and verbal instruction on various topics (see comments)   Knowledge Questionnaire Score:  Knowledge Questionnaire Score - 09/17/23 1523       Knowledge Questionnaire Score   Pre Score 26          Core Components/Risk Factors/Patient Goals at Admission:  Personal Goals and Risk Factors at Admission - 09/06/23 1014       Core Components/Risk Factors/Patient Goals on Admission    Weight Management Yes;Weight Loss    Intervention Weight Management: Develop a combined nutrition and exercise program designed to reach desired caloric intake, while maintaining  appropriate intake of nutrient and fiber, sodium and fats, and appropriate energy expenditure required for the weight goal.;Weight Management: Provide education and appropriate resources to help participant work on and attain dietary goals.;Weight Management/Obesity: Establish reasonable short term and long term weight goals.    Expected Outcomes Short Term: Continue to assess and modify interventions until short term weight is achieved;Long Term: Adherence to nutrition and physical activity/exercise program aimed toward attainment of established weight goal;Weight Loss: Understanding of general recommendations for a balanced deficit meal plan, which promotes 1-2 lb weight loss per week and includes a negative energy balance of [phone] kcal/d;Understanding recommendations for meals to include 15-35% energy as protein, 25-35% energy from fat, 35-60% energy from carbohydrates, less than 200mg  of dietary cholesterol, 20-35 gm of total fiber daily;Understanding of distribution of calorie intake throughout the day with the consumption of 4-5 meals/snacks    Hypertension Yes    Intervention Provide education on lifestyle modifcations including regular physical activity/exercise, weight management, moderate sodium restriction and increased consumption of fresh fruit, vegetables, and low fat dairy, alcohol  moderation, and smoking cessation.;Monitor prescription use compliance.    Expected Outcomes Short Term: Continued assessment and intervention until BP is < 140/44mm HG in hypertensive participants. < 130/70mm HG in hypertensive participants with diabetes, heart failure or chronic kidney disease.;Long Term: Maintenance of blood pressure at goal levels.    Lipids Yes    Intervention Provide education and support for participant on nutrition & aerobic/resistive exercise along with prescribed medications to achieve LDL 70mg , HDL >40mg .    Expected Outcomes Short Term: Participant states understanding of desired  cholesterol values and is compliant with medications prescribed. Participant is following exercise prescription and nutrition guidelines.;Long Term: Cholesterol controlled with medications as prescribed, with individualized exercise RX and with personalized nutrition plan. Value goals: LDL < 70mg , HDL > 40 mg.          Education:Diabetes - Individual verbal and written instruction to review signs/symptoms of diabetes, desired ranges of glucose level fasting, after meals and with exercise. Acknowledge that pre and post exercise  glucose checks will be done for 3 sessions at entry of program.   Core Components/Risk Factors/Patient Goals Review:    Core Components/Risk Factors/Patient Goals at Discharge (Final Review):    ITP Comments:  ITP Comments     Row Name 09/06/23 1012 09/17/23 1510         ITP Comments Virtual Visit completed. Patient informed on EP and RD appointment and 6 Minute walk test. Patient also informed of patient health questionnaires on My Chart. Patient Verbalizes understanding. Visit diagnosis can be found in [facility] 4//29/2025. Completed and gym orientation for cardiac rehab. Initial ITP created and sent for review to Dr. Oneil Pinal, Medical Director.         Comments: Initial ITP

## 2023-09-23 ENCOUNTER — Ambulatory Visit

## 2023-09-23 DIAGNOSIS — I214 Non-ST elevation (NSTEMI) myocardial infarction: Secondary | ICD-10-CM

## 2023-09-23 DIAGNOSIS — Z955 Presence of coronary angioplasty implant and graft: Secondary | ICD-10-CM

## 2023-09-23 DIAGNOSIS — I252 Old myocardial infarction: Secondary | ICD-10-CM | POA: Diagnosis not present

## 2023-09-23 NOTE — Progress Notes (Signed)
 Daily Session Note  Patient Details  Name: Theodore Wiggins MRN: [id number] Date of Birth: October 13, 1945 Referring Provider:   Flowsheet Row Cardiac Rehab from 09/17/2023 in [facility] Cardiac and Pulmonary Rehab  Referring Provider Dr. Keller Paterson    Encounter Date: 09/23/2023  Check In:  Session Check In - 09/23/23 1405       Check-In   Supervising physician immediately available to respond to emergencies See telemetry face sheet for immediately available ER MD    Location [facility]-Cardiac & Pulmonary Rehab    Staff Present Devaughn Jaeger, BS, Exercise Physiologist;Susanne Bice, RN, BSN, CCRP;Maxon Conetta BS, Exercise Physiologist;Lauren Konstance Happel RN,BSN    Virtual Visit No    Medication changes reported     No    Fall or balance concerns reported    No    Tobacco Cessation No Change    Warm-up and Cool-down Performed on first and last piece of equipment    Resistance Training Performed Yes    VAD Patient? No    PAD/SET Patient? No      Pain Assessment   Currently in Pain? No/denies             Social History   Tobacco Use  Smoking Status Never  Smokeless Tobacco Current   Types: Snuff    Goals Met:  Independence with exercise equipment Exercise tolerated well No report of concerns or symptoms today Strength training completed today  Goals Unmet:  Not Applicable  Comments: First full day of exercise!  Patient was oriented to gym and equipment including functions, settings, policies, and procedures.  Patient's individual exercise prescription and treatment plan were reviewed.  All starting workloads were established based on the results of the 6 minute walk test done at initial orientation visit.  The plan for exercise progression was also introduced and progression will be customized based on patient's performance and goals.    Dr. Oneil Pinal is Medical Director for [facility] Cardiac Rehabilitation.  Dr. Fuad Aleskerov is Medical Director for [facility] Pulmonary  Rehabilitation.

## 2023-09-25 ENCOUNTER — Ambulatory Visit

## 2023-09-25 DIAGNOSIS — I252 Old myocardial infarction: Secondary | ICD-10-CM | POA: Diagnosis not present

## 2023-09-25 DIAGNOSIS — Z955 Presence of coronary angioplasty implant and graft: Secondary | ICD-10-CM

## 2023-09-25 DIAGNOSIS — I214 Non-ST elevation (NSTEMI) myocardial infarction: Secondary | ICD-10-CM

## 2023-09-25 NOTE — Progress Notes (Signed)
 Daily Session Note  Patient Details  Name: Theodore Wiggins MRN: [id number] Date of Birth: Sep 06, 1945 Referring Provider:   Flowsheet Row Cardiac Rehab from 09/17/2023 in [facility] Cardiac and Pulmonary Rehab  Referring Provider Dr. Keller Paterson    Encounter Date: 09/25/2023  Check In:  Session Check In - 09/25/23 1358       Check-In   Supervising physician immediately available to respond to emergencies See telemetry face sheet for immediately available ER MD    Location [facility]-Cardiac & Pulmonary Rehab    Staff Present Burnard Davenport RN,BSN,MPA;Meredith Tressa RN,BSN;Laura Cates RN,BSN;Keymoni Mccaster Dyane BS, ACSM CEP, Exercise Physiologist;Jason Elnor RDN,LDN    Virtual Visit No    Medication changes reported     No    Fall or balance concerns reported    No    Tobacco Cessation No Change    Warm-up and Cool-down Performed on first and last piece of equipment    Resistance Training Performed Yes    VAD Patient? No    PAD/SET Patient? No      Pain Assessment   Currently in Pain? No/denies             Social History   Tobacco Use  Smoking Status Never  Smokeless Tobacco Current   Types: Snuff    Goals Met:  Independence with exercise equipment Exercise tolerated well No report of concerns or symptoms today Strength training completed today  Goals Unmet:  Not Applicable  Comments: Pt able to follow exercise prescription today without complaint.  Will continue to monitor for progression.    Dr. Oneil Pinal is Medical Director for [facility] Cardiac Rehabilitation.  Dr. Fuad Aleskerov is Medical Director for [facility] Pulmonary Rehabilitation.

## 2023-09-30 ENCOUNTER — Encounter: Admitting: Emergency Medicine

## 2023-09-30 DIAGNOSIS — I214 Non-ST elevation (NSTEMI) myocardial infarction: Secondary | ICD-10-CM

## 2023-09-30 DIAGNOSIS — I252 Old myocardial infarction: Secondary | ICD-10-CM | POA: Diagnosis not present

## 2023-09-30 DIAGNOSIS — Z955 Presence of coronary angioplasty implant and graft: Secondary | ICD-10-CM

## 2023-09-30 NOTE — Progress Notes (Signed)
 Daily Session Note  Patient Details  Name: Theodore Wiggins MRN: [id number] Date of Birth: 11/19/1945 Referring Provider:   Flowsheet Row Cardiac Rehab from 09/17/2023 in [facility] Cardiac and Pulmonary Rehab  Referring Provider Dr. Keller Paterson    Encounter Date: 09/30/2023  Check In:  Session Check In - 09/30/23 1355       Check-In   Supervising physician immediately available to respond to emergencies See telemetry face sheet for immediately available ER MD    Location [facility]-Cardiac & Pulmonary Rehab    Staff Present Rollene Paterson, MS, Exercise Physiologist;Noah Tickle, BS, Exercise Physiologist;Maxon Conetta BS, Exercise Physiologist;Natiya Seelinger RN,BSN    Virtual Visit No    Medication changes reported     No    Fall or balance concerns reported    No    Tobacco Cessation No Change    Warm-up and Cool-down Performed on first and last piece of equipment    Resistance Training Performed Yes    VAD Patient? No    PAD/SET Patient? No      Pain Assessment   Currently in Pain? No/denies             Social History   Tobacco Use  Smoking Status Never  Smokeless Tobacco Current   Types: Snuff    Goals Met:  Independence with exercise equipment Exercise tolerated well No report of concerns or symptoms today Strength training completed today  Goals Unmet:  Not Applicable  Comments: Pt able to follow exercise prescription today without complaint.  Will continue to monitor for progression.    Dr. Oneil Pinal is Medical Director for [facility] Cardiac Rehabilitation.  Dr. Fuad Aleskerov is Medical Director for [facility] Pulmonary Rehabilitation.

## 2023-10-02 ENCOUNTER — Encounter: Admitting: Emergency Medicine

## 2023-10-02 DIAGNOSIS — I214 Non-ST elevation (NSTEMI) myocardial infarction: Secondary | ICD-10-CM

## 2023-10-02 DIAGNOSIS — Z955 Presence of coronary angioplasty implant and graft: Secondary | ICD-10-CM

## 2023-10-02 DIAGNOSIS — I252 Old myocardial infarction: Secondary | ICD-10-CM | POA: Diagnosis not present

## 2023-10-02 NOTE — Progress Notes (Signed)
 Daily Session Note  Patient Details  Name: Theodore Wiggins MRN: [id number] Date of Birth: July 15, 1945 Referring Provider:   Flowsheet Row Cardiac Rehab from 09/17/2023 in [facility] Cardiac and Pulmonary Rehab  Referring Provider Dr. Keller Paterson    Encounter Date: 10/02/2023  Check In:  Session Check In - 10/02/23 1357       Check-In   Supervising physician immediately available to respond to emergencies See telemetry face sheet for immediately available ER MD    Location [facility]-Cardiac & Pulmonary Rehab    Staff Present Devaughn Jaeger, BS, Exercise Physiologist;Kelly Dyane HECKLE, ACSM CEP, Exercise Physiologist;Baruc Tugwell RN,BSN;Kelly Bollinger [facility]    Virtual Visit No    Medication changes reported     No    Fall or balance concerns reported    No    Tobacco Cessation No Change    Warm-up and Cool-down Performed on first and last piece of equipment    Resistance Training Performed Yes    VAD Patient? No    PAD/SET Patient? No      Pain Assessment   Currently in Pain? No/denies             Social History   Tobacco Use  Smoking Status Never  Smokeless Tobacco Current   Types: Snuff    Goals Met:  Independence with exercise equipment Exercise tolerated well No report of concerns or symptoms today Strength training completed today  Goals Unmet:  Not Applicable  Comments: Pt able to follow exercise prescription today without complaint.  Will continue to monitor for progression.    Dr. Oneil Pinal is Medical Director for [facility] Cardiac Rehabilitation.  Dr. Fuad Aleskerov is Medical Director for [facility] Pulmonary Rehabilitation.

## 2023-10-02 NOTE — Progress Notes (Signed)
 Cardiac Individual Treatment Plan  Patient Details  Name: Theodore Wiggins MRN: [id number] Date of Birth: 12/02/1945 Referring Provider:   Flowsheet Row Cardiac Rehab from 09/17/2023 in [facility] Cardiac and Pulmonary Rehab  Referring Provider Dr. Keller Paterson    Initial Encounter Date:  Flowsheet Row Cardiac Rehab from 09/17/2023 in [facility] Cardiac and Pulmonary Rehab  Date 09/17/23    Visit Diagnosis: NSTEMI (non-ST elevated myocardial infarction) [facility])  Status post coronary artery stent placement  Patient's Home Medications on Admission:  Current Outpatient Medications:    aspirin  81 MG chewable tablet, Chew 81 mg by mouth. (Patient not taking: Reported on 09/06/2023), Disp: , Rfl:    aspirin  81 MG chewable tablet, Chew 81 mg by mouth., Disp: , Rfl:    carvedilol (COREG) 6.25 MG tablet, Take 6.25 mg by mouth., Disp: , Rfl:    clopidogrel (PLAVIX) 75 MG tablet, Take 75 mg by mouth., Disp: , Rfl:    furosemide  (LASIX ) 20 MG tablet, Take 20 mg by mouth daily. (Patient not taking: Reported on 09/06/2023), Disp: , Rfl:    furosemide  (LASIX ) 40 MG tablet, Take 40 mg by mouth daily. (Patient not taking: Reported on 09/06/2023), Disp: , Rfl:    Multiple Vitamins-Minerals (CENTRUM SILVER 50+MEN PO), Take by mouth., Disp: , Rfl:    nitroGLYCERIN  (NITROSTAT ) 0.4 MG SL tablet, Place 0.4 mg under the tongue every 5 (five) minutes as needed for chest pain., Disp: , Rfl:    nitroGLYCERIN  (NITROSTAT ) 0.4 MG SL tablet, Place 0.4 mg under the tongue. (Patient not taking: Reported on 09/06/2023), Disp: , Rfl:    omeprazole (PRILOSEC) 20 MG capsule, Take 20 mg by mouth daily. (Patient not taking: Reported on 09/06/2023), Disp: , Rfl:    pantoprazole (PROTONIX) 40 MG tablet, Take 40 mg by mouth., Disp: , Rfl:    rosuvastatin (CRESTOR) 20 MG tablet, Take 1 tablet by mouth at bedtime., Disp: , Rfl:    sacubitril-valsartan (ENTRESTO) 24-26 MG, Take 1 tablet by mouth every 12 (twelve) hours., Disp: , Rfl:    spironolactone  (ALDACTONE) 25 MG tablet, Take 12.5 mg by mouth., Disp: , Rfl:    ticagrelor (BRILINTA) 90 MG TABS tablet, Take 90 mg by mouth 2 (two) times daily. (Patient not taking: Reported on 09/06/2023), Disp: , Rfl:   Past Medical History: Past Medical History:  Diagnosis Date   Arthritis    Coronary artery disease    DDD (degenerative disc disease), lumbar    DDD (degenerative disc disease), lumbar    Depression    Dyspnea    GERD (gastroesophageal reflux disease)    Hyperlipidemia    Hypertension    NSTEMI (non-ST elevated myocardial infarction) (HCC) 06/04/2023   Post herpetic neuralgia    Vertigo     Tobacco Use: Social History   Tobacco Use  Smoking Status Never  Smokeless Tobacco Current   Types: Snuff    Labs: Review Flowsheet       Latest Ref Rng & Units 10/01/2017 04/29/2020 06/04/2023  Labs for ITP Cardiac and Pulmonary Rehab  Cholestrol 0 - 200 mg/dL - - 885   LDL (calc) 0 - 99 mg/dL - - 63   HDL-C >59 mg/dL - - 34   Trlycerides <849 mg/dL - - 84   Hemoglobin J8r 4.8 - 5.6 % 5.5  - 5.5   O2 Saturation % - 97.1  -     Exercise Target Goals: Exercise Program Goal: Individual exercise prescription set using results from initial 6 min walk  test and THRR while considering  patient's activity barriers and safety.   Exercise Prescription Goal: Initial exercise prescription builds to 30-45 minutes a day of aerobic activity, 2-3 days per week.  Home exercise guidelines will be given to patient during program as part of exercise prescription that the participant will acknowledge.   Education: Aerobic Exercise: - Group verbal and visual presentation on the components of exercise prescription. Introduces F.I.T.T principle from ACSM for exercise prescriptions.  Reviews F.I.T.T. principles of aerobic exercise including progression. Written material provided at class time. Flowsheet Row Cardiac Rehab from 09/17/2023 in [facility] Cardiac and Pulmonary Rehab  Education need identified  09/17/23    Education: Resistance Exercise: - Group verbal and visual presentation on the components of exercise prescription. Introduces F.I.T.T principle from ACSM for exercise prescriptions  Reviews F.I.T.T. principles of resistance exercise including progression. Written material provided at class time.    Education: Exercise & Equipment Safety: - Individual verbal instruction and demonstration of equipment use and safety with use of the equipment. Flowsheet Row Cardiac Rehab from 09/17/2023 in [facility] Cardiac and Pulmonary Rehab  Date 09/17/23  Educator [facility]  Instruction Review Code 1- Verbalizes Understanding    Education: Exercise Physiology & General Exercise Guidelines: - Group verbal and written instruction with models to review the exercise physiology of the cardiovascular system and associated critical values. Provides general exercise guidelines with specific guidelines to those with heart or lung disease. Written material provided at class time. Flowsheet Row Cardiac Rehab from 09/17/2023 in [facility] Cardiac and Pulmonary Rehab  Education need identified 09/17/23    Education: Flexibility, Balance, Mind/Body Relaxation: - Group verbal and visual presentation with interactive activity on the components of exercise prescription. Introduces F.I.T.T principle from ACSM for exercise prescriptions. Reviews F.I.T.T. principles of flexibility and balance exercise training including progression. Also discusses the mind body connection.  Reviews various relaxation techniques to help reduce and manage stress (i.e. Deep breathing, progressive muscle relaxation, and visualization). Balance handout provided to take home. Written material provided at class time.   Activity Barriers & Risk Stratification:   6 Minute Walk:  6 Minute Walk     Row Name 09/17/23 1511         6 Minute Walk   Phase Initial     Distance 920 feet     Walk Time 6 minutes     # of Rest Breaks 0     MPH 1.74      METS 1.5     RPE 13     Perceived Dyspnea  2.5     VO2 Peak 5.25     Symptoms Yes (comment)     Comments SOB     Resting HR 62 bpm     Resting BP 122/70     Resting Oxygen Saturation  97 %     Exercise Oxygen Saturation  during 6 min walk 100 %     Max Ex. HR 75 bpm     Max Ex. BP 138/64     2 Minute Post BP 116/66        Oxygen Initial Assessment:   Oxygen Re-Evaluation:   Oxygen Discharge (Final Oxygen Re-Evaluation):   Initial Exercise Prescription:  Initial Exercise Prescription - 09/17/23 1500       Date of Initial Exercise RX and Referring Provider   Date 09/17/23    Referring Provider Dr. Keller Paterson      Oxygen   Maintain Oxygen Saturation 88% or higher  Treadmill   MPH 1.7    Grade 0    Minutes 15    METs 2.3      Recumbant Bike   Level 2    RPM 50    Watts 25    Minutes 15    METs 1.5      T5 Nustep   Level 2    SPM 80    Minutes 15    METs 1.5      Track   Laps 24    Minutes 15    METs 2.31      Prescription Details   Duration Progress to 30 minutes of continuous aerobic without signs/symptoms of physical distress      Intensity   THRR 40-80% of Max Heartrate 94-126    Ratings of Perceived Exertion 11-13    Perceived Dyspnea 0-4      Progression   Progression Continue to progress workloads to maintain intensity without signs/symptoms of physical distress.      Resistance Training   Training Prescription Yes    Weight 4lb    Reps 10-15          Perform Capillary Blood Glucose checks as needed.  Exercise Prescription Changes:   Exercise Prescription Changes     Row Name 09/17/23 1500             Response to Exercise   Blood Pressure (Admit) 122/70       Blood Pressure (Exercise) 138/64       Blood Pressure (Exit) 116/66       Heart Rate (Admit) 62 bpm       Heart Rate (Exercise) 75 bpm       Heart Rate (Exit) 61 bpm       Oxygen Saturation (Admit) 97 %       Oxygen Saturation (Exercise) 100 %        Oxygen Saturation (Exit) 100 %       Rating of Perceived Exertion (Exercise) 13       Perceived Dyspnea (Exercise) 2.5       Symptoms SOB       Comments results          Exercise Comments:   Exercise Comments     Row Name 09/23/23 1406           Exercise Comments First full day of exercise!  Patient was oriented to gym and equipment including functions, settings, policies, and procedures.  Patient's individual exercise prescription and treatment plan were reviewed.  All starting workloads were established based on the results of the 6 minute walk test done at initial orientation visit.  The plan for exercise progression was also introduced and progression will be customized based on patient's performance and goals.          Exercise Goals and Review:   Exercise Goals     Row Name 09/17/23 1515             Exercise Goals   Increase Physical Activity Yes       Intervention Provide advice, education, support and counseling about physical activity/exercise needs.;Develop an individualized exercise prescription for aerobic and resistive training based on initial evaluation findings, risk stratification, comorbidities and participant's personal goals.       Expected Outcomes Short Term: Attend rehab on a regular basis to increase amount of physical activity.;Long Term: Add in home exercise to make exercise part of routine and to increase amount of physical activity.;Long Term: Exercising  regularly at least 3-5 days a week.       Increase Strength and Stamina Yes       Intervention Provide advice, education, support and counseling about physical activity/exercise needs.;Develop an individualized exercise prescription for aerobic and resistive training based on initial evaluation findings, risk stratification, comorbidities and participant's personal goals.       Expected Outcomes Short Term: Increase workloads from initial exercise prescription for resistance, speed, and  METs.;Short Term: Perform resistance training exercises routinely during rehab and add in resistance training at home;Long Term: Improve cardiorespiratory fitness, muscular endurance and strength as measured by increased METs and functional capacity ( )       Able to understand and use rate of perceived exertion (RPE) scale Yes       Intervention Provide education and explanation on how to use RPE scale       Expected Outcomes Short Term: Able to use RPE daily in rehab to express subjective intensity level;Long Term:  Able to use RPE to guide intensity level when exercising independently       Able to understand and use Dyspnea scale Yes       Intervention Provide education and explanation on how to use Dyspnea scale       Expected Outcomes Short Term: Able to use Dyspnea scale daily in rehab to express subjective sense of shortness of breath during exertion;Long Term: Able to use Dyspnea scale to guide intensity level when exercising independently       Knowledge and understanding of Target Heart Rate Range (THRR) Yes       Intervention Provide education and explanation of THRR including how the numbers were predicted and where they are located for reference       Expected Outcomes Short Term: Able to use daily as guideline for intensity in rehab;Short Term: Able to state/look up THRR;Long Term: Able to use THRR to govern intensity when exercising independently       Able to check pulse independently Yes       Intervention Provide education and demonstration on how to check pulse in carotid and radial arteries.;Review the importance of being able to check your own pulse for safety during independent exercise       Expected Outcomes Short Term: Able to explain why pulse checking is important during independent exercise;Long Term: Able to check pulse independently and accurately       Understanding of Exercise Prescription Yes       Intervention Provide education, explanation, and written materials  on patient's individual exercise prescription       Expected Outcomes Short Term: Able to explain program exercise prescription;Long Term: Able to explain home exercise prescription to exercise independently          Exercise Goals Re-Evaluation :  Exercise Goals Re-Evaluation     Row Name 09/23/23 1407             Exercise Goal Re-Evaluation   Exercise Goals Review Increase Physical Activity;Able to understand and use rate of perceived exertion (RPE) scale;Knowledge and understanding of Target Heart Rate Range (THRR);Understanding of Exercise Prescription;Increase Strength and Stamina;Able to understand and use Dyspnea scale;Able to check pulse independently       Comments Reviewed RPE and dyspnea scale, THR and program prescription with pt today.  Pt voiced understanding and was given a copy of goals to take home.       Expected Outcomes Short: Use RPE daily to regulate intensity.  Long: Follow program prescription in  THR.          Discharge Exercise Prescription (Final Exercise Prescription Changes):  Exercise Prescription Changes - 09/17/23 1500       Response to Exercise   Blood Pressure (Admit) 122/70    Blood Pressure (Exercise) 138/64    Blood Pressure (Exit) 116/66    Heart Rate (Admit) 62 bpm    Heart Rate (Exercise) 75 bpm    Heart Rate (Exit) 61 bpm    Oxygen Saturation (Admit) 97 %    Oxygen Saturation (Exercise) 100 %    Oxygen Saturation (Exit) 100 %    Rating of Perceived Exertion (Exercise) 13    Perceived Dyspnea (Exercise) 2.5    Symptoms SOB    Comments results          Nutrition:  Target Goals: Understanding of nutrition guidelines, daily intake of sodium 1500mg , cholesterol 200mg , calories 30% from fat and 7% or less from saturated fats, daily to have 5 or more servings of fruits and vegetables.  Education: Nutrition 1 -Group instruction provided by verbal, written material, interactive activities, discussions, models, and posters to  present general guidelines for heart healthy nutrition including macronutrients, label reading, and promoting whole foods over processed counterparts. Education serves as Pensions consultant of discussion of heart healthy eating for all. Written material provided at class time.    Education: Nutrition 2 -Group instruction provided by verbal, written material, interactive activities, discussions, models, and posters to present general guidelines for heart healthy nutrition including sodium, cholesterol, and saturated fat. Providing guidance of habit forming to improve blood pressure, cholesterol, and body weight. Written material provided at class time.     Biometrics:  Pre Biometrics - 09/17/23 1516       Pre Biometrics   Height 5' 10.1 (1.781 m)    Weight 208 lb 1.6 oz (94.4 kg)    Waist Circumference 43.5 inches    Hip Circumference 42.5 inches    Waist to Hip Ratio 1.02 %    BMI (Calculated) 29.76    Single Leg Stand 3.6 seconds           Nutrition Therapy Plan and Nutrition Goals:   Nutrition Assessments:  MEDIFICTS Score Key: >=70 Need to make dietary changes  40-70 Heart Healthy Diet <= 40 Therapeutic Level Cholesterol Diet  Flowsheet Row Cardiac Rehab from 09/17/2023 in [facility] Cardiac and Pulmonary Rehab  Picture Your Plate Total Score on Admission 56   Picture Your Plate Scores: <59 Unhealthy dietary pattern with much room for improvement. 41-50 Dietary pattern unlikely to meet recommendations for good health and room for improvement. 51-60 More healthful dietary pattern, with some room for improvement.  >60 Healthy dietary pattern, although there may be some specific behaviors that could be improved.    Nutrition Goals Re-Evaluation:   Nutrition Goals Discharge (Final Nutrition Goals Re-Evaluation):   Psychosocial: Target Goals: Acknowledge presence or absence of significant depression and/or stress, maximize coping skills, provide positive support system.  Participant is able to verbalize types and ability to use techniques and skills needed for reducing stress and depression.   Education: Stress, Anxiety, and Depression - Group verbal and visual presentation to define topics covered.  Reviews how body is impacted by stress, anxiety, and depression.  Also discusses healthy ways to reduce stress and to treat/manage anxiety and depression. Written material provided at class time.   Education: Sleep Hygiene -Provides group verbal and written instruction about how sleep can affect your health.  Define sleep hygiene, discuss sleep  cycles and impact of sleep habits. Review good sleep hygiene tips.   Initial Review & Psychosocial Screening:  Initial Psych Review & Screening - 09/06/23 1015       Initial Review   Current issues with None Identified      Family Dynamics   Good Support System? Yes    Comments He has two daughters, a son and a girlfriend for support. He states no mental instability.      Barriers   Psychosocial barriers to participate in program There are no identifiable barriers or psychosocial needs.;The patient should benefit from training in stress management and relaxation.      Screening Interventions   Interventions Encouraged to exercise;To provide support and resources with identified psychosocial needs;Provide feedback about the scores to participant    Expected Outcomes Short Term goal: Utilizing psychosocial counselor, staff and physician to assist with identification of specific Stressors or current issues interfering with healing process. Setting desired goal for each stressor or current issue identified.;Long Term Goal: Stressors or current issues are controlled or eliminated.;Short Term goal: Identification and review with participant of any Quality of Life or Depression concerns found by scoring the questionnaire.;Long Term goal: The participant improves quality of Life and PHQ9 Scores as seen by post scores and/or  verbalization of changes          Quality of Life Scores:   Quality of Life - 09/17/23 1521       Quality of Life   Select Quality of Life      Quality of Life Scores   Health/Function Pre 11 %    Socioeconomic Pre 24.75 %    Psych/Spiritual Pre 13.71 %    Family Pre 26.4 %    GLOBAL Pre 16.89 %         Scores of 19 and below usually indicate a poorer quality of life in these areas.  A difference of  2-3 points is a clinically meaningful difference.  A difference of 2-3 points in the total score of the Quality of Life Index has been associated with significant improvement in overall quality of life, self-image, physical symptoms, and general health in studies assessing change in quality of life.  PHQ-9: Review Flowsheet       09/17/2023  Depression screen PHQ 2/9  Decreased Interest 1  Down, Depressed, Hopeless 1  PHQ - 2 Score 2  Altered sleeping 1  Tired, decreased energy 3  Change in appetite 1  Feeling bad or failure about yourself  2  Trouble concentrating 0  Moving slowly or fidgety/restless 0  Suicidal thoughts 0  PHQ-9 Score 9  Difficult doing work/chores Very difficult   Interpretation of Total Score  Total Score Depression Severity:  1-4 = Minimal depression, 5-9 = Mild depression, 10-14 = Moderate depression, 15-19 = Moderately severe depression, 20-27 = Severe depression   Psychosocial Evaluation and Intervention:  Psychosocial Evaluation - 09/06/23 1017       Psychosocial Evaluation & Interventions   Interventions Encouraged to exercise with the program and follow exercise prescription;Relaxation education;Stress management education    Comments He has two daughters, a son and a girlfriend for support. He states no mental instability.    Expected Outcomes Short: Start [facility] to help with mood. Long: Maintain a healthy mental state    Continue Psychosocial Services  Follow up required by staff          Psychosocial  Re-Evaluation:   Psychosocial Discharge (Final Psychosocial Re-Evaluation):   Vocational Rehabilitation:  Provide vocational rehab assistance to qualifying candidates.   Vocational Rehab Evaluation & Intervention:   Education: Education Goals: Education classes will be provided on a variety of topics geared toward better understanding of heart health and risk factor modification. Participant will state understanding/return demonstration of topics presented as noted by education test scores.  Learning Barriers/Preferences:  Learning Barriers/Preferences - 09/06/23 1014       Learning Barriers/Preferences   Learning Barriers None    Learning Preferences None          General Cardiac Education Topics:  AED/CPR: - Group verbal and written instruction with the use of models to demonstrate the basic use of the AED with the basic ABC's of resuscitation.   Test and Procedures: - Group verbal and visual presentation and models provide information about basic cardiac anatomy and function. Reviews the testing methods done to diagnose heart disease and the outcomes of the test results. Describes the treatment choices: Medical Management, Angioplasty, or Coronary Bypass Surgery for treating various heart conditions including Myocardial Infarction, Angina, Valve Disease, and Cardiac Arrhythmias. Written material provided at class time.   Medication Safety: - Group verbal and visual instruction to review commonly prescribed medications for heart and lung disease. Reviews the medication, class of the drug, and side effects. Includes the steps to properly store meds and maintain the prescription regimen. Written material provided at class time.   Intimacy: - Group verbal instruction through game format to discuss how heart and lung disease can affect sexual intimacy. Written material provided at class time.   Know Your Numbers and Heart Failure: - Group verbal and visual instruction to  discuss disease risk factors for cardiac and pulmonary disease and treatment options.  Reviews associated critical values for Overweight/Obesity, Hypertension, Cholesterol, and Diabetes.  Discusses basics of heart failure: signs/symptoms and treatments.  Introduces Heart Failure Zone chart for action plan for heart failure. Written material provided at class time.   Infection Prevention: - Provides verbal and written material to individual with discussion of infection control including proper hand washing and proper equipment cleaning during exercise session. Flowsheet Row Cardiac Rehab from 09/17/2023 in [facility] Cardiac and Pulmonary Rehab  Date 09/17/23  Educator [facility]  Instruction Review Code 1- Verbalizes Understanding    Falls Prevention: - Provides verbal and written material to individual with discussion of falls prevention and safety. Flowsheet Row Cardiac Rehab from 09/17/2023 in [facility] Cardiac and Pulmonary Rehab  Date 09/17/23  Educator [facility]  Instruction Review Code 1- Verbalizes Understanding    Other: -Provides group and verbal instruction on various topics (see comments)   Knowledge Questionnaire Score:  Knowledge Questionnaire Score - 09/17/23 1523       Knowledge Questionnaire Score   Pre Score 26          Core Components/Risk Factors/Patient Goals at Admission:  Personal Goals and Risk Factors at Admission - 09/06/23 1014       Core Components/Risk Factors/Patient Goals on Admission    Weight Management Yes;Weight Loss    Intervention Weight Management: Develop a combined nutrition and exercise program designed to reach desired caloric intake, while maintaining appropriate intake of nutrient and fiber, sodium and fats, and appropriate energy expenditure required for the weight goal.;Weight Management: Provide education and appropriate resources to help participant work on and attain dietary goals.;Weight Management/Obesity: Establish reasonable short term and long term  weight goals.    Expected Outcomes Short Term: Continue to assess and modify interventions until short term weight is achieved;Long Term: Adherence  to nutrition and physical activity/exercise program aimed toward attainment of established weight goal;Weight Loss: Understanding of general recommendations for a balanced deficit meal plan, which promotes 1-2 lb weight loss per week and includes a negative energy balance of [phone] kcal/d;Understanding recommendations for meals to include 15-35% energy as protein, 25-35% energy from fat, 35-60% energy from carbohydrates, less than 200mg  of dietary cholesterol, 20-35 gm of total fiber daily;Understanding of distribution of calorie intake throughout the day with the consumption of 4-5 meals/snacks    Hypertension Yes    Intervention Provide education on lifestyle modifcations including regular physical activity/exercise, weight management, moderate sodium restriction and increased consumption of fresh fruit, vegetables, and low fat dairy, alcohol  moderation, and smoking cessation.;Monitor prescription use compliance.    Expected Outcomes Short Term: Continued assessment and intervention until BP is < 140/63mm HG in hypertensive participants. < 130/34mm HG in hypertensive participants with diabetes, heart failure or chronic kidney disease.;Long Term: Maintenance of blood pressure at goal levels.    Lipids Yes    Intervention Provide education and support for participant on nutrition & aerobic/resistive exercise along with prescribed medications to achieve LDL 70mg , HDL >40mg .    Expected Outcomes Short Term: Participant states understanding of desired cholesterol values and is compliant with medications prescribed. Participant is following exercise prescription and nutrition guidelines.;Long Term: Cholesterol controlled with medications as prescribed, with individualized exercise RX and with personalized nutrition plan. Value goals: LDL < 70mg , HDL > 40 mg.           Education:Diabetes - Individual verbal and written instruction to review signs/symptoms of diabetes, desired ranges of glucose level fasting, after meals and with exercise. Acknowledge that pre and post exercise glucose checks will be done for 3 sessions at entry of program.   Core Components/Risk Factors/Patient Goals Review:    Core Components/Risk Factors/Patient Goals at Discharge (Final Review):    ITP Comments:  ITP Comments     Row Name 09/06/23 1012 09/17/23 1510 09/23/23 1406 10/02/23 0955     ITP Comments Virtual Visit completed. Patient informed on EP and RD appointment and 6 Minute walk test. Patient also informed of patient health questionnaires on My Chart. Patient Verbalizes understanding. Visit diagnosis can be found in [facility] 4//29/2025. Completed and gym orientation for cardiac rehab. Initial ITP created and sent for review to Dr. Oneil Pinal, Medical Director. First full day of exercise!  Patient was oriented to gym and equipment including functions, settings, policies, and procedures.  Patient's individual exercise prescription and treatment plan were reviewed.  All starting workloads were established based on the results of the 6 minute walk test done at initial orientation visit.  The plan for exercise progression was also introduced and progression will be customized based on patient's performance and goals. 30 Day review completed. Medical Director ITP review done; changes made as directed and signed approval by Medical Director. New to program.       Comments: 30 day review

## 2023-10-09 ENCOUNTER — Encounter

## 2023-10-14 ENCOUNTER — Encounter: Attending: Cardiology | Admitting: Emergency Medicine

## 2023-10-14 DIAGNOSIS — Z955 Presence of coronary angioplasty implant and graft: Secondary | ICD-10-CM | POA: Diagnosis present

## 2023-10-14 DIAGNOSIS — I214 Non-ST elevation (NSTEMI) myocardial infarction: Secondary | ICD-10-CM | POA: Diagnosis present

## 2023-10-14 NOTE — Progress Notes (Signed)
 Daily Session Note  Patient Details  Name: TED GOODNER MRN: [id number] Date of Birth: Mar 14, 1945 Referring Provider:   Flowsheet Row Cardiac Rehab from 09/17/2023 in [facility] Cardiac and Pulmonary Rehab  Referring Provider Dr. Keller Paterson    Encounter Date: 10/14/2023  Check In:  Session Check In - 10/14/23 1409       Check-In   Supervising physician immediately available to respond to emergencies See telemetry face sheet for immediately available ER MD    Location [facility]-Cardiac & Pulmonary Rehab    Staff Present Devaughn Jaeger, BS, Exercise Physiologist;Margaret Best, MS, Exercise Physiologist;Maxon Conetta BS, Exercise Physiologist;Jakwon Gayton RN,BSN    Virtual Visit No    Medication changes reported     No    Fall or balance concerns reported    No    Tobacco Cessation No Change    Warm-up and Cool-down Performed on first and last piece of equipment    Resistance Training Performed Yes    VAD Patient? No    PAD/SET Patient? No      Pain Assessment   Currently in Pain? No/denies             Social History   Tobacco Use  Smoking Status Never  Smokeless Tobacco Current   Types: Snuff    Goals Met:  Independence with exercise equipment Exercise tolerated well No report of concerns or symptoms today Strength training completed today  Goals Unmet:  Not Applicable  Comments: Pt able to follow exercise prescription today without complaint.  Will continue to monitor for progression.    Dr. Oneil Pinal is Medical Director for [facility] Cardiac Rehabilitation.  Dr. Fuad Aleskerov is Medical Director for [facility] Pulmonary Rehabilitation.

## 2023-10-16 ENCOUNTER — Encounter

## 2023-10-16 DIAGNOSIS — I214 Non-ST elevation (NSTEMI) myocardial infarction: Secondary | ICD-10-CM

## 2023-10-16 DIAGNOSIS — Z955 Presence of coronary angioplasty implant and graft: Secondary | ICD-10-CM

## 2023-10-16 NOTE — Progress Notes (Signed)
 Daily Session Note  Patient Details  Name: Theodore Wiggins MRN: [id number] Date of Birth: 09-24-1945 Referring Provider:   Flowsheet Row Cardiac Rehab from 09/17/2023 in [facility] Cardiac and Pulmonary Rehab  Referring Provider Dr. Keller Paterson    Encounter Date: 10/16/2023  Check In:  Session Check In - 10/16/23 1401       Check-In   Supervising physician immediately available to respond to emergencies See telemetry face sheet for immediately available ER MD    Location [facility]-Cardiac & Pulmonary Rehab    Staff Present Burnard Davenport RN,BSN,MPA;Laura Cates RN,BSN;Quaneshia Wareing Dyane BS, ACSM CEP, Exercise Physiologist;Noah Tickle, BS, Exercise Physiologist    Virtual Visit No    Medication changes reported     No    Fall or balance concerns reported    No    Tobacco Cessation No Change    Warm-up and Cool-down Performed on first and last piece of equipment    Resistance Training Performed Yes    VAD Patient? No    PAD/SET Patient? No      Pain Assessment   Currently in Pain? No/denies             Social History   Tobacco Use  Smoking Status Never  Smokeless Tobacco Current   Types: Snuff    Goals Met:  Independence with exercise equipment Exercise tolerated well No report of concerns or symptoms today Strength training completed today  Goals Unmet:  Not Applicable  Comments: Pt able to follow exercise prescription today without complaint.  Will continue to monitor for progression.    Dr. Oneil Pinal is Medical Director for [facility] Cardiac Rehabilitation.  Dr. Fuad Aleskerov is Medical Director for [facility] Pulmonary Rehabilitation.

## 2023-10-21 ENCOUNTER — Encounter: Admitting: Emergency Medicine

## 2023-10-21 DIAGNOSIS — Z955 Presence of coronary angioplasty implant and graft: Secondary | ICD-10-CM

## 2023-10-21 DIAGNOSIS — I214 Non-ST elevation (NSTEMI) myocardial infarction: Secondary | ICD-10-CM | POA: Diagnosis not present

## 2023-10-21 NOTE — Progress Notes (Signed)
 Daily Session Note  Patient Details  Name: Theodore Wiggins MRN: [id number] Date of Birth: 08/12/1945 Referring Provider:   Flowsheet Row Cardiac Rehab from 09/17/2023 in [facility] Cardiac and Pulmonary Rehab  Referring Provider Dr. Keller Paterson    Encounter Date: 10/21/2023  Check In:  Session Check In - 10/21/23 1346       Check-In   Supervising physician immediately available to respond to emergencies See telemetry face sheet for immediately available ER MD    Location [facility]-Cardiac & Pulmonary Rehab    Staff Present Rollene Paterson, MS, Exercise Physiologist;Noah Tickle, BS, Exercise Physiologist;Dorann Davidson RN,BSN;Meredith Tressa RN,BSN    Virtual Visit No    Medication changes reported     No    Fall or balance concerns reported    No    Tobacco Cessation No Change    Warm-up and Cool-down Performed on first and last piece of equipment    Resistance Training Performed Yes    VAD Patient? No    PAD/SET Patient? No      Pain Assessment   Currently in Pain? No/denies             Social History   Tobacco Use  Smoking Status Never  Smokeless Tobacco Current   Types: Snuff    Goals Met:  Independence with exercise equipment Exercise tolerated well No report of concerns or symptoms today Strength training completed today  Goals Unmet:  Not Applicable  Comments: Pt able to follow exercise prescription today without complaint.  Will continue to monitor for progression.    Dr. Oneil Pinal is Medical Director for [facility] Cardiac Rehabilitation.  Dr. Fuad Aleskerov is Medical Director for [facility] Pulmonary Rehabilitation.

## 2023-10-23 ENCOUNTER — Encounter

## 2023-10-28 ENCOUNTER — Encounter: Admitting: *Deleted

## 2023-10-28 DIAGNOSIS — Z955 Presence of coronary angioplasty implant and graft: Secondary | ICD-10-CM

## 2023-10-28 DIAGNOSIS — I214 Non-ST elevation (NSTEMI) myocardial infarction: Secondary | ICD-10-CM

## 2023-10-28 NOTE — Progress Notes (Signed)
 Daily Session Note  Patient Details  Name: Theodore Wiggins MRN: [id number] Date of Birth: 01/10/1946 Referring Provider:   Flowsheet Row Cardiac Rehab from 09/17/2023 in [facility] Cardiac and Pulmonary Rehab  Referring Provider Dr. Keller Paterson    Encounter Date: 10/28/2023  Check In:  Session Check In - 10/28/23 1355       Check-In   Supervising physician immediately available to respond to emergencies See telemetry face sheet for immediately available ER MD    Location [facility]-Cardiac & Pulmonary Rehab    Staff Present Hoy Rodney RN,BSN;Noah Tickle, BS, Exercise Physiologist;Kelly Dyane HECKLE, ACSM CEP, Exercise Physiologist;Margaret Best, MS, Exercise Physiologist    Virtual Visit No    Medication changes reported     No    Fall or balance concerns reported    No    Warm-up and Cool-down Performed on first and last piece of equipment    Resistance Training Performed Yes    VAD Patient? No    PAD/SET Patient? No      Pain Assessment   Currently in Pain? No/denies             Social History   Tobacco Use  Smoking Status Never  Smokeless Tobacco Current   Types: Snuff    Goals Met:  Independence with exercise equipment Exercise tolerated well No report of concerns or symptoms today Strength training completed today  Goals Unmet:  Not Applicable  Comments: Pt able to follow exercise prescription today without complaint.  Will continue to monitor for progression.    Dr. Oneil Pinal is Medical Director for [facility] Cardiac Rehabilitation.  Dr. Fuad Aleskerov is Medical Director for [facility] Pulmonary Rehabilitation.

## 2023-10-30 ENCOUNTER — Ambulatory Visit

## 2023-10-30 DIAGNOSIS — I214 Non-ST elevation (NSTEMI) myocardial infarction: Secondary | ICD-10-CM

## 2023-10-30 DIAGNOSIS — Z955 Presence of coronary angioplasty implant and graft: Secondary | ICD-10-CM

## 2023-10-30 NOTE — Progress Notes (Signed)
 Cardiac Individual Treatment Plan  Patient Details  Name: Theodore Wiggins MRN: [id number] Date of Birth: [birth year] Referring Provider:   Flowsheet Row Cardiac Rehab from 09/17/2023 in [facility] Cardiac and Pulmonary Rehab  Referring Provider Dr. Keller Paterson    Initial Encounter Date:  Flowsheet Row Cardiac Rehab from 09/17/2023 in [facility] Cardiac and Pulmonary Rehab  Date 09/17/23    Visit Diagnosis: NSTEMI (non-ST elevated myocardial infarction) [facility])  Status post coronary artery stent placement  Patient's Home Medications on Admission:  Current Outpatient Medications:    aspirin  81 MG chewable tablet, Chew 81 mg by mouth. (Patient not taking: Reported on 09/06/2023), Disp: , Rfl:    aspirin  81 MG chewable tablet, Chew 81 mg by mouth., Disp: , Rfl:    carvedilol (COREG) 6.25 MG tablet, Take 6.25 mg by mouth., Disp: , Rfl:    clopidogrel (PLAVIX) 75 MG tablet, Take 75 mg by mouth., Disp: , Rfl:    furosemide  (LASIX ) 20 MG tablet, Take 20 mg by mouth daily. (Patient not taking: Reported on 09/06/2023), Disp: , Rfl:    furosemide  (LASIX ) 40 MG tablet, Take 40 mg by mouth daily. (Patient not taking: Reported on 09/06/2023), Disp: , Rfl:    Multiple Vitamins-Minerals (CENTRUM SILVER 50+MEN PO), Take by mouth., Disp: , Rfl:    nitroGLYCERIN  (NITROSTAT ) 0.4 MG SL tablet, Place 0.4 mg under the tongue every 5 (five) minutes as needed for chest pain., Disp: , Rfl:    nitroGLYCERIN  (NITROSTAT ) 0.4 MG SL tablet, Place 0.4 mg under the tongue. (Patient not taking: Reported on 09/06/2023), Disp: , Rfl:    omeprazole (PRILOSEC) 20 MG capsule, Take 20 mg by mouth daily. (Patient not taking: Reported on 09/06/2023), Disp: , Rfl:    pantoprazole (PROTONIX) 40 MG tablet, Take 40 mg by mouth., Disp: , Rfl:    rosuvastatin (CRESTOR) 20 MG tablet, Take 1 tablet by mouth at bedtime., Disp: , Rfl:    sacubitril-valsartan (ENTRESTO) 24-26 MG, Take 1 tablet by mouth every 12 (twelve) hours., Disp: , Rfl:    spironolactone  (ALDACTONE) 25 MG tablet, Take 12.5 mg by mouth., Disp: , Rfl:    ticagrelor (BRILINTA) 90 MG TABS tablet, Take 90 mg by mouth 2 (two) times daily. (Patient not taking: Reported on 09/06/2023), Disp: , Rfl:   Past Medical History: Past Medical History:  Diagnosis Date   Arthritis    Coronary artery disease    DDD (degenerative disc disease), lumbar    DDD (degenerative disc disease), lumbar    Depression    Dyspnea    GERD (gastroesophageal reflux disease)    Hyperlipidemia    Hypertension    NSTEMI (non-ST elevated myocardial infarction) (HCC) 06/04/2023   Post herpetic neuralgia    Vertigo     Tobacco Use: Social History   Tobacco Use  Smoking Status Never  Smokeless Tobacco Current   Types: Snuff    Labs: Review Flowsheet       Latest Ref Rng & Units 10/01/2017 04/29/2020 06/04/2023  Labs for ITP Cardiac and Pulmonary Rehab  Cholestrol 0 - 200 mg/dL - - 885   LDL (calc) 0 - 99 mg/dL - - 63   HDL-C >59 mg/dL - - 34   Trlycerides <849 mg/dL - - 84   Hemoglobin J8r 4.8 - 5.6 % 5.5  - 5.5   O2 Saturation % - 97.1  -     Exercise Target Goals: Exercise Program Goal: Individual exercise prescription set using results from initial 6 min walk  test and THRR while considering  patient's activity barriers and safety.   Exercise Prescription Goal: Initial exercise prescription builds to 30-45 minutes a day of aerobic activity, 2-3 days per week.  Home exercise guidelines will be given to patient during program as part of exercise prescription that the participant will acknowledge.   Education: Aerobic Exercise: - Group verbal and visual presentation on the components of exercise prescription. Introduces F.I.T.T principle from ACSM for exercise prescriptions.  Reviews F.I.T.T. principles of aerobic exercise including progression. Written material provided at class time. Flowsheet Row Cardiac Rehab from 10/16/2023 in [facility] Cardiac and Pulmonary Rehab  Education need identified  09/17/23    Education: Resistance Exercise: - Group verbal and visual presentation on the components of exercise prescription. Introduces F.I.T.T principle from ACSM for exercise prescriptions  Reviews F.I.T.T. principles of resistance exercise including progression. Written material provided at class time.    Education: Exercise & Equipment Safety: - Individual verbal instruction and demonstration of equipment use and safety with use of the equipment. Flowsheet Row Cardiac Rehab from 10/16/2023 in [facility] Cardiac and Pulmonary Rehab  Date 09/17/23  Educator [facility]  Instruction Review Code 1- Verbalizes Understanding    Education: Exercise Physiology & General Exercise Guidelines: - Group verbal and written instruction with models to review the exercise physiology of the cardiovascular system and associated critical values. Provides general exercise guidelines with specific guidelines to those with heart or lung disease. Written material provided at class time. Flowsheet Row Cardiac Rehab from 10/16/2023 in [facility] Cardiac and Pulmonary Rehab  Education need identified 09/17/23    Education: Flexibility, Balance, Mind/Body Relaxation: - Group verbal and visual presentation with interactive activity on the components of exercise prescription. Introduces F.I.T.T principle from ACSM for exercise prescriptions. Reviews F.I.T.T. principles of flexibility and balance exercise training including progression. Also discusses the mind body connection.  Reviews various relaxation techniques to help reduce and manage stress (i.e. Deep breathing, progressive muscle relaxation, and visualization). Balance handout provided to take home. Written material provided at class time.   Activity Barriers & Risk Stratification:   6 Minute Walk:  6 Minute Walk     Row Name 09/17/23 1511         6 Minute Walk   Phase Initial     Distance 920 feet     Walk Time 6 minutes     # of Rest Breaks 0     MPH 1.74      METS 1.5     RPE 13     Perceived Dyspnea  2.5     VO2 Peak 5.25     Symptoms Yes (comment)     Comments SOB     Resting HR 62 bpm     Resting BP 122/70     Resting Oxygen Saturation  97 %     Exercise Oxygen Saturation  during 6 min walk 100 %     Max Ex. HR 75 bpm     Max Ex. BP 138/64     2 Minute Post BP 116/66        Oxygen Initial Assessment:   Oxygen Re-Evaluation:   Oxygen Discharge (Final Oxygen Re-Evaluation):   Initial Exercise Prescription:  Initial Exercise Prescription - 09/17/23 1500       Date of Initial Exercise RX and Referring Provider   Date 09/17/23    Referring Provider Dr. Keller Paterson      Oxygen   Maintain Oxygen Saturation 88% or higher  Treadmill   MPH 1.7    Grade 0    Minutes 15    METs 2.3      Recumbant Bike   Level 2    RPM 50    Watts 25    Minutes 15    METs 1.5      T5 Nustep   Level 2    SPM 80    Minutes 15    METs 1.5      Track   Laps 24    Minutes 15    METs 2.31      Prescription Details   Duration Progress to 30 minutes of continuous aerobic without signs/symptoms of physical distress      Intensity   THRR 40-80% of Max Heartrate 94-126    Ratings of Perceived Exertion 11-13    Perceived Dyspnea 0-4      Progression   Progression Continue to progress workloads to maintain intensity without signs/symptoms of physical distress.      Resistance Training   Training Prescription Yes    Weight 4lb    Reps 10-15          Perform Capillary Blood Glucose checks as needed.  Exercise Prescription Changes:   Exercise Prescription Changes     Row Name 09/17/23 1500 10/09/23 1500 10/24/23 1300         Response to Exercise   Blood Pressure (Admit) 122/70 142/80 124/82     Blood Pressure (Exercise) 138/64 146/86 120/80     Blood Pressure (Exit) 116/66 120/64 110/62     Heart Rate (Admit) 62 bpm 67 bpm 77 bpm     Heart Rate (Exercise) 75 bpm 95 bpm 90 bpm     Heart Rate (Exit) 61 bpm 76 bpm  74 bpm     Oxygen Saturation (Admit) 97 % -- --     Oxygen Saturation (Exercise) 100 % -- --     Oxygen Saturation (Exit) 100 % -- --     Rating of Perceived Exertion (Exercise) 13 13 15      Perceived Dyspnea (Exercise) 2.5 -- --     Symptoms SOB none none     Comments results 1st 2 weeks of exercise --     Duration -- Progress to 30 minutes of  aerobic without signs/symptoms of physical distress Continue with 30 min of aerobic exercise without signs/symptoms of physical distress.     Intensity -- THRR unchanged THRR unchanged       Progression   Progression -- Continue to progress workloads to maintain intensity without signs/symptoms of physical distress. Continue to progress workloads to maintain intensity without signs/symptoms of physical distress.     Average METs -- 2.24 2.25       Resistance Training   Training Prescription -- Yes Yes     Weight -- 4lb 4 lb     Reps -- 10-15 10-15       Interval Training   Interval Training -- No No       Oxygen   Oxygen -- Continuous --       Treadmill   MPH -- 2 1.7     Grade -- 0 0     Minutes -- 15 15     METs -- 2.53 2.3       Recumbant Bike   Level -- 3 --     Watts -- 25 --     Minutes -- 15 --     METs -- 2.83 --  T5 Nustep   Level -- 5 5     Minutes -- 15 15     METs -- 2 2.2       Oxygen   Maintain Oxygen Saturation -- 88% or higher 88% or higher        Exercise Comments:   Exercise Comments     Row Name 09/23/23 1406           Exercise Comments First full day of exercise!  Patient was oriented to gym and equipment including functions, settings, policies, and procedures.  Patient's individual exercise prescription and treatment plan were reviewed.  All starting workloads were established based on the results of the 6 minute walk test done at initial orientation visit.  The plan for exercise progression was also introduced and progression will be customized based on patient's performance and goals.           Exercise Goals and Review:   Exercise Goals     Row Name 09/17/23 1515             Exercise Goals   Increase Physical Activity Yes       Intervention Provide advice, education, support and counseling about physical activity/exercise needs.;Develop an individualized exercise prescription for aerobic and resistive training based on initial evaluation findings, risk stratification, comorbidities and participant's personal goals.       Expected Outcomes Short Term: Attend rehab on a regular basis to increase amount of physical activity.;Long Term: Add in home exercise to make exercise part of routine and to increase amount of physical activity.;Long Term: Exercising regularly at least 3-5 days a week.       Increase Strength and Stamina Yes       Intervention Provide advice, education, support and counseling about physical activity/exercise needs.;Develop an individualized exercise prescription for aerobic and resistive training based on initial evaluation findings, risk stratification, comorbidities and participant's personal goals.       Expected Outcomes Short Term: Increase workloads from initial exercise prescription for resistance, speed, and METs.;Short Term: Perform resistance training exercises routinely during rehab and add in resistance training at home;Long Term: Improve cardiorespiratory fitness, muscular endurance and strength as measured by increased METs and functional capacity ( )       Able to understand and use rate of perceived exertion (RPE) scale Yes       Intervention Provide education and explanation on how to use RPE scale       Expected Outcomes Short Term: Able to use RPE daily in rehab to express subjective intensity level;Long Term:  Able to use RPE to guide intensity level when exercising independently       Able to understand and use Dyspnea scale Yes       Intervention Provide education and explanation on how to use Dyspnea scale       Expected Outcomes  Short Term: Able to use Dyspnea scale daily in rehab to express subjective sense of shortness of breath during exertion;Long Term: Able to use Dyspnea scale to guide intensity level when exercising independently       Knowledge and understanding of Target Heart Rate Range (THRR) Yes       Intervention Provide education and explanation of THRR including how the numbers were predicted and where they are located for reference       Expected Outcomes Short Term: Able to use daily as guideline for intensity in rehab;Short Term: Able to state/look up THRR;Long Term: Able to use THRR to govern intensity  when exercising independently       Able to check pulse independently Yes       Intervention Provide education and demonstration on how to check pulse in carotid and radial arteries.;Review the importance of being able to check your own pulse for safety during independent exercise       Expected Outcomes Short Term: Able to explain why pulse checking is important during independent exercise;Long Term: Able to check pulse independently and accurately       Understanding of Exercise Prescription Yes       Intervention Provide education, explanation, and written materials on patient's individual exercise prescription       Expected Outcomes Short Term: Able to explain program exercise prescription;Long Term: Able to explain home exercise prescription to exercise independently          Exercise Goals Re-Evaluation :  Exercise Goals Re-Evaluation     Row Name 09/23/23 1407 10/09/23 1538 10/24/23 1355         Exercise Goal Re-Evaluation   Exercise Goals Review Increase Physical Activity;Able to understand and use rate of perceived exertion (RPE) scale;Knowledge and understanding of Target Heart Rate Range (THRR);Understanding of Exercise Prescription;Increase Strength and Stamina;Able to understand and use Dyspnea scale;Able to check pulse independently Increase Physical Activity;Understanding of Exercise  Prescription;Increase Strength and Stamina Increase Physical Activity;Understanding of Exercise Prescription;Increase Strength and Stamina     Comments Reviewed RPE and dyspnea scale, THR and program prescription with pt today.  Pt voiced understanding and was given a copy of goals to take home. Kalib is off to a good start in the program and completed his first 2 weeks of exercise sessions in this review. He worked at level 5 on the T5 nustep and level 3 on the recumbent bike. He had a workload on the treadmill of a speed of 2 mph and no incline. We will continue to monitor his progress in the program. [name] only attended two sessions since the last review. He continues to walk the treadmill but lowered his speed from 2 mph to 1.7 mph with no incline. He also has consistently worked at level 5 on the T5 nustep. We will continue to monitor his progress in the program.     Expected Outcomes Short: Use RPE daily to regulate intensity.  Long: Follow program prescription in THR. Short: Continue to follow current exercise prescription. Long: Continue exercise to improve strength and stamina. Short: Attend rehab more consistently. Long: Continue exercise to improve strength and stamina.        Discharge Exercise Prescription (Final Exercise Prescription Changes):  Exercise Prescription Changes - 10/24/23 1300       Response to Exercise   Blood Pressure (Admit) 124/82    Blood Pressure (Exercise) 120/80    Blood Pressure (Exit) 110/62    Heart Rate (Admit) 77 bpm    Heart Rate (Exercise) 90 bpm    Heart Rate (Exit) 74 bpm    Rating of Perceived Exertion (Exercise) 15    Symptoms none    Duration Continue with 30 min of aerobic exercise without signs/symptoms of physical distress.    Intensity THRR unchanged      Progression   Progression Continue to progress workloads to maintain intensity without signs/symptoms of physical distress.    Average METs 2.25      Resistance Training   Training  Prescription Yes    Weight 4 lb    Reps 10-15      Interval Training   Interval  Training No      Treadmill   MPH 1.7    Grade 0    Minutes 15    METs 2.3      T5 Nustep   Level 5    Minutes 15    METs 2.2      Oxygen   Maintain Oxygen Saturation 88% or higher          Nutrition:  Target Goals: Understanding of nutrition guidelines, daily intake of sodium 1500mg , cholesterol 200mg , calories 30% from fat and 7% or less from saturated fats, daily to have 5 or more servings of fruits and vegetables.  Education: Nutrition 1 -Group instruction provided by verbal, written material, interactive activities, discussions, models, and posters to present general guidelines for heart healthy nutrition including macronutrients, label reading, and promoting whole foods over processed counterparts. Education serves as Pensions consultant of discussion of heart healthy eating for all. Written material provided at class time.    Education: Nutrition 2 -Group instruction provided by verbal, written material, interactive activities, discussions, models, and posters to present general guidelines for heart healthy nutrition including sodium, cholesterol, and saturated fat. Providing guidance of habit forming to improve blood pressure, cholesterol, and body weight. Written material provided at class time.     Biometrics:  Pre Biometrics - 09/17/23 1516       Pre Biometrics   Height 5' 10.1 (1.781 m)    Weight 208 lb 1.6 oz (94.4 kg)    Waist Circumference 43.5 inches    Hip Circumference 42.5 inches    Waist to Hip Ratio 1.02 %    BMI (Calculated) 29.76    Single Leg Stand 3.6 seconds           Nutrition Therapy Plan and Nutrition Goals:   Nutrition Assessments:  MEDIFICTS Score Key: >=70 Need to make dietary changes  40-70 Heart Healthy Diet <= 40 Therapeutic Level Cholesterol Diet  Flowsheet Row Cardiac Rehab from 09/17/2023 in [facility] Cardiac and Pulmonary Rehab  Picture Your  Plate Total Score on Admission 56   Picture Your Plate Scores: <59 Unhealthy dietary pattern with much room for improvement. 41-50 Dietary pattern unlikely to meet recommendations for good health and room for improvement. 51-60 More healthful dietary pattern, with some room for improvement.  >60 Healthy dietary pattern, although there may be some specific behaviors that could be improved.    Nutrition Goals Re-Evaluation:  Nutrition Goals Re-Evaluation     Row Name 10/28/23 1510             Goals   Nutrition Goal RD appointment deferred at this time          Nutrition Goals Discharge (Final Nutrition Goals Re-Evaluation):  Nutrition Goals Re-Evaluation - 10/28/23 1510       Goals   Nutrition Goal RD appointment deferred at this time          Psychosocial: Target Goals: Acknowledge presence or absence of significant depression and/or stress, maximize coping skills, provide positive support system. Participant is able to verbalize types and ability to use techniques and skills needed for reducing stress and depression.   Education: Stress, Anxiety, and Depression - Group verbal and visual presentation to define topics covered.  Reviews how body is impacted by stress, anxiety, and depression.  Also discusses healthy ways to reduce stress and to treat/manage anxiety and depression. Written material provided at class time.   Education: Sleep Hygiene -Provides group verbal and written instruction about how sleep can affect your health.  Define sleep hygiene, discuss sleep cycles and impact of sleep habits. Review good sleep hygiene tips.   Initial Review & Psychosocial Screening:  Initial Psych Review & Screening - 09/06/23 1015       Initial Review   Current issues with None Identified      Family Dynamics   Good Support System? Yes    Comments He has two daughters, a son and a girlfriend for support. He states no mental instability.      Barriers   Psychosocial  barriers to participate in program There are no identifiable barriers or psychosocial needs.;The patient should benefit from training in stress management and relaxation.      Screening Interventions   Interventions Encouraged to exercise;To provide support and resources with identified psychosocial needs;Provide feedback about the scores to participant    Expected Outcomes Short Term goal: Utilizing psychosocial counselor, staff and physician to assist with identification of specific Stressors or current issues interfering with healing process. Setting desired goal for each stressor or current issue identified.;Long Term Goal: Stressors or current issues are controlled or eliminated.;Short Term goal: Identification and review with participant of any Quality of Life or Depression concerns found by scoring the questionnaire.;Long Term goal: The participant improves quality of Life and PHQ9 Scores as seen by post scores and/or verbalization of changes          Quality of Life Scores:   Quality of Life - 09/17/23 1521       Quality of Life   Select Quality of Life      Quality of Life Scores   Health/Function Pre 11 %    Socioeconomic Pre 24.75 %    Psych/Spiritual Pre 13.71 %    Family Pre 26.4 %    GLOBAL Pre 16.89 %         Scores of 19 and below usually indicate a poorer quality of life in these areas.  A difference of  2-3 points is a clinically meaningful difference.  A difference of 2-3 points in the total score of the Quality of Life Index has been associated with significant improvement in overall quality of life, self-image, physical symptoms, and general health in studies assessing change in quality of life.  PHQ-9: Review Flowsheet       10/28/2023 09/17/2023  Depression screen PHQ 2/9  Decreased Interest 0 1  Down, Depressed, Hopeless 0 1  PHQ - 2 Score 0 2  Altered sleeping 1 1  Tired, decreased energy 2 3  Change in appetite 0 1  Feeling bad or failure about  yourself  0 2  Trouble concentrating 0 0  Moving slowly or fidgety/restless 0 0  Suicidal thoughts 0 0  PHQ-9 Score 3 9  Difficult doing work/chores Not difficult at all Very difficult   Interpretation of Total Score  Total Score Depression Severity:  1-4 = Minimal depression, 5-9 = Mild depression, 10-14 = Moderate depression, 15-19 = Moderately severe depression, 20-27 = Severe depression   Psychosocial Evaluation and Intervention:  Psychosocial Evaluation - 09/06/23 1017       Psychosocial Evaluation & Interventions   Interventions Encouraged to exercise with the program and follow exercise prescription;Relaxation education;Stress management education    Comments He has two daughters, a son and a girlfriend for support. He states no mental instability.    Expected Outcomes Short: Start Heart[facility] to help with mood. Long: Maintain a healthy mental state    Continue Psychosocial Services  Follow up required by staff  Psychosocial Re-Evaluation:  Psychosocial Re-Evaluation     Row Name 10/28/23 1507             Psychosocial Re-Evaluation   Current issues with None Identified       Comments [place] states he does not have any current stressors. He states he does not sleep great because he gets up to go to the bathroom 2-3 times a night, but does sleep soundly. He states he has a strong support system of his girlfriend, 2 daughters, and son that he can easily look to for support. He states he manages his stressors well and often will turn everything off and get out his bible and read. His PHQ score was reassessed today and it went down to 3.       Expected Outcomes Short: Continue to manage stressors that arise. Long: Continue to maintain a positive outlook on life.       Interventions Encouraged to attend Cardiac Rehabilitation for the exercise       Continue Psychosocial Services  Follow up required by staff          Psychosocial Discharge (Final Psychosocial  Re-Evaluation):  Psychosocial Re-Evaluation - 10/28/23 1507       Psychosocial Re-Evaluation   Current issues with None Identified    Comments [place] states he does not have any current stressors. He states he does not sleep great because he gets up to go to the bathroom 2-3 times a night, but does sleep soundly. He states he has a strong support system of his girlfriend, 2 daughters, and son that he can easily look to for support. He states he manages his stressors well and often will turn everything off and get out his bible and read. His PHQ score was reassessed today and it went down to 3.    Expected Outcomes Short: Continue to manage stressors that arise. Long: Continue to maintain a positive outlook on life.    Interventions Encouraged to attend Cardiac Rehabilitation for the exercise    Continue Psychosocial Services  Follow up required by staff          Vocational Rehabilitation: Provide vocational rehab assistance to qualifying candidates.   Vocational Rehab Evaluation & Intervention:   Education: Education Goals: Education classes will be provided on a variety of topics geared toward better understanding of heart health and risk factor modification. Participant will state understanding/return demonstration of topics presented as noted by education test scores.  Learning Barriers/Preferences:  Learning Barriers/Preferences - 09/06/23 1014       Learning Barriers/Preferences   Learning Barriers None    Learning Preferences None          General Cardiac Education Topics:  AED/CPR: - Group verbal and written instruction with the use of models to demonstrate the basic use of the AED with the basic ABC's of resuscitation.   Test and Procedures: - Group verbal and visual presentation and models provide information about basic cardiac anatomy and function. Reviews the testing methods done to diagnose heart disease and the outcomes of the test results. Describes the  treatment choices: Medical Management, Angioplasty, or Coronary Bypass Surgery for treating various heart conditions including Myocardial Infarction, Angina, Valve Disease, and Cardiac Arrhythmias. Written material provided at class time.   Medication Safety: - Group verbal and visual instruction to review commonly prescribed medications for heart and lung disease. Reviews the medication, class of the drug, and side effects. Includes the steps to properly store meds and maintain the prescription regimen.  Written material provided at class time.   Intimacy: - Group verbal instruction through game format to discuss how heart and lung disease can affect sexual intimacy. Written material provided at class time.   Know Your Numbers and Heart Failure: - Group verbal and visual instruction to discuss disease risk factors for cardiac and pulmonary disease and treatment options.  Reviews associated critical values for Overweight/Obesity, Hypertension, Cholesterol, and Diabetes.  Discusses basics of heart failure: signs/symptoms and treatments.  Introduces Heart Failure Zone chart for action plan for heart failure. Written material provided at class time. Flowsheet Row Cardiac Rehab from 10/16/2023 in [facility] Cardiac and Pulmonary Rehab  Date 10/16/23  Educator mc  Instruction Review Code 1- Verbalizes Understanding    Infection Prevention: - Provides verbal and written material to individual with discussion of infection control including proper hand washing and proper equipment cleaning during exercise session. Flowsheet Row Cardiac Rehab from 10/16/2023 in [facility] Cardiac and Pulmonary Rehab  Date 09/17/23  Educator [facility]  Instruction Review Code 1- Verbalizes Understanding    Falls Prevention: - Provides verbal and written material to individual with discussion of falls prevention and safety. Flowsheet Row Cardiac Rehab from 10/16/2023 in [facility] Cardiac and Pulmonary Rehab  Date 09/17/23  Educator [facility]   Instruction Review Code 1- Verbalizes Understanding    Other: -Provides group and verbal instruction on various topics (see comments)   Knowledge Questionnaire Score:  Knowledge Questionnaire Score - 09/17/23 1523       Knowledge Questionnaire Score   Pre Score 26          Core Components/Risk Factors/Patient Goals at Admission:  Personal Goals and Risk Factors at Admission - 09/06/23 1014       Core Components/Risk Factors/Patient Goals on Admission    Weight Management Yes;Weight Loss    Intervention Weight Management: Develop a combined nutrition and exercise program designed to reach desired caloric intake, while maintaining appropriate intake of nutrient and fiber, sodium and fats, and appropriate energy expenditure required for the weight goal.;Weight Management: Provide education and appropriate resources to help participant work on and attain dietary goals.;Weight Management/Obesity: Establish reasonable short term and long term weight goals.    Expected Outcomes Short Term: Continue to assess and modify interventions until short term weight is achieved;Long Term: Adherence to nutrition and physical activity/exercise program aimed toward attainment of established weight goal;Weight Loss: Understanding of general recommendations for a balanced deficit meal plan, which promotes 1-2 lb weight loss per week and includes a negative energy balance of [phone] kcal/d;Understanding recommendations for meals to include 15-35% energy as protein, 25-35% energy from fat, 35-60% energy from carbohydrates, less than 200mg  of dietary cholesterol, 20-35 gm of total fiber daily;Understanding of distribution of calorie intake throughout the day with the consumption of 4-5 meals/snacks    Hypertension Yes    Intervention Provide education on lifestyle modifcations including regular physical activity/exercise, weight management, moderate sodium restriction and increased consumption of fresh fruit,  vegetables, and low fat dairy, alcohol  moderation, and smoking cessation.;Monitor prescription use compliance.    Expected Outcomes Short Term: Continued assessment and intervention until BP is < 140/35mm HG in hypertensive participants. < 130/53mm HG in hypertensive participants with diabetes, heart failure or chronic kidney disease.;Long Term: Maintenance of blood pressure at goal levels.    Lipids Yes    Intervention Provide education and support for participant on nutrition & aerobic/resistive exercise along with prescribed medications to achieve LDL 70mg , HDL >40mg .    Expected Outcomes Short Term: Participant  states understanding of desired cholesterol values and is compliant with medications prescribed. Participant is following exercise prescription and nutrition guidelines.;Long Term: Cholesterol controlled with medications as prescribed, with individualized exercise RX and with personalized nutrition plan. Value goals: LDL < 70mg , HDL > 40 mg.          Education:Diabetes - Individual verbal and written instruction to review signs/symptoms of diabetes, desired ranges of glucose level fasting, after meals and with exercise. Acknowledge that pre and post exercise glucose checks will be done for 3 sessions at entry of program.   Core Components/Risk Factors/Patient Goals Review:   Goals and Risk Factor Review     Row Name 10/28/23 1510             Core Components/Risk Factors/Patient Goals Review   Personal Goals Review Weight Management/Obesity;Hypertension       Review Kaniel states he is monitoring his blood pressure at home with his wrist monitor and is getting similar readings as he does in rehab. He states he is checking his weight daily and has a goal to get down to 190lb. He knows it will take discipline and be hard. He has switched to drinking water and will occasionally have a soft drink. He also started to split meals with his girlfriend when they go out to help with  portion size.       Expected Outcomes Short: Cotinue monitoring weight and blood pressure at home. Long: Continue managing cardiovascular risk factors.          Core Components/Risk Factors/Patient Goals at Discharge (Final Review):   Goals and Risk Factor Review - 10/28/23 1510       Core Components/Risk Factors/Patient Goals Review   Personal Goals Review Weight Management/Obesity;Hypertension    Review Hristopher states he is monitoring his blood pressure at home with his wrist monitor and is getting similar readings as he does in rehab. He states he is checking his weight daily and has a goal to get down to 190lb. He knows it will take discipline and be hard. He has switched to drinking water and will occasionally have a soft drink. He also started to split meals with his girlfriend when they go out to help with portion size.    Expected Outcomes Short: Cotinue monitoring weight and blood pressure at home. Long: Continue managing cardiovascular risk factors.          ITP Comments:  ITP Comments     Row Name 09/06/23 1012 09/17/23 1510 09/23/23 1406 10/02/23 0955 10/30/23 1439   ITP Comments Virtual Visit completed. Patient informed on EP and RD appointment and 6 Minute walk test. Patient also informed of patient health questionnaires on My Chart. Patient Verbalizes understanding. Visit diagnosis can be found in [facility] 4//29/2025. Completed and gym orientation for cardiac rehab. Initial ITP created and sent for review to Dr. Oneil Pinal, Medical Director. First full day of exercise!  Patient was oriented to gym and equipment including functions, settings, policies, and procedures.  Patient's individual exercise prescription and treatment plan were reviewed.  All starting workloads were established based on the results of the 6 minute walk test done at initial orientation visit.  The plan for exercise progression was also introduced and progression will be customized based on patient's  performance and goals. 30 Day review completed. Medical Director ITP review done; changes made as directed and signed approval by Medical Director. New to program. 30 Day review completed. Medical Director ITP review done; changes made as directed  and signed approval by Medical Director.      Comments: 30 day review

## 2023-11-04 ENCOUNTER — Encounter

## 2023-11-06 ENCOUNTER — Encounter

## 2023-11-11 ENCOUNTER — Encounter

## 2023-11-13 ENCOUNTER — Encounter

## 2023-11-18 ENCOUNTER — Encounter: Attending: Cardiology | Admitting: Emergency Medicine

## 2023-11-18 DIAGNOSIS — Z955 Presence of coronary angioplasty implant and graft: Secondary | ICD-10-CM | POA: Diagnosis present

## 2023-11-18 DIAGNOSIS — I214 Non-ST elevation (NSTEMI) myocardial infarction: Secondary | ICD-10-CM | POA: Diagnosis present

## 2023-11-18 NOTE — Progress Notes (Signed)
 Daily Session Note  Patient Details  Name: Theodore Wiggins MRN: [id number] Date of Birth: [birth year] Referring Provider:   Flowsheet Row Cardiac Rehab from 09/17/2023 in [facility] Cardiac and Pulmonary Rehab  Referring Provider Dr. Keller Paterson    Encounter Date: 11/18/2023  Check In:  Session Check In - 11/18/23 1337       Check-In   Supervising physician immediately available to respond to emergencies See telemetry face sheet for immediately available ER MD    Location [facility]-Cardiac & Pulmonary Rehab    Staff Present Rollene Paterson, MS, Exercise Physiologist;Noah Tickle, BS, Exercise Physiologist;Hildred Pharo RN,BSN;Maxon Conetta BS, Exercise Physiologist    Virtual Visit No    Medication changes reported     No    Fall or balance concerns reported    Yes    Comments lost balance, denies injury    Tobacco Cessation No Change    Warm-up and Cool-down Performed on first and last piece of equipment    Resistance Training Performed Yes    VAD Patient? No    PAD/SET Patient? No      Pain Assessment   Currently in Pain? No/denies             Social History   Tobacco Use  Smoking Status Never  Smokeless Tobacco Current   Types: Snuff    Goals Met:  Independence with exercise equipment Exercise tolerated well No report of concerns or symptoms today Strength training completed today  Goals Unmet:  Not Applicable  Comments: Pt able to follow exercise prescription today without complaint.  Will continue to monitor for progression.    Dr. Oneil Pinal is Medical Director for [facility] Cardiac Rehabilitation.  Dr. Fuad Aleskerov is Medical Director for [facility] Pulmonary Rehabilitation.

## 2023-11-20 ENCOUNTER — Encounter

## 2023-11-20 DIAGNOSIS — I214 Non-ST elevation (NSTEMI) myocardial infarction: Secondary | ICD-10-CM

## 2023-11-20 DIAGNOSIS — Z955 Presence of coronary angioplasty implant and graft: Secondary | ICD-10-CM

## 2023-11-20 NOTE — Progress Notes (Signed)
 Daily Session Note  Patient Details  Name: ORBIE GRUPE MRN: [id number] Date of Birth: 09-07-1945 Referring Provider:   Flowsheet Row Cardiac Rehab from 09/17/2023 in [facility] Cardiac and Pulmonary Rehab  Referring Provider Dr. Keller Paterson    Encounter Date: 11/20/2023  Check In:  Session Check In - 11/20/23 1347       Check-In   Supervising physician immediately available to respond to emergencies See telemetry face sheet for immediately available ER MD    Location [facility]-Cardiac & Pulmonary Rehab    Staff Present Burnard Davenport RN,BSN,MPA;Meredith Tressa RN,BSN;Margaret Best, MS, Exercise Physiologist;Noah Tickle, BS, Exercise Physiologist    Virtual Visit No    Medication changes reported     No    Fall or balance concerns reported    No    Tobacco Cessation No Change    Warm-up and Cool-down Performed on first and last piece of equipment    Resistance Training Performed Yes    VAD Patient? No    PAD/SET Patient? No      Pain Assessment   Currently in Pain? No/denies             Social History   Tobacco Use  Smoking Status Never  Smokeless Tobacco Current   Types: Snuff    Goals Met:  Independence with exercise equipment Exercise tolerated well No report of concerns or symptoms today Strength training completed today  Goals Unmet:  Not Applicable  Comments: Pt able to follow exercise prescription today without complaint.  Will continue to monitor for progression.    Dr. Oneil Pinal is Medical Director for [facility] Cardiac Rehabilitation.  Dr. Fuad Aleskerov is Medical Director for [facility] Pulmonary Rehabilitation.

## 2023-11-25 ENCOUNTER — Encounter: Admitting: Emergency Medicine

## 2023-11-25 DIAGNOSIS — I214 Non-ST elevation (NSTEMI) myocardial infarction: Secondary | ICD-10-CM

## 2023-11-25 DIAGNOSIS — Z955 Presence of coronary angioplasty implant and graft: Secondary | ICD-10-CM

## 2023-11-25 NOTE — Progress Notes (Signed)
 Daily Session Note  Patient Details  Name: Theodore Wiggins MRN: [id number] Date of Birth: December 09, 1945 Referring Provider:   Flowsheet Row Cardiac Rehab from 09/17/2023 in [facility] Cardiac and Pulmonary Rehab  Referring Provider Dr. Keller Paterson    Encounter Date: 11/25/2023  Check In:  Session Check In - 11/25/23 1343       Check-In   Supervising physician immediately available to respond to emergencies See telemetry face sheet for immediately available ER MD    Location [facility]-Cardiac & Pulmonary Rehab    Staff Present Leita Franks RN,BSN;Maxon Conetta BS, Exercise Physiologist;Noah Tickle, BS, Exercise Physiologist;Margaret Best, MS, Exercise Physiologist    Virtual Visit No    Medication changes reported     No    Fall or balance concerns reported    No    Tobacco Cessation No Change    Warm-up and Cool-down Performed on first and last piece of equipment    Resistance Training Performed Yes    VAD Patient? No    PAD/SET Patient? No      Pain Assessment   Currently in Pain? No/denies             Social History   Tobacco Use  Smoking Status Never  Smokeless Tobacco Current   Types: Snuff    Goals Met:  Independence with exercise equipment Exercise tolerated well No report of concerns or symptoms today Strength training completed today  Goals Unmet:  Not Applicable  Comments: Pt able to follow exercise prescription today without complaint.  Will continue to monitor for progression.    Dr. Oneil Pinal is Medical Director for [facility] Cardiac Rehabilitation.  Dr. Fuad Aleskerov is Medical Director for [facility] Pulmonary Rehabilitation.

## 2023-11-27 ENCOUNTER — Encounter

## 2023-11-27 DIAGNOSIS — I214 Non-ST elevation (NSTEMI) myocardial infarction: Secondary | ICD-10-CM

## 2023-11-27 DIAGNOSIS — Z955 Presence of coronary angioplasty implant and graft: Secondary | ICD-10-CM

## 2023-11-27 NOTE — Progress Notes (Signed)
 Cardiac Individual Treatment Plan  Patient Details  Name: Theodore Wiggins MRN: [id number] Date of Birth: [birth year] Referring Provider:   Flowsheet Row Cardiac Rehab from 09/17/2023 in [facility] Cardiac and Pulmonary Rehab  Referring Provider Dr. Keller Paterson    Initial Encounter Date:  Flowsheet Row Cardiac Rehab from 09/17/2023 in [facility] Cardiac and Pulmonary Rehab  Date 09/17/23    Visit Diagnosis: NSTEMI (non-ST elevated myocardial infarction) [facility])  Status post coronary artery stent placement  Patient's Home Medications on Admission:  Current Outpatient Medications:    aspirin  81 MG chewable tablet, Chew 81 mg by mouth. (Patient not taking: Reported on 09/06/2023), Disp: , Rfl:    aspirin  81 MG chewable tablet, Chew 81 mg by mouth., Disp: , Rfl:    carvedilol (COREG) 6.25 MG tablet, Take 6.25 mg by mouth., Disp: , Rfl:    clopidogrel (PLAVIX) 75 MG tablet, Take 75 mg by mouth., Disp: , Rfl:    furosemide  (LASIX ) 20 MG tablet, Take 20 mg by mouth daily. (Patient not taking: Reported on 09/06/2023), Disp: , Rfl:    furosemide  (LASIX ) 40 MG tablet, Take 40 mg by mouth daily. (Patient not taking: Reported on 09/06/2023), Disp: , Rfl:    Multiple Vitamins-Minerals (CENTRUM SILVER 50+MEN PO), Take by mouth., Disp: , Rfl:    nitroGLYCERIN  (NITROSTAT ) 0.4 MG SL tablet, Place 0.4 mg under the tongue every 5 (five) minutes as needed for chest pain., Disp: , Rfl:    nitroGLYCERIN  (NITROSTAT ) 0.4 MG SL tablet, Place 0.4 mg under the tongue. (Patient not taking: Reported on 09/06/2023), Disp: , Rfl:    omeprazole (PRILOSEC) 20 MG capsule, Take 20 mg by mouth daily. (Patient not taking: Reported on 09/06/2023), Disp: , Rfl:    pantoprazole (PROTONIX) 40 MG tablet, Take 40 mg by mouth., Disp: , Rfl:    rosuvastatin (CRESTOR) 20 MG tablet, Take 1 tablet by mouth at bedtime., Disp: , Rfl:    sacubitril-valsartan (ENTRESTO) 24-26 MG, Take 1 tablet by mouth every 12 (twelve) hours., Disp: , Rfl:    spironolactone  (ALDACTONE) 25 MG tablet, Take 12.5 mg by mouth., Disp: , Rfl:    ticagrelor (BRILINTA) 90 MG TABS tablet, Take 90 mg by mouth 2 (two) times daily. (Patient not taking: Reported on 09/06/2023), Disp: , Rfl:   Past Medical History: Past Medical History:  Diagnosis Date   Arthritis    Coronary artery disease    DDD (degenerative disc disease), lumbar    DDD (degenerative disc disease), lumbar    Depression    Dyspnea    GERD (gastroesophageal reflux disease)    Hyperlipidemia    Hypertension    NSTEMI (non-ST elevated myocardial infarction) (HCC) 06/04/2023   Post herpetic neuralgia    Vertigo     Tobacco Use: Social History   Tobacco Use  Smoking Status Never  Smokeless Tobacco Current   Types: Snuff    Labs: Review Flowsheet       Latest Ref Rng & Units 10/01/2017 04/29/2020 06/04/2023  Labs for ITP Cardiac and Pulmonary Rehab  Cholestrol 0 - 200 mg/dL - - 885   LDL (calc) 0 - 99 mg/dL - - 63   HDL-C >59 mg/dL - - 34   Trlycerides <849 mg/dL - - 84   Hemoglobin J8r 4.8 - 5.6 % 5.5  - 5.5   O2 Saturation % - 97.1  -     Exercise Target Goals: Exercise Program Goal: Individual exercise prescription set using results from initial 6 min walk  test and THRR while considering  patient's activity barriers and safety.   Exercise Prescription Goal: Initial exercise prescription builds to 30-45 minutes a day of aerobic activity, 2-3 days per week.  Home exercise guidelines will be given to patient during program as part of exercise prescription that the participant will acknowledge.   Education: Aerobic Exercise: - Group verbal and visual presentation on the components of exercise prescription. Introduces F.I.T.T principle from ACSM for exercise prescriptions.  Reviews F.I.T.T. principles of aerobic exercise including progression. Written material provided at class time. Flowsheet Row Cardiac Rehab from 11/20/2023 in [facility] Cardiac and Pulmonary Rehab  Education need identified  09/17/23  Date 11/20/23  Educator mb  Instruction Review Code 1- Verbalizes Understanding    Education: Resistance Exercise: - Group verbal and visual presentation on the components of exercise prescription. Introduces F.I.T.T principle from ACSM for exercise prescriptions  Reviews F.I.T.T. principles of resistance exercise including progression. Written material provided at class time.    Education: Exercise & Equipment Safety: - Individual verbal instruction and demonstration of equipment use and safety with use of the equipment. Flowsheet Row Cardiac Rehab from 11/20/2023 in [facility] Cardiac and Pulmonary Rehab  Date 09/17/23  Educator [facility]  Instruction Review Code 1- Verbalizes Understanding    Education: Exercise Physiology & General Exercise Guidelines: - Group verbal and written instruction with models to review the exercise physiology of the cardiovascular system and associated critical values. Provides general exercise guidelines with specific guidelines to those with heart or lung disease. Written material provided at class time. Flowsheet Row Cardiac Rehab from 11/20/2023 in [facility] Cardiac and Pulmonary Rehab  Education need identified 09/17/23    Education: Flexibility, Balance, Mind/Body Relaxation: - Group verbal and visual presentation with interactive activity on the components of exercise prescription. Introduces F.I.T.T principle from ACSM for exercise prescriptions. Reviews F.I.T.T. principles of flexibility and balance exercise training including progression. Also discusses the mind body connection.  Reviews various relaxation techniques to help reduce and manage stress (i.e. Deep breathing, progressive muscle relaxation, and visualization). Balance handout provided to take home. Written material provided at class time.   Activity Barriers & Risk Stratification:   6 Minute Walk:  6 Minute Walk     Row Name 09/17/23 1511         6 Minute Walk   Phase Initial      Distance 920 feet     Walk Time 6 minutes     # of Rest Breaks 0     MPH 1.74     METS 1.5     RPE 13     Perceived Dyspnea  2.5     VO2 Peak 5.25     Symptoms Yes (comment)     Comments SOB     Resting HR 62 bpm     Resting BP 122/70     Resting Oxygen Saturation  97 %     Exercise Oxygen Saturation  during 6 min walk 100 %     Max Ex. HR 75 bpm     Max Ex. BP 138/64     2 Minute Post BP 116/66        Oxygen Initial Assessment:   Oxygen Re-Evaluation:   Oxygen Discharge (Final Oxygen Re-Evaluation):   Initial Exercise Prescription:  Initial Exercise Prescription - 09/17/23 1500       Date of Initial Exercise RX and Referring Provider   Date 09/17/23    Referring Provider Dr. Keller Paterson  Oxygen   Maintain Oxygen Saturation 88% or higher      Treadmill   MPH 1.7    Grade 0    Minutes 15    METs 2.3      Recumbant Bike   Level 2    RPM 50    Watts 25    Minutes 15    METs 1.5      T5 Nustep   Level 2    SPM 80    Minutes 15    METs 1.5      Track   Laps 24    Minutes 15    METs 2.31      Prescription Details   Duration Progress to 30 minutes of continuous aerobic without signs/symptoms of physical distress      Intensity   THRR 40-80% of Max Heartrate 94-126    Ratings of Perceived Exertion 11-13    Perceived Dyspnea 0-4      Progression   Progression Continue to progress workloads to maintain intensity without signs/symptoms of physical distress.      Resistance Training   Training Prescription Yes    Weight 4lb    Reps 10-15          Perform Capillary Blood Glucose checks as needed.  Exercise Prescription Changes:   Exercise Prescription Changes     Row Name 09/17/23 1500 10/09/23 1500 10/24/23 1300 11/06/23 0800       Response to Exercise   Blood Pressure (Admit) 122/70 142/80 124/82 130/84    Blood Pressure (Exercise) 138/64 146/86 120/80 134/78    Blood Pressure (Exit) 116/66 120/64 110/62 122/66    Heart Rate  (Admit) 62 bpm 67 bpm 77 bpm 76 bpm    Heart Rate (Exercise) 75 bpm 95 bpm 90 bpm 100 bpm    Heart Rate (Exit) 61 bpm 76 bpm 74 bpm 76 bpm    Oxygen Saturation (Admit) 97 % -- -- --    Oxygen Saturation (Exercise) 100 % -- -- --    Oxygen Saturation (Exit) 100 % -- -- --    Rating of Perceived Exertion (Exercise) 13 13 15 15     Perceived Dyspnea (Exercise) 2.5 -- -- --    Symptoms SOB none none none    Comments results 1st 2 weeks of exercise -- --    Duration -- Progress to 30 minutes of  aerobic without signs/symptoms of physical distress Continue with 30 min of aerobic exercise without signs/symptoms of physical distress. Continue with 30 min of aerobic exercise without signs/symptoms of physical distress.    Intensity -- THRR unchanged THRR unchanged THRR unchanged      Progression   Progression -- Continue to progress workloads to maintain intensity without signs/symptoms of physical distress. Continue to progress workloads to maintain intensity without signs/symptoms of physical distress. Continue to progress workloads to maintain intensity without signs/symptoms of physical distress.    Average METs -- 2.24 2.25 2.62      Resistance Training   Training Prescription -- Yes Yes Yes    Weight -- 4lb 4 lb 4 lb    Reps -- 10-15 10-15 10-15      Interval Training   Interval Training -- No No No      Oxygen   Oxygen -- Continuous -- --      Treadmill   MPH -- 2 1.7 2    Grade -- 0 0 0    Minutes -- 15 15 15  METs -- 2.53 2.3 2.53      Recumbant Bike   Level -- 3 -- --    Watts -- 25 -- --    Minutes -- 15 -- --    METs -- 2.83 -- --      T5 Nustep   Level -- 5 5 5     Minutes -- 15 15 15     METs -- 2 2.2 2.4      Biostep-RELP   Level -- -- -- 7    Minutes -- -- -- 15    METs -- -- -- 3      Oxygen   Maintain Oxygen Saturation -- 88% or higher 88% or higher 88% or higher       Exercise Comments:   Exercise Comments     Row Name 09/23/23 1406            Exercise Comments First full day of exercise!  Patient was oriented to gym and equipment including functions, settings, policies, and procedures.  Patient's individual exercise prescription and treatment plan were reviewed.  All starting workloads were established based on the results of the 6 minute walk test done at initial orientation visit.  The plan for exercise progression was also introduced and progression will be customized based on patient's performance and goals.          Exercise Goals and Review:   Exercise Goals     Row Name 09/17/23 1515             Exercise Goals   Increase Physical Activity Yes       Intervention Provide advice, education, support and counseling about physical activity/exercise needs.;Develop an individualized exercise prescription for aerobic and resistive training based on initial evaluation findings, risk stratification, comorbidities and participant's personal goals.       Expected Outcomes Short Term: Attend rehab on a regular basis to increase amount of physical activity.;Long Term: Add in home exercise to make exercise part of routine and to increase amount of physical activity.;Long Term: Exercising regularly at least 3-5 days a week.       Increase Strength and Stamina Yes       Intervention Provide advice, education, support and counseling about physical activity/exercise needs.;Develop an individualized exercise prescription for aerobic and resistive training based on initial evaluation findings, risk stratification, comorbidities and participant's personal goals.       Expected Outcomes Short Term: Increase workloads from initial exercise prescription for resistance, speed, and METs.;Short Term: Perform resistance training exercises routinely during rehab and add in resistance training at home;Long Term: Improve cardiorespiratory fitness, muscular endurance and strength as measured by increased METs and functional capacity ( )       Able to  understand and use rate of perceived exertion (RPE) scale Yes       Intervention Provide education and explanation on how to use RPE scale       Expected Outcomes Short Term: Able to use RPE daily in rehab to express subjective intensity level;Long Term:  Able to use RPE to guide intensity level when exercising independently       Able to understand and use Dyspnea scale Yes       Intervention Provide education and explanation on how to use Dyspnea scale       Expected Outcomes Short Term: Able to use Dyspnea scale daily in rehab to express subjective sense of shortness of breath during exertion;Long Term: Able to use Dyspnea scale to guide intensity level  when exercising independently       Knowledge and understanding of Target Heart Rate Range (THRR) Yes       Intervention Provide education and explanation of THRR including how the numbers were predicted and where they are located for reference       Expected Outcomes Short Term: Able to use daily as guideline for intensity in rehab;Short Term: Able to state/look up THRR;Long Term: Able to use THRR to govern intensity when exercising independently       Able to check pulse independently Yes       Intervention Provide education and demonstration on how to check pulse in carotid and radial arteries.;Review the importance of being able to check your own pulse for safety during independent exercise       Expected Outcomes Short Term: Able to explain why pulse checking is important during independent exercise;Long Term: Able to check pulse independently and accurately       Understanding of Exercise Prescription Yes       Intervention Provide education, explanation, and written materials on patient's individual exercise prescription       Expected Outcomes Short Term: Able to explain program exercise prescription;Long Term: Able to explain home exercise prescription to exercise independently          Exercise Goals Re-Evaluation :  Exercise Goals  Re-Evaluation     Row Name 09/23/23 1407 10/09/23 1538 10/24/23 1355 11/06/23 0825 11/21/23 1138     Exercise Goal Re-Evaluation   Exercise Goals Review Increase Physical Activity;Able to understand and use rate of perceived exertion (RPE) scale;Knowledge and understanding of Target Heart Rate Range (THRR);Understanding of Exercise Prescription;Increase Strength and Stamina;Able to understand and use Dyspnea scale;Able to check pulse independently Increase Physical Activity;Understanding of Exercise Prescription;Increase Strength and Stamina Increase Physical Activity;Understanding of Exercise Prescription;Increase Strength and Stamina Increase Physical Activity;Understanding of Exercise Prescription;Increase Strength and Stamina Increase Physical Activity;Understanding of Exercise Prescription;Increase Strength and Stamina   Comments Reviewed RPE and dyspnea scale, THR and program prescription with pt today.  Pt voiced understanding and was given a copy of goals to take home. Draylen is off to a good start in the program and completed his first 2 weeks of exercise sessions in this review. He worked at level 5 on the T5 nustep and level 3 on the recumbent bike. He had a workload on the treadmill of a speed of 2 mph and no incline. We will continue to monitor his progress in the program. [name] only attended two sessions since the last review. He continues to walk the treadmill but lowered his speed from 2 mph to 1.7 mph with no incline. He also has consistently worked at level 5 on the T5 nustep. We will continue to monitor his progress in the program. [name] only attended two sessions since the last review. He increased his treadmill workload back up to a speed of 2 mph with no incline. He also began using the biostep and did well at level 7. We will continue to monitor his progress in the program. Allex did not attend rehab during the last review period due to sickness. He has since returned to exercise  in the program. We will continue to monitor his progress in the program.   Expected Outcomes Short: Use RPE daily to regulate intensity.  Long: Follow program prescription in THR. Short: Continue to follow current exercise prescription. Long: Continue exercise to improve strength and stamina. Short: Attend rehab more consistently. Long: Continue exercise to improve strength  and stamina. Short: Attend rehab more consistently. Long: Continue exercise to improve strength and stamina. Short: Return to regular attendance in the program. Long: Continue exercise to improve strength and stamina.      Discharge Exercise Prescription (Final Exercise Prescription Changes):  Exercise Prescription Changes - 11/06/23 0800       Response to Exercise   Blood Pressure (Admit) 130/84    Blood Pressure (Exercise) 134/78    Blood Pressure (Exit) 122/66    Heart Rate (Admit) 76 bpm    Heart Rate (Exercise) 100 bpm    Heart Rate (Exit) 76 bpm    Rating of Perceived Exertion (Exercise) 15    Symptoms none    Duration Continue with 30 min of aerobic exercise without signs/symptoms of physical distress.    Intensity THRR unchanged      Progression   Progression Continue to progress workloads to maintain intensity without signs/symptoms of physical distress.    Average METs 2.62      Resistance Training   Training Prescription Yes    Weight 4 lb    Reps 10-15      Interval Training   Interval Training No      Treadmill   MPH 2    Grade 0    Minutes 15    METs 2.53      T5 Nustep   Level 5    Minutes 15    METs 2.4      Biostep-RELP   Level 7    Minutes 15    METs 3      Oxygen   Maintain Oxygen Saturation 88% or higher          Nutrition:  Target Goals: Understanding of nutrition guidelines, daily intake of sodium 1500mg , cholesterol 200mg , calories 30% from fat and 7% or less from saturated fats, daily to have 5 or more servings of fruits and vegetables.  Education: Nutrition  1 -Group instruction provided by verbal, written material, interactive activities, discussions, models, and posters to present general guidelines for heart healthy nutrition including macronutrients, label reading, and promoting whole foods over processed counterparts. Education serves as Pensions consultant of discussion of heart healthy eating for all. Written material provided at class time.    Education: Nutrition 2 -Group instruction provided by verbal, written material, interactive activities, discussions, models, and posters to present general guidelines for heart healthy nutrition including sodium, cholesterol, and saturated fat. Providing guidance of habit forming to improve blood pressure, cholesterol, and body weight. Written material provided at class time.     Biometrics:  Pre Biometrics - 09/17/23 1516       Pre Biometrics   Height 5' 10.1 (1.781 m)    Weight 208 lb 1.6 oz (94.4 kg)    Waist Circumference 43.5 inches    Hip Circumference 42.5 inches    Waist to Hip Ratio 1.02 %    BMI (Calculated) 29.76    Single Leg Stand 3.6 seconds           Nutrition Therapy Plan and Nutrition Goals:   Nutrition Assessments:  MEDIFICTS Score Key: >=70 Need to make dietary changes  40-70 Heart Healthy Diet <= 40 Therapeutic Level Cholesterol Diet  Flowsheet Row Cardiac Rehab from 09/17/2023 in [facility] Cardiac and Pulmonary Rehab  Picture Your Plate Total Score on Admission 56   Picture Your Plate Scores: <59 Unhealthy dietary pattern with much room for improvement. 41-50 Dietary pattern unlikely to meet recommendations for good health and room for improvement. 51-60  More healthful dietary pattern, with some room for improvement.  >60 Healthy dietary pattern, although there may be some specific behaviors that could be improved.    Nutrition Goals Re-Evaluation:  Nutrition Goals Re-Evaluation     Row Name 10/28/23 1510             Goals   Nutrition Goal RD appointment  deferred at this time          Nutrition Goals Discharge (Final Nutrition Goals Re-Evaluation):  Nutrition Goals Re-Evaluation - 10/28/23 1510       Goals   Nutrition Goal RD appointment deferred at this time          Psychosocial: Target Goals: Acknowledge presence or absence of significant depression and/or stress, maximize coping skills, provide positive support system. Participant is able to verbalize types and ability to use techniques and skills needed for reducing stress and depression.   Education: Stress, Anxiety, and Depression - Group verbal and visual presentation to define topics covered.  Reviews how body is impacted by stress, anxiety, and depression.  Also discusses healthy ways to reduce stress and to treat/manage anxiety and depression. Written material provided at class time.   Education: Sleep Hygiene -Provides group verbal and written instruction about how sleep can affect your health.  Define sleep hygiene, discuss sleep cycles and impact of sleep habits. Review good sleep hygiene tips.   Initial Review & Psychosocial Screening:  Initial Psych Review & Screening - 09/06/23 1015       Initial Review   Current issues with None Identified      Family Dynamics   Good Support System? Yes    Comments He has two daughters, a son and a girlfriend for support. He states no mental instability.      Barriers   Psychosocial barriers to participate in program There are no identifiable barriers or psychosocial needs.;The patient should benefit from training in stress management and relaxation.      Screening Interventions   Interventions Encouraged to exercise;To provide support and resources with identified psychosocial needs;Provide feedback about the scores to participant    Expected Outcomes Short Term goal: Utilizing psychosocial counselor, staff and physician to assist with identification of specific Stressors or current issues interfering with healing process.  Setting desired goal for each stressor or current issue identified.;Long Term Goal: Stressors or current issues are controlled or eliminated.;Short Term goal: Identification and review with participant of any Quality of Life or Depression concerns found by scoring the questionnaire.;Long Term goal: The participant improves quality of Life and PHQ9 Scores as seen by post scores and/or verbalization of changes          Quality of Life Scores:   Quality of Life - 09/17/23 1521       Quality of Life   Select Quality of Life      Quality of Life Scores   Health/Function Pre 11 %    Socioeconomic Pre 24.75 %    Psych/Spiritual Pre 13.71 %    Family Pre 26.4 %    GLOBAL Pre 16.89 %         Scores of 19 and below usually indicate a poorer quality of life in these areas.  A difference of  2-3 points is a clinically meaningful difference.  A difference of 2-3 points in the total score of the Quality of Life Index has been associated with significant improvement in overall quality of life, self-image, physical symptoms, and general health in studies assessing change  in quality of life.  PHQ-9: Review Flowsheet       10/28/2023 09/17/2023  Depression screen PHQ 2/9  Decreased Interest 0 1  Down, Depressed, Hopeless 0 1  PHQ - 2 Score 0 2  Altered sleeping 1 1  Tired, decreased energy 2 3  Change in appetite 0 1  Feeling bad or failure about yourself  0 2  Trouble concentrating 0 0  Moving slowly or fidgety/restless 0 0  Suicidal thoughts 0 0  PHQ-9 Score 3 9  Difficult doing work/chores Not difficult at all Very difficult   Interpretation of Total Score  Total Score Depression Severity:  1-4 = Minimal depression, 5-9 = Mild depression, 10-14 = Moderate depression, 15-19 = Moderately severe depression, 20-27 = Severe depression   Psychosocial Evaluation and Intervention:  Psychosocial Evaluation - 09/06/23 1017       Psychosocial Evaluation & Interventions   Interventions  Encouraged to exercise with the program and follow exercise prescription;Relaxation education;Stress management education    Comments He has two daughters, a son and a girlfriend for support. He states no mental instability.    Expected Outcomes Short: Start HeartTrack to help with mood. Long: Maintain a healthy mental state    Continue Psychosocial Services  Follow up required by staff          Psychosocial Re-Evaluation:  Psychosocial Re-Evaluation     Row Name 10/28/23 1507             Psychosocial Re-Evaluation   Current issues with None Identified       Comments Thoms states he does not have any current stressors. He states he does not sleep great because he gets up to go to the bathroom 2-3 times a night, but does sleep soundly. He states he has a strong support system of his girlfriend, 2 daughters, and son that he can easily look to for support. He states he manages his stressors well and often will turn everything off and get out his bible and read. His PHQ score was reassessed today and it went down to 3.       Expected Outcomes Short: Continue to manage stressors that arise. Long: Continue to maintain a positive outlook on life.       Interventions Encouraged to attend Cardiac Rehabilitation for the exercise       Continue Psychosocial Services  Follow up required by staff          Psychosocial Discharge (Final Psychosocial Re-Evaluation):  Psychosocial Re-Evaluation - 10/28/23 1507       Psychosocial Re-Evaluation   Current issues with None Identified    Comments Jabri states he does not have any current stressors. He states he does not sleep great because he gets up to go to the bathroom 2-3 times a night, but does sleep soundly. He states he has a strong support system of his girlfriend, 2 daughters, and son that he can easily look to for support. He states he manages his stressors well and often will turn everything off and get out his bible and read. His PHQ score  was reassessed today and it went down to 3.    Expected Outcomes Short: Continue to manage stressors that arise. Long: Continue to maintain a positive outlook on life.    Interventions Encouraged to attend Cardiac Rehabilitation for the exercise    Continue Psychosocial Services  Follow up required by staff          Vocational Rehabilitation: Provide vocational rehab assistance  to qualifying candidates.   Vocational Rehab Evaluation & Intervention:   Education: Education Goals: Education classes will be provided on a variety of topics geared toward better understanding of heart health and risk factor modification. Participant will state understanding/return demonstration of topics presented as noted by education test scores.  Learning Barriers/Preferences:  Learning Barriers/Preferences - 09/06/23 1014       Learning Barriers/Preferences   Learning Barriers None    Learning Preferences None          General Cardiac Education Topics:  AED/CPR: - Group verbal and written instruction with the use of models to demonstrate the basic use of the AED with the basic ABC's of resuscitation.   Test and Procedures: - Group verbal and visual presentation and models provide information about basic cardiac anatomy and function. Reviews the testing methods done to diagnose heart disease and the outcomes of the test results. Describes the treatment choices: Medical Management, Angioplasty, or Coronary Bypass Surgery for treating various heart conditions including Myocardial Infarction, Angina, Valve Disease, and Cardiac Arrhythmias. Written material provided at class time.   Medication Safety: - Group verbal and visual instruction to review commonly prescribed medications for heart and lung disease. Reviews the medication, class of the drug, and side effects. Includes the steps to properly store meds and maintain the prescription regimen. Written material provided at class  time.   Intimacy: - Group verbal instruction through game format to discuss how heart and lung disease can affect sexual intimacy. Written material provided at class time. Flowsheet Row Cardiac Rehab from 11/20/2023 in [facility] Cardiac and Pulmonary Rehab  Date 11/20/23  Educator mb  Instruction Review Code 1- Verbalizes Understanding    Know Your Numbers and Heart Failure: - Group verbal and visual instruction to discuss disease risk factors for cardiac and pulmonary disease and treatment options.  Reviews associated critical values for Overweight/Obesity, Hypertension, Cholesterol, and Diabetes.  Discusses basics of heart failure: signs/symptoms and treatments.  Introduces Heart Failure Zone chart for action plan for heart failure. Written material provided at class time. Flowsheet Row Cardiac Rehab from 11/20/2023 in [facility] Cardiac and Pulmonary Rehab  Date 10/16/23  Educator mc  Instruction Review Code 1- Verbalizes Understanding    Infection Prevention: - Provides verbal and written material to individual with discussion of infection control including proper hand washing and proper equipment cleaning during exercise session. Flowsheet Row Cardiac Rehab from 11/20/2023 in [facility] Cardiac and Pulmonary Rehab  Date 09/17/23  Educator [facility]  Instruction Review Code 1- Verbalizes Understanding    Falls Prevention: - Provides verbal and written material to individual with discussion of falls prevention and safety. Flowsheet Row Cardiac Rehab from 11/20/2023 in [facility] Cardiac and Pulmonary Rehab  Date 09/17/23  Educator [facility]  Instruction Review Code 1- Verbalizes Understanding    Other: -Provides group and verbal instruction on various topics (see comments)   Knowledge Questionnaire Score:  Knowledge Questionnaire Score - 09/17/23 1523       Knowledge Questionnaire Score   Pre Score 26          Core Components/Risk Factors/Patient Goals at Admission:  Personal Goals and Risk Factors  at Admission - 09/06/23 1014       Core Components/Risk Factors/Patient Goals on Admission    Weight Management Yes;Weight Loss    Intervention Weight Management: Develop a combined nutrition and exercise program designed to reach desired caloric intake, while maintaining appropriate intake of nutrient and fiber, sodium and fats, and appropriate energy expenditure required for the  weight goal.;Weight Management: Provide education and appropriate resources to help participant work on and attain dietary goals.;Weight Management/Obesity: Establish reasonable short term and long term weight goals.    Expected Outcomes Short Term: Continue to assess and modify interventions until short term weight is achieved;Long Term: Adherence to nutrition and physical activity/exercise program aimed toward attainment of established weight goal;Weight Loss: Understanding of general recommendations for a balanced deficit meal plan, which promotes 1-2 lb weight loss per week and includes a negative energy balance of [phone] kcal/d;Understanding recommendations for meals to include 15-35% energy as protein, 25-35% energy from fat, 35-60% energy from carbohydrates, less than 200mg  of dietary cholesterol, 20-35 gm of total fiber daily;Understanding of distribution of calorie intake throughout the day with the consumption of 4-5 meals/snacks    Hypertension Yes    Intervention Provide education on lifestyle modifcations including regular physical activity/exercise, weight management, moderate sodium restriction and increased consumption of fresh fruit, vegetables, and low fat dairy, alcohol  moderation, and smoking cessation.;Monitor prescription use compliance.    Expected Outcomes Short Term: Continued assessment and intervention until BP is < 140/37mm HG in hypertensive participants. < 130/30mm HG in hypertensive participants with diabetes, heart failure or chronic kidney disease.;Long Term: Maintenance of blood pressure at goal  levels.    Lipids Yes    Intervention Provide education and support for participant on nutrition & aerobic/resistive exercise along with prescribed medications to achieve LDL 70mg , HDL >40mg .    Expected Outcomes Short Term: Participant states understanding of desired cholesterol values and is compliant with medications prescribed. Participant is following exercise prescription and nutrition guidelines.;Long Term: Cholesterol controlled with medications as prescribed, with individualized exercise RX and with personalized nutrition plan. Value goals: LDL < 70mg , HDL > 40 mg.          Education:Diabetes - Individual verbal and written instruction to review signs/symptoms of diabetes, desired ranges of glucose level fasting, after meals and with exercise. Acknowledge that pre and post exercise glucose checks will be done for 3 sessions at entry of program.   Core Components/Risk Factors/Patient Goals Review:   Goals and Risk Factor Review     Row Name 10/28/23 1510             Core Components/Risk Factors/Patient Goals Review   Personal Goals Review Weight Management/Obesity;Hypertension       Review Camren states he is monitoring his blood pressure at home with his wrist monitor and is getting similar readings as he does in rehab. He states he is checking his weight daily and has a goal to get down to 190lb. He knows it will take discipline and be hard. He has switched to drinking water and will occasionally have a soft drink. He also started to split meals with his girlfriend when they go out to help with portion size.       Expected Outcomes Short: Cotinue monitoring weight and blood pressure at home. Long: Continue managing cardiovascular risk factors.          Core Components/Risk Factors/Patient Goals at Discharge (Final Review):   Goals and Risk Factor Review - 10/28/23 1510       Core Components/Risk Factors/Patient Goals Review   Personal Goals Review Weight  Management/Obesity;Hypertension    Review Nhia states he is monitoring his blood pressure at home with his wrist monitor and is getting similar readings as he does in rehab. He states he is checking his weight daily and has a goal to get down to 190lb. He knows  it will take discipline and be hard. He has switched to drinking water and will occasionally have a soft drink. He also started to split meals with his girlfriend when they go out to help with portion size.    Expected Outcomes Short: Cotinue monitoring weight and blood pressure at home. Long: Continue managing cardiovascular risk factors.          ITP Comments:  ITP Comments     Row Name 09/06/23 1012 09/17/23 1510 09/23/23 1406 10/02/23 0955 10/30/23 1439   ITP Comments Virtual Visit completed. Patient informed on EP and RD appointment and 6 Minute walk test. Patient also informed of patient health questionnaires on My Chart. Patient Verbalizes understanding. Visit diagnosis can be found in [facility] 4//29/2025. Completed and gym orientation for cardiac rehab. Initial ITP created and sent for review to Dr. Oneil Pinal, Medical Director. First full day of exercise!  Patient was oriented to gym and equipment including functions, settings, policies, and procedures.  Patient's individual exercise prescription and treatment plan were reviewed.  All starting workloads were established based on the results of the 6 minute walk test done at initial orientation visit.  The plan for exercise progression was also introduced and progression will be customized based on patient's performance and goals. 30 Day review completed. Medical Director ITP review done; changes made as directed and signed approval by Medical Director. New to program. 30 Day review completed. Medical Director ITP review done; changes made as directed and signed approval by Medical Director.    Row Name 11/27/23 0946           ITP Comments 30 Day review completed. Medical  Director ITP review done; changes made as directed and signed approval by Medical Director.          Comments: 30 day review

## 2023-11-27 NOTE — Progress Notes (Signed)
 Daily Session Note  Patient Details  Name: Theodore Wiggins MRN: [id number] Date of Birth: February 16, 1945 Referring Provider:   Flowsheet Row Cardiac Rehab from 09/17/2023 in [facility] Cardiac and Pulmonary Rehab  Referring Provider Dr. Keller Paterson    Encounter Date: 11/27/2023  Check In:  Session Check In - 11/27/23 1347       Check-In   Supervising physician immediately available to respond to emergencies See telemetry face sheet for immediately available ER MD    Location [facility]-Cardiac & Pulmonary Rehab    Staff Present Burnard Davenport RN,BSN,MPA;Laura Cates RN,BSN;Noah Laird, BS, Exercise Physiologist;Belem Hintze Dyane HECKLE, ACSM CEP, Exercise Physiologist    Virtual Visit No    Medication changes reported     No    Fall or balance concerns reported    No    Tobacco Cessation No Change    Warm-up and Cool-down Performed on first and last piece of equipment    Resistance Training Performed Yes    VAD Patient? No    PAD/SET Patient? No      Pain Assessment   Currently in Pain? No/denies             Social History   Tobacco Use  Smoking Status Never  Smokeless Tobacco Current   Types: Snuff    Goals Met:  Independence with exercise equipment Exercise tolerated well No report of concerns or symptoms today Strength training completed today  Goals Unmet:  Not Applicable  Comments: Pt able to follow exercise prescription today without complaint.  Will continue to monitor for progression.    Reviewed home exercise with pt today.  Pt plans to use rower, treadmill, and walking outside for exercise.  Reviewed THR, pulse, RPE, sign and symptoms, pulse oximetery and when to call 911 or MD.  Also discussed weather considerations and indoor options.  Pt voiced understanding.    Dr. Oneil Pinal is Medical Director for [facility] Cardiac Rehabilitation.  Dr. Fuad Aleskerov is Medical Director for [facility] Pulmonary Rehabilitation.

## 2023-12-02 ENCOUNTER — Encounter: Admitting: Emergency Medicine

## 2023-12-02 DIAGNOSIS — I214 Non-ST elevation (NSTEMI) myocardial infarction: Secondary | ICD-10-CM

## 2023-12-02 NOTE — Progress Notes (Signed)
 Daily Session Note  Patient Details  Name: Theodore Wiggins MRN: [id number] Date of Birth: 1945-10-28 Referring Provider:   Flowsheet Row Cardiac Rehab from 09/17/2023 in [facility] Cardiac and Pulmonary Rehab  Referring Provider Dr. Keller Paterson    Encounter Date: 12/02/2023  Check In:  Session Check In - 12/02/23 1342       Check-In   Supervising physician immediately available to respond to emergencies See telemetry face sheet for immediately available ER MD    Location [facility]-Cardiac & Pulmonary Rehab    Staff Present Rollene Paterson, MS, Exercise Physiologist;Noah Tickle, BS, Exercise Physiologist;Augie Vane RN,BSN;Maxon Conetta BS, Exercise Physiologist    Virtual Visit No    Medication changes reported     No    Fall or balance concerns reported    No    Tobacco Cessation No Change    Warm-up and Cool-down Performed on first and last piece of equipment    Resistance Training Performed Yes    VAD Patient? No    PAD/SET Patient? No      Pain Assessment   Currently in Pain? No/denies             Social History   Tobacco Use  Smoking Status Never  Smokeless Tobacco Current   Types: Snuff    Goals Met:  Independence with exercise equipment Exercise tolerated well No report of concerns or symptoms today Strength training completed today  Goals Unmet:  Not Applicable  Comments: Pt able to follow exercise prescription today without complaint.  Will continue to monitor for progression.    Dr. Oneil Pinal is Medical Director for [facility] Cardiac Rehabilitation.  Dr. Fuad Aleskerov is Medical Director for [facility] Pulmonary Rehabilitation.

## 2023-12-04 ENCOUNTER — Encounter

## 2023-12-04 DIAGNOSIS — I214 Non-ST elevation (NSTEMI) myocardial infarction: Secondary | ICD-10-CM

## 2023-12-04 DIAGNOSIS — Z955 Presence of coronary angioplasty implant and graft: Secondary | ICD-10-CM

## 2023-12-04 NOTE — Progress Notes (Signed)
 Daily Session Note  Patient Details  Name: Theodore Wiggins MRN: [id number] Date of Birth: [birth year] Referring Provider:   Flowsheet Row Cardiac Rehab from 09/17/2023 in [facility] Cardiac and Pulmonary Rehab  Referring Provider Dr. Keller Paterson    Encounter Date: 12/04/2023  Check In:  Session Check In - 12/04/23 1416       Check-In   Supervising physician immediately available to respond to emergencies See telemetry face sheet for immediately available ER MD    Location [facility]-Cardiac & Pulmonary Rehab    Staff Present Burnard Davenport RN,BSN,MPA;Laura Cates RN,BSN;Noah Laird, BS, Exercise Physiologist;Astraea Gaughran Dyane HECKLE, ACSM CEP, Exercise Physiologist    Virtual Visit No    Medication changes reported     No    Fall or balance concerns reported    No    Tobacco Cessation No Change    Warm-up and Cool-down Performed on first and last piece of equipment    Resistance Training Performed Yes    VAD Patient? No    PAD/SET Patient? No      Pain Assessment   Currently in Pain? No/denies             Social History   Tobacco Use  Smoking Status Never  Smokeless Tobacco Current   Types: Snuff    Goals Met:  Independence with exercise equipment Exercise tolerated well No report of concerns or symptoms today Strength training completed today  Goals Unmet:  Not Applicable  Comments: Pt able to follow exercise prescription today without complaint.  Will continue to monitor for progression.    Dr. Oneil Pinal is Medical Director for [facility] Cardiac Rehabilitation.  Dr. Fuad Aleskerov is Medical Director for [facility] Pulmonary Rehabilitation.

## 2023-12-09 ENCOUNTER — Encounter: Attending: Cardiology | Admitting: Emergency Medicine

## 2023-12-09 DIAGNOSIS — I252 Old myocardial infarction: Secondary | ICD-10-CM | POA: Diagnosis not present

## 2023-12-09 DIAGNOSIS — Z48812 Encounter for surgical aftercare following surgery on the circulatory system: Secondary | ICD-10-CM | POA: Insufficient documentation

## 2023-12-09 DIAGNOSIS — I214 Non-ST elevation (NSTEMI) myocardial infarction: Secondary | ICD-10-CM

## 2023-12-09 DIAGNOSIS — Z955 Presence of coronary angioplasty implant and graft: Secondary | ICD-10-CM | POA: Insufficient documentation

## 2023-12-09 NOTE — Progress Notes (Signed)
 Daily Session Note  Patient Details  Name: Theodore Wiggins MRN: [id number] Date of Birth: 10/09/1945 Referring Provider:   Flowsheet Row Cardiac Rehab from 09/17/2023 in [facility] Cardiac and Pulmonary Rehab  Referring Provider Dr. Keller Paterson    Encounter Date: 12/09/2023  Check In:  Session Check In - 12/09/23 1356       Check-In   Supervising physician immediately available to respond to emergencies See telemetry face sheet for immediately available ER MD    Location [facility]-Cardiac & Pulmonary Rehab    Staff Present Leita Franks RN,BSN;Maxon Burnell BS, Exercise Physiologist;Margaret Best, MS, Exercise Physiologist;Noah Tickle, BS, Exercise Physiologist    Virtual Visit No    Medication changes reported     No    Fall or balance concerns reported    No    Tobacco Cessation No Change    Warm-up and Cool-down Performed on first and last piece of equipment    Resistance Training Performed Yes    VAD Patient? No    PAD/SET Patient? No      Pain Assessment   Currently in Pain? No/denies             Social History   Tobacco Use  Smoking Status Never  Smokeless Tobacco Current   Types: Snuff    Goals Met:  Independence with exercise equipment Exercise tolerated well No report of concerns or symptoms today Strength training completed today  Goals Unmet:  Not Applicable  Comments: Pt able to follow exercise prescription today without complaint.  Will continue to monitor for progression.    Dr. Oneil Pinal is Medical Director for [facility] Cardiac Rehabilitation.  Dr. Fuad Aleskerov is Medical Director for [facility] Pulmonary Rehabilitation.

## 2023-12-11 ENCOUNTER — Encounter

## 2023-12-16 ENCOUNTER — Encounter: Admitting: Emergency Medicine

## 2023-12-16 DIAGNOSIS — Z955 Presence of coronary angioplasty implant and graft: Secondary | ICD-10-CM

## 2023-12-16 DIAGNOSIS — I214 Non-ST elevation (NSTEMI) myocardial infarction: Secondary | ICD-10-CM

## 2023-12-16 NOTE — Progress Notes (Signed)
 Daily Session Note  Patient Details  Name: Theodore Wiggins MRN: [id number] Date of Birth: [birth year] Referring Provider:   Flowsheet Row Cardiac Rehab from 09/17/2023 in [facility] Cardiac and Pulmonary Rehab  Referring Provider Dr. Keller Paterson    Encounter Date: 12/16/2023  Check In:  Session Check In - 12/16/23 1350       Check-In   Supervising physician immediately available to respond to emergencies See telemetry face sheet for immediately available ER MD    Location [facility]-Cardiac & Pulmonary Rehab    Staff Present Devaughn Jaeger, BS, Exercise Physiologist;Margaret Best, MS, Exercise Physiologist;Holland Nickson RN,BSN;Maxon Conetta BS, Exercise Physiologist    Virtual Visit No    Medication changes reported     No    Fall or balance concerns reported    No    Tobacco Cessation No Change    Warm-up and Cool-down Performed on first and last piece of equipment    Resistance Training Performed Yes    VAD Patient? No    PAD/SET Patient? No      Pain Assessment   Currently in Pain? No/denies             Social History   Tobacco Use  Smoking Status Never  Smokeless Tobacco Current   Types: Snuff    Goals Met:  Independence with exercise equipment Exercise tolerated well No report of concerns or symptoms today Strength training completed today  Goals Unmet:  Not Applicable  Comments: Pt able to follow exercise prescription today without complaint.  Will continue to monitor for progression.    Dr. Oneil Pinal is Medical Director for [facility] Cardiac Rehabilitation.  Dr. Fuad Aleskerov is Medical Director for [facility] Pulmonary Rehabilitation.

## 2023-12-18 ENCOUNTER — Encounter

## 2023-12-18 DIAGNOSIS — Z955 Presence of coronary angioplasty implant and graft: Secondary | ICD-10-CM

## 2023-12-18 DIAGNOSIS — I214 Non-ST elevation (NSTEMI) myocardial infarction: Secondary | ICD-10-CM

## 2023-12-18 NOTE — Progress Notes (Signed)
 Daily Session Note  Patient Details  Name: CHRISTPOHER SIEVERS MRN: [id number] Date of Birth: 1945/10/03 Referring Provider:   Flowsheet Row Cardiac Rehab from 09/17/2023 in [facility] Cardiac and Pulmonary Rehab  Referring Provider Dr. Keller Paterson    Encounter Date: 12/18/2023  Check In:  Session Check In - 12/18/23 1357       Check-In   Supervising physician immediately available to respond to emergencies See telemetry face sheet for immediately available ER MD    Location [facility]-Cardiac & Pulmonary Rehab    Staff Present Burnard Davenport RN,BSN,MPA;Laura Cates RN,BSN;Noah Laird, BS, Exercise Physiologist;Shonda Mandarino Dyane HECKLE, ACSM CEP, Exercise Physiologist    Virtual Visit No    Medication changes reported     No    Fall or balance concerns reported    No    Tobacco Cessation No Change    Warm-up and Cool-down Performed on first and last piece of equipment    Resistance Training Performed Yes    VAD Patient? No    PAD/SET Patient? No      Pain Assessment   Currently in Pain? No/denies             Social History   Tobacco Use  Smoking Status Never  Smokeless Tobacco Current   Types: Snuff    Goals Met:  Proper associated with RPD/PD & O2 Sat Independence with exercise equipment Exercise tolerated well No report of concerns or symptoms today Strength training completed today  Goals Unmet:  Not Applicable  Comments: Pt able to follow exercise prescription today without complaint.  Will continue to monitor for progression.    Dr. Oneil Pinal is Medical Director for [facility] Cardiac Rehabilitation.  Dr. Fuad Aleskerov is Medical Director for [facility] Pulmonary Rehabilitation.

## 2023-12-23 ENCOUNTER — Encounter: Admitting: Emergency Medicine

## 2023-12-23 DIAGNOSIS — I214 Non-ST elevation (NSTEMI) myocardial infarction: Secondary | ICD-10-CM

## 2023-12-23 DIAGNOSIS — Z955 Presence of coronary angioplasty implant and graft: Secondary | ICD-10-CM

## 2023-12-23 NOTE — Progress Notes (Signed)
 Daily Session Note  Patient Details  Name: Theodore Wiggins MRN: [id number] Date of Birth: 1945-04-09 Referring Provider:   Flowsheet Row Cardiac Rehab from 09/17/2023 in [facility] Cardiac and Pulmonary Rehab  Referring Provider Dr. Keller Paterson    Encounter Date: 12/23/2023  Check In:  Session Check In - 12/23/23 1343       Check-In   Supervising physician immediately available to respond to emergencies See telemetry face sheet for immediately available ER MD    Location [facility]-Cardiac & Pulmonary Rehab    Staff Present Hoy Rodney RN,BSN;Dimas Scheck RN,BSN;Margaret Best, MS, Exercise Physiologist;Noah Tickle, BS, Exercise Physiologist    Virtual Visit No    Medication changes reported     No    Fall or balance concerns reported    No    Tobacco Cessation No Change    Warm-up and Cool-down Performed on first and last piece of equipment    Resistance Training Performed Yes    VAD Patient? No    PAD/SET Patient? No      Pain Assessment   Currently in Pain? No/denies             Social History   Tobacco Use  Smoking Status Never  Smokeless Tobacco Current   Types: Snuff    Goals Met:  Independence with exercise equipment Exercise tolerated well No report of concerns or symptoms today Strength training completed today  Goals Unmet:  Not Applicable  Comments: Pt able to follow exercise prescription today without complaint.  Will continue to monitor for progression.    Dr. Oneil Pinal is Medical Director for [facility] Cardiac Rehabilitation.  Dr. Fuad Aleskerov is Medical Director for [facility] Pulmonary Rehabilitation.

## 2023-12-25 ENCOUNTER — Encounter

## 2023-12-25 DIAGNOSIS — I214 Non-ST elevation (NSTEMI) myocardial infarction: Secondary | ICD-10-CM

## 2023-12-25 DIAGNOSIS — Z955 Presence of coronary angioplasty implant and graft: Secondary | ICD-10-CM

## 2023-12-25 NOTE — Progress Notes (Signed)
 Cardiac Individual Treatment Plan  Patient Details  Name: JAMYSON JIRAK MRN: [id number] Date of Birth: [birth year] Referring Provider:   Flowsheet Row Cardiac Rehab from 09/17/2023 in [facility] Cardiac and Pulmonary Rehab  Referring Provider Dr. Keller Paterson    Initial Encounter Date:  Flowsheet Row Cardiac Rehab from 09/17/2023 in [facility] Cardiac and Pulmonary Rehab  Date 09/17/23    Visit Diagnosis: Status post coronary artery stent placement  NSTEMI (non-ST elevated myocardial infarction) [facility])  Patient's Home Medications on Admission:  Current Outpatient Medications:    aspirin  81 MG chewable tablet, Chew 81 mg by mouth. (Patient not taking: Reported on 09/06/2023), Disp: , Rfl:    aspirin  81 MG chewable tablet, Chew 81 mg by mouth., Disp: , Rfl:    carvedilol (COREG) 6.25 MG tablet, Take 6.25 mg by mouth., Disp: , Rfl:    clopidogrel (PLAVIX) 75 MG tablet, Take 75 mg by mouth., Disp: , Rfl:    furosemide  (LASIX ) 20 MG tablet, Take 20 mg by mouth daily. (Patient not taking: Reported on 09/06/2023), Disp: , Rfl:    furosemide  (LASIX ) 40 MG tablet, Take 40 mg by mouth daily. (Patient not taking: Reported on 09/06/2023), Disp: , Rfl:    Multiple Vitamins-Minerals (CENTRUM SILVER 50+MEN PO), Take by mouth., Disp: , Rfl:    nitroGLYCERIN  (NITROSTAT ) 0.4 MG SL tablet, Place 0.4 mg under the tongue every 5 (five) minutes as needed for chest pain., Disp: , Rfl:    nitroGLYCERIN  (NITROSTAT ) 0.4 MG SL tablet, Place 0.4 mg under the tongue. (Patient not taking: Reported on 09/06/2023), Disp: , Rfl:    omeprazole (PRILOSEC) 20 MG capsule, Take 20 mg by mouth daily. (Patient not taking: Reported on 09/06/2023), Disp: , Rfl:    pantoprazole (PROTONIX) 40 MG tablet, Take 40 mg by mouth., Disp: , Rfl:    rosuvastatin (CRESTOR) 20 MG tablet, Take 1 tablet by mouth at bedtime., Disp: , Rfl:    sacubitril-valsartan (ENTRESTO) 24-26 MG, Take 1 tablet by mouth every 12 (twelve) hours., Disp: , Rfl:    spironolactone  (ALDACTONE) 25 MG tablet, Take 12.5 mg by mouth., Disp: , Rfl:    ticagrelor (BRILINTA) 90 MG TABS tablet, Take 90 mg by mouth 2 (two) times daily. (Patient not taking: Reported on 09/06/2023), Disp: , Rfl:   Past Medical History: Past Medical History:  Diagnosis Date   Arthritis    Coronary artery disease    DDD (degenerative disc disease), lumbar    DDD (degenerative disc disease), lumbar    Depression    Dyspnea    GERD (gastroesophageal reflux disease)    Hyperlipidemia    Hypertension    NSTEMI (non-ST elevated myocardial infarction) (HCC) 06/04/2023   Post herpetic neuralgia    Vertigo     Tobacco Use: Social History   Tobacco Use  Smoking Status Never  Smokeless Tobacco Current   Types: Snuff    Labs: Review Flowsheet       Latest Ref Rng & Units 10/01/2017 04/29/2020 06/04/2023  Labs for ITP Cardiac and Pulmonary Rehab  Cholestrol 0 - 200 mg/dL - - 885   LDL (calc) 0 - 99 mg/dL - - 63   HDL-C >59 mg/dL - - 34   Trlycerides <849 mg/dL - - 84   Hemoglobin J8r 4.8 - 5.6 % 5.5  - 5.5   O2 Saturation % - 97.1  -     Exercise Target Goals: Exercise Program Goal: Individual exercise prescription set using results from initial 6 min walk  test and THRR while considering  patient's activity barriers and safety.   Exercise Prescription Goal: Initial exercise prescription builds to 30-45 minutes a day of aerobic activity, 2-3 days per week.  Home exercise guidelines will be given to patient during program as part of exercise prescription that the participant will acknowledge.   Education: Aerobic Exercise: - Group verbal and visual presentation on the components of exercise prescription. Introduces F.I.T.T principle from ACSM for exercise prescriptions.  Reviews F.I.T.T. principles of aerobic exercise including progression. Written material provided at class time. Flowsheet Row Cardiac Rehab from 12/04/2023 in [facility] Cardiac and Pulmonary Rehab  Education need identified  09/17/23  Date 11/20/23  Educator mb  Instruction Review Code 1- Verbalizes Understanding    Education: Resistance Exercise: - Group verbal and visual presentation on the components of exercise prescription. Introduces F.I.T.T principle from ACSM for exercise prescriptions  Reviews F.I.T.T. principles of resistance exercise including progression. Written material provided at class time.    Education: Exercise & Equipment Safety: - Individual verbal instruction and demonstration of equipment use and safety with use of the equipment. Flowsheet Row Cardiac Rehab from 12/04/2023 in [facility] Cardiac and Pulmonary Rehab  Date 09/17/23  Educator [facility]  Instruction Review Code 1- Verbalizes Understanding    Education: Exercise Physiology & General Exercise Guidelines: - Group verbal and written instruction with models to review the exercise physiology of the cardiovascular system and associated critical values. Provides general exercise guidelines with specific guidelines to those with heart or lung disease. Written material provided at class time. Flowsheet Row Cardiac Rehab from 12/04/2023 in [facility] Cardiac and Pulmonary Rehab  Education need identified 09/17/23    Education: Flexibility, Balance, Mind/Body Relaxation: - Group verbal and visual presentation with interactive activity on the components of exercise prescription. Introduces F.I.T.T principle from ACSM for exercise prescriptions. Reviews F.I.T.T. principles of flexibility and balance exercise training including progression. Also discusses the mind body connection.  Reviews various relaxation techniques to help reduce and manage stress (i.e. Deep breathing, progressive muscle relaxation, and visualization). Balance handout provided to take home. Written material provided at class time.   Activity Barriers & Risk Stratification:   6 Minute Walk:  6 Minute Walk     Row Name 09/17/23 1511         6 Minute Walk   Phase Initial      Distance 920 feet     Walk Time 6 minutes     # of Rest Breaks 0     MPH 1.74     METS 1.5     RPE 13     Perceived Dyspnea  2.5     VO2 Peak 5.25     Symptoms Yes (comment)     Comments SOB     Resting HR 62 bpm     Resting BP 122/70     Resting Oxygen Saturation  97 %     Exercise Oxygen Saturation  during 6 min walk 100 %     Max Ex. HR 75 bpm     Max Ex. BP 138/64     2 Minute Post BP 116/66        Oxygen Initial Assessment:   Oxygen Re-Evaluation:   Oxygen Discharge (Final Oxygen Re-Evaluation):   Initial Exercise Prescription:  Initial Exercise Prescription - 09/17/23 1500       Date of Initial Exercise RX and Referring Provider   Date 09/17/23    Referring Provider Dr. Keller Paterson  Oxygen   Maintain Oxygen Saturation 88% or higher      Treadmill   MPH 1.7    Grade 0    Minutes 15    METs 2.3      Recumbant Bike   Level 2    RPM 50    Watts 25    Minutes 15    METs 1.5      T5 Nustep   Level 2    SPM 80    Minutes 15    METs 1.5      Track   Laps 24    Minutes 15    METs 2.31      Prescription Details   Duration Progress to 30 minutes of continuous aerobic without signs/symptoms of physical distress      Intensity   THRR 40-80% of Max Heartrate 94-126    Ratings of Perceived Exertion 11-13    Perceived Dyspnea 0-4      Progression   Progression Continue to progress workloads to maintain intensity without signs/symptoms of physical distress.      Resistance Training   Training Prescription Yes    Weight 4lb    Reps 10-15          Perform Capillary Blood Glucose checks as needed.  Exercise Prescription Changes:   Exercise Prescription Changes     Row Name 09/17/23 1500 10/09/23 1500 10/24/23 1300 11/06/23 0800 11/27/23 1400     Response to Exercise   Blood Pressure (Admit) 122/70 142/80 124/82 130/84 --   Blood Pressure (Exercise) 138/64 146/86 120/80 134/78 --   Blood Pressure (Exit) 116/66 120/64 110/62 122/66  --   Heart Rate (Admit) 62 bpm 67 bpm 77 bpm 76 bpm --   Heart Rate (Exercise) 75 bpm 95 bpm 90 bpm 100 bpm --   Heart Rate (Exit) 61 bpm 76 bpm 74 bpm 76 bpm --   Oxygen Saturation (Admit) 97 % -- -- -- --   Oxygen Saturation (Exercise) 100 % -- -- -- --   Oxygen Saturation (Exit) 100 % -- -- -- --   Rating of Perceived Exertion (Exercise) 13 13 15 15  --   Perceived Dyspnea (Exercise) 2.5 -- -- -- --   Symptoms SOB none none none --   Comments results 1st 2 weeks of exercise -- -- --   Duration -- Progress to 30 minutes of  aerobic without signs/symptoms of physical distress Continue with 30 min of aerobic exercise without signs/symptoms of physical distress. Continue with 30 min of aerobic exercise without signs/symptoms of physical distress. Continue with 30 min of aerobic exercise without signs/symptoms of physical distress.   Intensity -- THRR unchanged THRR unchanged THRR unchanged THRR unchanged     Progression   Progression -- Continue to progress workloads to maintain intensity without signs/symptoms of physical distress. Continue to progress workloads to maintain intensity without signs/symptoms of physical distress. Continue to progress workloads to maintain intensity without signs/symptoms of physical distress. Continue to progress workloads to maintain intensity without signs/symptoms of physical distress.   Average METs -- 2.24 2.25 2.62 2.62     Resistance Training   Training Prescription -- Yes Yes Yes Yes   Weight -- 4lb 4 lb 4 lb 4 lb   Reps -- 10-15 10-15 10-15 10-15     Interval Training   Interval Training -- No No No No     Oxygen   Oxygen -- Continuous -- -- --     Treadmill  MPH -- 2 1.7 2 2    Grade -- 0 0 0 0   Minutes -- 15 15 15 15    METs -- 2.53 2.3 2.53 2.53     Recumbant Bike   Level -- 3 -- -- --   Watts -- 25 -- -- --   Minutes -- 15 -- -- --   METs -- 2.83 -- -- --     T5 Nustep   Level -- 5 5 5 5    Minutes -- 15 15 15 15    METs -- 2  2.2 2.4 2.4     Biostep-RELP   Level -- -- -- 7 7   Minutes -- -- -- 15 15   METs -- -- -- 3 3     Home Exercise Plan   Plans to continue exercise at -- -- -- -- Home (comment)  TM, rower, walking   Frequency -- -- -- -- Add 2 additional days to program exercise sessions.   Initial Home Exercises Provided -- -- -- -- 11/27/23     Oxygen   Maintain Oxygen Saturation -- 88% or higher 88% or higher 88% or higher 88% or higher    Row Name 12/04/23 1600 12/18/23 0800           Response to Exercise   Blood Pressure (Admit) 120/78 130/72      Blood Pressure (Exercise) 148/80 --      Blood Pressure (Exit) 122/70 130/72      Heart Rate (Admit) 60 bpm 81 bpm      Heart Rate (Exercise) 101 bpm 104 bpm      Heart Rate (Exit) 88 bpm 72 bpm      Rating of Perceived Exertion (Exercise) 15 15      Symptoms none none      Duration Continue with 30 min of aerobic exercise without signs/symptoms of physical distress. Continue with 30 min of aerobic exercise without signs/symptoms of physical distress.      Intensity THRR unchanged THRR unchanged        Progression   Progression Continue to progress workloads to maintain intensity without signs/symptoms of physical distress. Continue to progress workloads to maintain intensity without signs/symptoms of physical distress.      Average METs 2.99 3.22        Resistance Training   Training Prescription Yes Yes      Weight 4 lb 4 lb      Reps 10-15 10-15        Interval Training   Interval Training No No        Treadmill   MPH 2 2      Grade 3 4      Minutes 15 15      METs 3.36 3.63        NuStep   Level 5  T6 --      SPM 80 --      Minutes 15 --      METs 2.3 --        T5 Nustep   Level 3 4      Minutes 15 15      METs 2.2 2.4        Biostep-RELP   Level 8 5      Minutes 15 15      METs 4 4        Home Exercise Plan   Plans to continue exercise at Home (comment)  TM, rower, walking Home (comment)  TM,  rower, walking       Frequency Add 2 additional days to program exercise sessions. Add 2 additional days to program exercise sessions.      Initial Home Exercises Provided 11/27/23 11/27/23        Oxygen   Maintain Oxygen Saturation 88% or higher 88% or higher         Exercise Comments:   Exercise Comments     Row Name 09/23/23 1406           Exercise Comments First full day of exercise!  Patient was oriented to gym and equipment including functions, settings, policies, and procedures.  Patient's individual exercise prescription and treatment plan were reviewed.  All starting workloads were established based on the results of the 6 minute walk test done at initial orientation visit.  The plan for exercise progression was also introduced and progression will be customized based on patient's performance and goals.          Exercise Goals and Review:   Exercise Goals     Row Name 09/17/23 1515             Exercise Goals   Increase Physical Activity Yes       Intervention Provide advice, education, support and counseling about physical activity/exercise needs.;Develop an individualized exercise prescription for aerobic and resistive training based on initial evaluation findings, risk stratification, comorbidities and participant's personal goals.       Expected Outcomes Short Term: Attend rehab on a regular basis to increase amount of physical activity.;Long Term: Add in home exercise to make exercise part of routine and to increase amount of physical activity.;Long Term: Exercising regularly at least 3-5 days a week.       Increase Strength and Stamina Yes       Intervention Provide advice, education, support and counseling about physical activity/exercise needs.;Develop an individualized exercise prescription for aerobic and resistive training based on initial evaluation findings, risk stratification, comorbidities and participant's personal goals.       Expected Outcomes Short Term: Increase workloads  from initial exercise prescription for resistance, speed, and METs.;Short Term: Perform resistance training exercises routinely during rehab and add in resistance training at home;Long Term: Improve cardiorespiratory fitness, muscular endurance and strength as measured by increased METs and functional capacity ( )       Able to understand and use rate of perceived exertion (RPE) scale Yes       Intervention Provide education and explanation on how to use RPE scale       Expected Outcomes Short Term: Able to use RPE daily in rehab to express subjective intensity level;Long Term:  Able to use RPE to guide intensity level when exercising independently       Able to understand and use Dyspnea scale Yes       Intervention Provide education and explanation on how to use Dyspnea scale       Expected Outcomes Short Term: Able to use Dyspnea scale daily in rehab to express subjective sense of shortness of breath during exertion;Long Term: Able to use Dyspnea scale to guide intensity level when exercising independently       Knowledge and understanding of Target Heart Rate Range (THRR) Yes       Intervention Provide education and explanation of THRR including how the numbers were predicted and where they are located for reference       Expected Outcomes Short Term: Able to use daily as guideline for intensity in rehab;Short Term: Able  to state/look up THRR;Long Term: Able to use THRR to govern intensity when exercising independently       Able to check pulse independently Yes       Intervention Provide education and demonstration on how to check pulse in carotid and radial arteries.;Review the importance of being able to check your own pulse for safety during independent exercise       Expected Outcomes Short Term: Able to explain why pulse checking is important during independent exercise;Long Term: Able to check pulse independently and accurately       Understanding of Exercise Prescription Yes        Intervention Provide education, explanation, and written materials on patient's individual exercise prescription       Expected Outcomes Short Term: Able to explain program exercise prescription;Long Term: Able to explain home exercise prescription to exercise independently          Exercise Goals Re-Evaluation :  Exercise Goals Re-Evaluation     Row Name 09/23/23 1407 10/09/23 1538 10/24/23 1355 11/06/23 0825 11/21/23 1138     Exercise Goal Re-Evaluation   Exercise Goals Review Increase Physical Activity;Able to understand and use rate of perceived exertion (RPE) scale;Knowledge and understanding of Target Heart Rate Range (THRR);Understanding of Exercise Prescription;Increase Strength and Stamina;Able to understand and use Dyspnea scale;Able to check pulse independently Increase Physical Activity;Understanding of Exercise Prescription;Increase Strength and Stamina Increase Physical Activity;Understanding of Exercise Prescription;Increase Strength and Stamina Increase Physical Activity;Understanding of Exercise Prescription;Increase Strength and Stamina Increase Physical Activity;Understanding of Exercise Prescription;Increase Strength and Stamina   Comments Reviewed RPE and dyspnea scale, THR and program prescription with pt today.  Pt voiced understanding and was given a copy of goals to take home. Cledith is off to a good start in the program and completed his first 2 weeks of exercise sessions in this review. He worked at level 5 on the T5 nustep and level 3 on the recumbent bike. He had a workload on the treadmill of a speed of 2 mph and no incline. We will continue to monitor his progress in the program. [name] only attended two sessions since the last review. He continues to walk the treadmill but lowered his speed from 2 mph to 1.7 mph with no incline. He also has consistently worked at level 5 on the T5 nustep. We will continue to monitor his progress in the program. [name] only attended two  sessions since the last review. He increased his treadmill workload back up to a speed of 2 mph with no incline. He also began using the biostep and did well at level 7. We will continue to monitor his progress in the program. Krue did not attend rehab during the last review period due to sickness. He has since returned to exercise in the program. We will continue to monitor his progress in the program.   Expected Outcomes Short: Use RPE daily to regulate intensity.  Long: Follow program prescription in THR. Short: Continue to follow current exercise prescription. Long: Continue exercise to improve strength and stamina. Short: Attend rehab more consistently. Long: Continue exercise to improve strength and stamina. Short: Attend rehab more consistently. Long: Continue exercise to improve strength and stamina. Short: Return to regular attendance in the program. Long: Continue exercise to improve strength and stamina.    Row Name 11/27/23 1403 12/04/23 1355 12/04/23 1626 12/18/23 0839       Exercise Goal Re-Evaluation   Exercise Goals Review Increase Physical Activity;Increase Strength and Stamina;Able to understand and  use rate of perceived exertion (RPE) scale;Able to understand and use Dyspnea scale;Knowledge and understanding of Target Heart Rate Range (THRR);Able to check pulse independently;Understanding of Exercise Prescription Increase Strength and Stamina;Understanding of Exercise Prescription Increase Strength and Stamina;Understanding of Exercise Prescription;Increase Physical Activity Increase Strength and Stamina;Understanding of Exercise Prescription;Increase Physical Activity    Comments Reviewed home exercise with pt today.  Pt plans to use rower, treadmill, and walking outside for exercise.  Reviewed THR, pulse, RPE, sign and symptoms, pulse oximetery and when to call 911 or MD.  Also discussed weather considerations and indoor options.  Pt voiced understanding. 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 He has not yet added a day of structured exercise at home but continues to stay active. He was encouraged to start at least one day of home exericse. He does have equipment he can use at home. Zymeir is doing well in rehab. He was able to increase to level 8 on the biostep and worked at level 5 on the T6 nustep. He also increased his workload on the treadmill to a speed of 2 mph and incline of 3%. We will continue to monitor his progress in the program. Broughton is doing well in rehab. He was able to increase back up to level 4 on the T5 nustep. He also increased his workload on the treadmill to an incline of 4% while maintaining his speed at 2 mph. We will continue to monitor his progress in the program.    Expected Outcomes Short: add 1-2 days a week of exercise at home on off days of rehab. Long: maintain independent exercise routine upon graduation from cardiac rehab. Short: continue to stay active at home and add 1-2 days a week of structured exercise at home on off days of rehab. Long: maintain independent exercise routine upon graduation from cardiac rehab. Short: Continue to progressively increase treadmill and biostep workload. Long: Continue exercise to improve strength and stamina. Short: Continue to progressively increase workloads. Long: Continue exercise to improve strength and stamina.       Discharge Exercise Prescription (Final Exercise Prescription Changes):  Exercise Prescription Changes - 12/18/23 0800       Response to Exercise   Blood Pressure (Admit) 130/72    Blood Pressure (Exit) 130/72    Heart Rate (Admit) 81 bpm    Heart Rate (Exercise) 104 bpm    Heart Rate (Exit) 72 bpm    Rating of Perceived Exertion (Exercise) 15    Symptoms none    Duration Continue with 30 min of aerobic exercise without signs/symptoms of physical distress.    Intensity THRR unchanged      Progression   Progression Continue to  progress workloads to maintain intensity without signs/symptoms of physical distress.    Average METs 3.22      Resistance Training   Training Prescription Yes    Weight 4 lb    Reps 10-15      Interval Training   Interval Training No      Treadmill   MPH 2    Grade 4    Minutes 15    METs 3.63      T5 Nustep   Level 4    Minutes 15    METs 2.4      Biostep-RELP   Level 5    Minutes 15    METs 4      Home Exercise Plan   Plans  to continue exercise at Home (comment)   TM, rower, walking   Frequency Add 2 additional days to program exercise sessions.    Initial Home Exercises Provided 11/27/23      Oxygen   Maintain Oxygen Saturation 88% or higher          Nutrition:  Target Goals: Understanding of nutrition guidelines, daily intake of sodium 1500mg , cholesterol 200mg , calories 30% from fat and 7% or less from saturated fats, daily to have 5 or more servings of fruits and vegetables.  Education: Nutrition 1 -Group instruction provided by verbal, written material, interactive activities, discussions, models, and posters to present general guidelines for heart healthy nutrition including macronutrients, label reading, and promoting whole foods over processed counterparts. Education serves as pensions consultant of discussion of heart healthy eating for all. Written material provided at class time. Flowsheet Row Cardiac Rehab from 12/04/2023 in [facility] Cardiac and Pulmonary Rehab  Date 11/27/23  Educator jg  Instruction Review Code 1- Verbalizes Understanding     Education: Nutrition 2 -Group instruction provided by verbal, written material, interactive activities, discussions, models, and posters to present general guidelines for heart healthy nutrition including sodium, cholesterol, and saturated fat. Providing guidance of habit forming to improve blood pressure, cholesterol, and body weight. Written material provided at class time. Flowsheet Row Cardiac Rehab from 12/04/2023  in [facility] Cardiac and Pulmonary Rehab  Date 12/04/23  Educator jg  Instruction Review Code 1- Verbalizes Understanding      Biometrics:  Pre Biometrics - 09/17/23 1516       Pre Biometrics   Height 5' 10.1 (1.781 m)    Weight 208 lb 1.6 oz (94.4 kg)    Waist Circumference 43.5 inches    Hip Circumference 42.5 inches    Waist to Hip Ratio 1.02 %    BMI (Calculated) 29.76    Single Leg Stand 3.6 seconds           Nutrition Therapy Plan and Nutrition Goals:   Nutrition Assessments:  MEDIFICTS Score Key: >=70 Need to make dietary changes  40-70 Heart Healthy Diet <= 40 Therapeutic Level Cholesterol Diet  Flowsheet Row Cardiac Rehab from 09/17/2023 in [facility] Cardiac and Pulmonary Rehab  Picture Your Plate Total Score on Admission 56   Picture Your Plate Scores: <59 Unhealthy dietary pattern with much room for improvement. 41-50 Dietary pattern unlikely to meet recommendations for good health and room for improvement. 51-60 More healthful dietary pattern, with some room for improvement.  >60 Healthy dietary pattern, although there may be some specific behaviors that could be improved.    Nutrition Goals Re-Evaluation:  Nutrition Goals Re-Evaluation     Row Name 10/28/23 1510 12/04/23 1357           Goals   Nutrition Goal RD appointment deferred at this time RD appointment deferred at this time         Nutrition Goals Discharge (Final Nutrition Goals Re-Evaluation):  Nutrition Goals Re-Evaluation - 12/04/23 1357       Goals   Nutrition Goal RD appointment deferred at this time          Psychosocial: Target Goals: Acknowledge presence or absence of significant depression and/or stress, maximize coping skills, provide positive support system. Participant is able to verbalize types and ability to use techniques and skills needed for reducing stress and depression.   Education: Stress, Anxiety, and Depression - Group verbal and visual presentation to define  topics covered.  Reviews how body is impacted by stress, anxiety,  and depression.  Also discusses healthy ways to reduce stress and to treat/manage anxiety and depression. Written material provided at class time.   Education: Sleep Hygiene -Provides group verbal and written instruction about how sleep can affect your health.  Define sleep hygiene, discuss sleep cycles and impact of sleep habits. Review good sleep hygiene tips.   Initial Review & Psychosocial Screening:  Initial Psych Review & Screening - 09/06/23 1015       Initial Review   Current issues with None Identified      Family Dynamics   Good Support System? Yes    Comments He has two daughters, a son and a girlfriend for support. He states no mental instability.      Barriers   Psychosocial barriers to participate in program There are no identifiable barriers or psychosocial needs.;The patient should benefit from training in stress management and relaxation.      Screening Interventions   Interventions Encouraged to exercise;To provide support and resources with identified psychosocial needs;Provide feedback about the scores to participant    Expected Outcomes Short Term goal: Utilizing psychosocial counselor, staff and physician to assist with identification of specific Stressors or current issues interfering with healing process. Setting desired goal for each stressor or current issue identified.;Long Term Goal: Stressors or current issues are controlled or eliminated.;Short Term goal: Identification and review with participant of any Quality of Life or Depression concerns found by scoring the questionnaire.;Long Term goal: The participant improves quality of Life and PHQ9 Scores as seen by post scores and/or verbalization of changes          Quality of Life Scores:   Quality of Life - 09/17/23 1521       Quality of Life   Select Quality of Life      Quality of Life Scores   Health/Function Pre 11 %     Socioeconomic Pre 24.75 %    Psych/Spiritual Pre 13.71 %    Family Pre 26.4 %    GLOBAL Pre 16.89 %         Scores of 19 and below usually indicate a poorer quality of life in these areas.  A difference of  2-3 points is a clinically meaningful difference.  A difference of 2-3 points in the total score of the Quality of Life Index has been associated with significant improvement in overall quality of life, self-image, physical symptoms, and general health in studies assessing change in quality of life.  PHQ-9: Review Flowsheet       10/28/2023 09/17/2023  Depression screen PHQ 2/9  Decreased Interest 0 1  Down, Depressed, Hopeless 0 1  PHQ - 2 Score 0 2  Altered sleeping 1 1  Tired, decreased energy 2 3  Change in appetite 0 1  Feeling bad or failure about yourself  0 2  Trouble concentrating 0 0  Moving slowly or fidgety/restless 0 0  Suicidal thoughts 0 0  PHQ-9 Score 3  9   Difficult doing work/chores Not difficult at all Very difficult    Details       Data saved with a previous flowsheet row definition        Interpretation of Total Score  Total Score Depression Severity:  1-4 = Minimal depression, 5-9 = Mild depression, 10-14 = Moderate depression, 15-19 = Moderately severe depression, 20-27 = Severe depression   Psychosocial Evaluation and Intervention:  Psychosocial Evaluation - 09/06/23 1017       Psychosocial Evaluation & Interventions  Interventions Encouraged to exercise with the program and follow exercise prescription;Relaxation education;Stress management education    Comments He has two daughters, a son and a girlfriend for support. He states no mental instability.    Expected Outcomes Short: Start [facility] to help with mood. Long: Maintain a healthy mental state    Continue Psychosocial Services  Follow up required by staff          Psychosocial Re-Evaluation:  Psychosocial Re-Evaluation     Row Name 10/28/23 1507 12/04/23 1408            Psychosocial Re-Evaluation   Current issues with None Identified Current Sleep Concerns      Comments Juanmiguel states he does not have any current stressors. He states he does not sleep great because he gets up to go to the bathroom 2-3 times a night, but does sleep soundly. He states he has a strong support system of his girlfriend, 2 daughters, and son that he can easily look to for support. He states he manages his stressors well and often will turn everything off and get out his bible and read. His PHQ score was reassessed today and it went down to 3. Djon states that he continues to have a strong support system and feels blessed with his family and friends. He reports he is still not sleeping well and sometimes has low energy during the day. He was encouraged to talk to his doctor if this continues to be a concerns. Reports no concerns with stress, or mental health.      Expected Outcomes Short: Continue to manage stressors that arise. Long: Continue to maintain a positive outlook on life. Short: continue to attend cardiac rehab for mental health benefits of exercise. Talk to doctor about sleep concerns.  Long: maintain good mental health routine.      Interventions Encouraged to attend Cardiac Rehabilitation for the exercise Encouraged to attend Cardiac Rehabilitation for the exercise      Continue Psychosocial Services  Follow up required by staff Follow up required by staff         Psychosocial Discharge (Final Psychosocial Re-Evaluation):  Psychosocial Re-Evaluation - 12/04/23 1408       Psychosocial Re-Evaluation   Current issues with Current Sleep Concerns    Comments Oryon states that he continues to have a strong support system and feels blessed with his family and friends. He reports he is still not sleeping well and sometimes has low energy during the day. He was encouraged to talk to his doctor if this continues to be a concerns. Reports no concerns with stress, or mental  health.    Expected Outcomes Short: continue to attend cardiac rehab for mental health benefits of exercise. Talk to doctor about sleep concerns.  Long: maintain good mental health routine.    Interventions Encouraged to attend Cardiac Rehabilitation for the exercise    Continue Psychosocial Services  Follow up required by staff          Vocational Rehabilitation: Provide vocational rehab assistance to qualifying candidates.   Vocational Rehab Evaluation & Intervention:   Education: Education Goals: Education classes will be provided on a variety of topics geared toward better understanding of heart health and risk factor modification. Participant will state understanding/return demonstration of topics presented as noted by education test scores.  Learning Barriers/Preferences:  Learning Barriers/Preferences - 09/06/23 1014       Learning Barriers/Preferences   Learning Barriers None    Learning Preferences None  General Cardiac Education Topics:  AED/CPR: - Group verbal and written instruction with the use of models to demonstrate the basic use of the AED with the basic ABC's of resuscitation.   Test and Procedures: - Group verbal and visual presentation and models provide information about basic cardiac anatomy and function. Reviews the testing methods done to diagnose heart disease and the outcomes of the test results. Describes the treatment choices: Medical Management, Angioplasty, or Coronary Bypass Surgery for treating various heart conditions including Myocardial Infarction, Angina, Valve Disease, and Cardiac Arrhythmias. Written material provided at class time.   Medication Safety: - Group verbal and visual instruction to review commonly prescribed medications for heart and lung disease. Reviews the medication, class of the drug, and side effects. Includes the steps to properly store meds and maintain the prescription regimen. Written material provided at  class time.   Intimacy: - Group verbal instruction through game format to discuss how heart and lung disease can affect sexual intimacy. Written material provided at class time. Flowsheet Row Cardiac Rehab from 12/04/2023 in [facility] Cardiac and Pulmonary Rehab  Date 11/20/23  Educator mb  Instruction Review Code 1- Verbalizes Understanding    Know Your Numbers and Heart Failure: - Group verbal and visual instruction to discuss disease risk factors for cardiac and pulmonary disease and treatment options.  Reviews associated critical values for Overweight/Obesity, Hypertension, Cholesterol, and Diabetes.  Discusses basics of heart failure: signs/symptoms and treatments.  Introduces Heart Failure Zone chart for action plan for heart failure. Written material provided at class time. Flowsheet Row Cardiac Rehab from 12/04/2023 in [facility] Cardiac and Pulmonary Rehab  Date 10/16/23  Educator mc  Instruction Review Code 1- Verbalizes Understanding    Infection Prevention: - Provides verbal and written material to individual with discussion of infection control including proper hand washing and proper equipment cleaning during exercise session. Flowsheet Row Cardiac Rehab from 12/04/2023 in [facility] Cardiac and Pulmonary Rehab  Date 09/17/23  Educator [facility]  Instruction Review Code 1- Verbalizes Understanding    Falls Prevention: - Provides verbal and written material to individual with discussion of falls prevention and safety. Flowsheet Row Cardiac Rehab from 12/04/2023 in [facility] Cardiac and Pulmonary Rehab  Date 09/17/23  Educator [facility]  Instruction Review Code 1- Verbalizes Understanding    Other: -Provides group and verbal instruction on various topics (see comments)   Knowledge Questionnaire Score:  Knowledge Questionnaire Score - 09/17/23 1523       Knowledge Questionnaire Score   Pre Score 26          Core Components/Risk Factors/Patient Goals at Admission:  Personal Goals and Risk  Factors at Admission - 09/06/23 1014       Core Components/Risk Factors/Patient Goals on Admission    Weight Management Yes;Weight Loss    Intervention Weight Management: Develop a combined nutrition and exercise program designed to reach desired caloric intake, while maintaining appropriate intake of nutrient and fiber, sodium and fats, and appropriate energy expenditure required for the weight goal.;Weight Management: Provide education and appropriate resources to help participant work on and attain dietary goals.;Weight Management/Obesity: Establish reasonable short term and long term weight goals.    Expected Outcomes Short Term: Continue to assess and modify interventions until short term weight is achieved;Long Term: Adherence to nutrition and physical activity/exercise program aimed toward attainment of established weight goal;Weight Loss: Understanding of general recommendations for a balanced deficit meal plan, which promotes 1-2 lb weight loss per week and includes a negative energy balance of  [phone] kcal/d;Understanding recommendations for meals to include 15-35% energy as protein, 25-35% energy from fat, 35-60% energy from carbohydrates, less than 200mg  of dietary cholesterol, 20-35 gm of total fiber daily;Understanding of distribution of calorie intake throughout the day with the consumption of 4-5 meals/snacks    Hypertension Yes    Intervention Provide education on lifestyle modifcations including regular physical activity/exercise, weight management, moderate sodium restriction and increased consumption of fresh fruit, vegetables, and low fat dairy, alcohol  moderation, and smoking cessation.;Monitor prescription use compliance.    Expected Outcomes Short Term: Continued assessment and intervention until BP is < 140/58mm HG in hypertensive participants. < 130/24mm HG in hypertensive participants with diabetes, heart failure or chronic kidney disease.;Long Term: Maintenance of blood pressure  at goal levels.    Lipids Yes    Intervention Provide education and support for participant on nutrition & aerobic/resistive exercise along with prescribed medications to achieve LDL 70mg , HDL >40mg .    Expected Outcomes Short Term: Participant states understanding of desired cholesterol values and is compliant with medications prescribed. Participant is following exercise prescription and nutrition guidelines.;Long Term: Cholesterol controlled with medications as prescribed, with individualized exercise RX and with personalized nutrition plan. Value goals: LDL < 70mg , HDL > 40 mg.          Education:Diabetes - Individual verbal and written instruction to review signs/symptoms of diabetes, desired ranges of glucose level fasting, after meals and with exercise. Acknowledge that pre and post exercise glucose checks will be done for 3 sessions at entry of program.   Core Components/Risk Factors/Patient Goals Review:   Goals and Risk Factor Review     Row Name 10/28/23 1510 12/04/23 1405           Core Components/Risk Factors/Patient Goals Review   Personal Goals Review Weight Management/Obesity;Hypertension Weight Management/Obesity;Lipids;Hypertension      Review Markis states he is monitoring his blood pressure at home with his wrist monitor and is getting similar readings as he does in rehab. He states he is checking his weight daily and has a goal to get down to 190lb. He knows it will take discipline and be hard. He has switched to drinking water and will occasionally have a soft drink. He also started to split meals with his girlfriend when they go out to help with portion size. Jervis reports that he checks his weight and blood pressure at home. He reports that his weight is up and down slightly but mostly stays steady. She reports that he takes all her BP and cholesterol meds and follows up with his docotor for appointments and lab work. He wants to continue to work on weight loss with  a goal of 190 lbs.      Expected Outcomes Short: Cotinue monitoring weight and blood pressure at home. Long: Continue managing cardiovascular risk factors. Short: contine to work towards goal weight of 190 lbs. Continue to check BP at home. Long: control cardiac risk factors.         Core Components/Risk Factors/Patient Goals at Discharge (Final Review):   Goals and Risk Factor Review - 12/04/23 1405       Core Components/Risk Factors/Patient Goals Review   Personal Goals Review Weight Management/Obesity;Lipids;Hypertension    Review Paola reports that he checks his weight and blood pressure at home. He reports that his weight is up and down slightly but mostly stays steady. She reports that he takes all her BP and cholesterol meds and follows up with his docotor for appointments and lab  work. He wants to continue to work on weight loss with a goal of 190 lbs.    Expected Outcomes Short: contine to work towards goal weight of 190 lbs. Continue to check BP at home. Long: control cardiac risk factors.          ITP Comments:  ITP Comments     Row Name 09/06/23 1012 09/17/23 1510 09/23/23 1406 10/02/23 0955 10/30/23 1439   ITP Comments Virtual Visit completed. Patient informed on EP and RD appointment and 6 Minute walk test. Patient also informed of patient health questionnaires on My Chart. Patient Verbalizes understanding. Visit diagnosis can be found in [facility] 4//29/2025. Completed and gym orientation for cardiac rehab. Initial ITP created and sent for review to Dr. Oneil Pinal, Medical Director. First full day of exercise!  Patient was oriented to gym and equipment including functions, settings, policies, and procedures.  Patient's individual exercise prescription and treatment plan were reviewed.  All starting workloads were established based on the results of the 6 minute walk test done at initial orientation visit.  The plan for exercise progression was also introduced and progression  will be customized based on patient's performance and goals. 30 Day review completed. Medical Director ITP review done; changes made as directed and signed approval by Medical Director. New to program. 30 Day review completed. Medical Director ITP review done; changes made as directed and signed approval by Medical Director.    Row Name 11/27/23 0946 12/25/23 1257         ITP Comments 30 Day review completed. Medical Director ITP review done; changes made as directed and signed approval by Medical Director. 30 Day review completed. Medical Director ITP review done, changes made as directed, and signed approval by Medical Director.         Comments: 30 day ITP review

## 2023-12-30 ENCOUNTER — Encounter: Admitting: Emergency Medicine

## 2023-12-30 DIAGNOSIS — Z955 Presence of coronary angioplasty implant and graft: Secondary | ICD-10-CM | POA: Diagnosis not present

## 2023-12-30 DIAGNOSIS — I214 Non-ST elevation (NSTEMI) myocardial infarction: Secondary | ICD-10-CM

## 2023-12-30 NOTE — Progress Notes (Signed)
 Daily Session Note  Patient Details  Name: Theodore Wiggins MRN: [id number] Date of Birth: April 09, 1945 Referring Provider:   Flowsheet Row Cardiac Rehab from 09/17/2023 in [facility] Cardiac and Pulmonary Rehab  Referring Provider Dr. Keller Paterson    Encounter Date: 12/30/2023  Check In:  Session Check In - 12/30/23 1353       Check-In   Supervising physician immediately available to respond to emergencies See telemetry face sheet for immediately available ER MD    Location [facility]-Cardiac & Pulmonary Rehab    Staff Present Leita Franks RN,BSN;Maxon Burnell BS, Exercise Physiologist;Margaret Best, MS, Exercise Physiologist;Noah Tickle, BS, Exercise Physiologist    Virtual Visit No    Medication changes reported     No    Fall or balance concerns reported    No    Tobacco Cessation No Change    Warm-up and Cool-down Performed on first and last piece of equipment    Resistance Training Performed Yes    VAD Patient? No    PAD/SET Patient? No      Pain Assessment   Currently in Pain? No/denies             Social History   Tobacco Use  Smoking Status Never  Smokeless Tobacco Current   Types: Snuff    Goals Met:  Independence with exercise equipment Exercise tolerated well No report of concerns or symptoms today Strength training completed today  Goals Unmet:  Not Applicable  Comments: Pt able to follow exercise prescription today without complaint.  Will continue to monitor for progression.    Dr. Oneil Pinal is Medical Director for [facility] Cardiac Rehabilitation.  Dr. Fuad Aleskerov is Medical Director for [facility] Pulmonary Rehabilitation.

## 2024-01-01 ENCOUNTER — Encounter: Admitting: Emergency Medicine

## 2024-01-01 DIAGNOSIS — Z955 Presence of coronary angioplasty implant and graft: Secondary | ICD-10-CM | POA: Diagnosis not present

## 2024-01-01 DIAGNOSIS — I214 Non-ST elevation (NSTEMI) myocardial infarction: Secondary | ICD-10-CM

## 2024-01-01 NOTE — Progress Notes (Signed)
 Daily Session Note  Patient Details  Name: Theodore Wiggins MRN: [id number] Date of Birth: [birth year] Referring Provider:   Flowsheet Row Cardiac Rehab from 09/17/2023 in [facility] Cardiac and Pulmonary Rehab  Referring Provider Dr. Keller Paterson    Encounter Date: 01/01/2024  Check In:  Session Check In - 01/01/24 1355       Check-In   Supervising physician immediately available to respond to emergencies See telemetry face sheet for immediately available ER MD    Location [facility]-Cardiac & Pulmonary Rehab    Staff Present Leita Franks RN,BSN;Meredith Tressa RN,BSN;Margaret Best, MS, Exercise Physiologist;Noah Tickle, BS, Exercise Physiologist    Virtual Visit No    Medication changes reported     Yes    Comments changed from entresto to generic    Fall or balance concerns reported    No    Tobacco Cessation No Change    Warm-up and Cool-down Performed on first and last piece of equipment    Resistance Training Performed Yes    VAD Patient? No    PAD/SET Patient? No      Pain Assessment   Currently in Pain? No/denies             Social History   Tobacco Use  Smoking Status Never  Smokeless Tobacco Current   Types: Snuff    Goals Met:  Independence with exercise equipment Exercise tolerated well No report of concerns or symptoms today Strength training completed today  Goals Unmet:  Not Applicable  Comments: Pt able to follow exercise prescription today without complaint.  Will continue to monitor for progression.    Dr. Oneil Pinal is Medical Director for [facility] Cardiac Rehabilitation.  Dr. Fuad Aleskerov is Medical Director for [facility] Pulmonary Rehabilitation.

## 2024-01-06 ENCOUNTER — Encounter: Admitting: Emergency Medicine

## 2024-01-06 DIAGNOSIS — I214 Non-ST elevation (NSTEMI) myocardial infarction: Secondary | ICD-10-CM | POA: Insufficient documentation

## 2024-01-06 DIAGNOSIS — Z955 Presence of coronary angioplasty implant and graft: Secondary | ICD-10-CM | POA: Insufficient documentation

## 2024-01-06 NOTE — Progress Notes (Signed)
 Daily Session Note  Patient Details  Name: Theodore Wiggins MRN: [id number] Date of Birth: 07-21-1945 Referring Provider:   Flowsheet Row Cardiac Rehab from 09/17/2023 in [facility] Cardiac and Pulmonary Rehab  Referring Provider Dr. Keller Paterson    Encounter Date: 01/06/2024  Check In:  Session Check In - 01/06/24 1405       Check-In   Supervising physician immediately available to respond to emergencies See telemetry face sheet for immediately available ER MD    Location [facility]-Cardiac & Pulmonary Rehab    Staff Present Leita Franks RN,BSN;Maxon Burnell BS, Exercise Physiologist;Noah Tickle, BS, Exercise Physiologist;Margaret Best, MS, Exercise Physiologist;Meredith Tressa RN,BSN    Virtual Visit No    Medication changes reported     No    Fall or balance concerns reported    No    Tobacco Cessation No Change    Warm-up and Cool-down Performed on first and last piece of equipment    Resistance Training Performed Yes    VAD Patient? No    PAD/SET Patient? No      Pain Assessment   Currently in Pain? No/denies             Social History   Tobacco Use  Smoking Status Never  Smokeless Tobacco Current   Types: Snuff    Goals Met:  Independence with exercise equipment Exercise tolerated well No report of concerns or symptoms today Strength training completed today  Goals Unmet:  Not Applicable  Comments: Pt able to follow exercise prescription today without complaint.  Will continue to monitor for progression.    Dr. Oneil Pinal is Medical Director for [facility] Cardiac Rehabilitation.  Dr. Fuad Aleskerov is Medical Director for [facility] Pulmonary Rehabilitation.

## 2024-01-08 ENCOUNTER — Encounter

## 2024-01-08 DIAGNOSIS — Z955 Presence of coronary angioplasty implant and graft: Secondary | ICD-10-CM

## 2024-01-08 DIAGNOSIS — I214 Non-ST elevation (NSTEMI) myocardial infarction: Secondary | ICD-10-CM

## 2024-01-08 NOTE — Progress Notes (Signed)
 Daily Session Note  Patient Details  Name: Theodore Wiggins MRN: [id number] Date of Birth: 1945/06/29 Referring Provider:   Flowsheet Row Cardiac Rehab from 09/17/2023 in [facility] Cardiac and Pulmonary Rehab  Referring Provider Dr. Keller Paterson    Encounter Date: 01/08/2024  Check In:  Session Check In - 01/08/24 1349       Check-In   Supervising physician immediately available to respond to emergencies See telemetry face sheet for immediately available ER MD    Location [facility]-Cardiac & Pulmonary Rehab    Staff Present Burnard Davenport RN,BSN,MPA;Laura Cates RN,BSN;Noah Laird, BS, Exercise Physiologist;Markevius Trombetta Dyane HECKLE, ACSM CEP, Exercise Physiologist    Virtual Visit No    Medication changes reported     No    Fall or balance concerns reported    No    Tobacco Cessation No Change    Warm-up and Cool-down Performed on first and last piece of equipment    Resistance Training Performed Yes    VAD Patient? No    PAD/SET Patient? No      Pain Assessment   Currently in Pain? No/denies             Social History   Tobacco Use  Smoking Status Never  Smokeless Tobacco Current   Types: Snuff    Goals Met:  Proper associated with RPD/PD & O2 Sat Independence with exercise equipment Exercise tolerated well No report of concerns or symptoms today Strength training completed today  Goals Unmet:  Not Applicable  Comments: Pt able to follow exercise prescription today without complaint.  Will continue to monitor for progression.    Dr. Oneil Pinal is Medical Director for [facility] Cardiac Rehabilitation.  Dr. Fuad Aleskerov is Medical Director for [facility] Pulmonary Rehabilitation.

## 2024-01-13 ENCOUNTER — Encounter: Admitting: Emergency Medicine

## 2024-01-13 DIAGNOSIS — I214 Non-ST elevation (NSTEMI) myocardial infarction: Secondary | ICD-10-CM | POA: Diagnosis not present

## 2024-01-13 DIAGNOSIS — Z955 Presence of coronary angioplasty implant and graft: Secondary | ICD-10-CM

## 2024-01-13 NOTE — Progress Notes (Signed)
 Daily Session Note  Patient Details  Name: Theodore Wiggins MRN: [id number] Date of Birth: 1945-09-17 Referring Provider:   Flowsheet Row Cardiac Rehab from 09/17/2023 in [facility] Cardiac and Pulmonary Rehab  Referring Provider Dr. Keller Paterson    Encounter Date: 01/13/2024  Check In:  Session Check In - 01/13/24 1354       Check-In   Supervising physician immediately available to respond to emergencies See telemetry face sheet for immediately available ER MD    Location [facility]-Cardiac & Pulmonary Rehab    Staff Present Leita Franks RN,BSN;Maxon Conetta BS, Exercise Physiologist;Noah Tickle, BS, Exercise Physiologist;Margaret Best, MS, Exercise Physiologist    Virtual Visit No    Medication changes reported     No    Fall or balance concerns reported    No    Tobacco Cessation No Change    Warm-up and Cool-down Performed on first and last piece of equipment    Resistance Training Performed Yes    VAD Patient? No    PAD/SET Patient? No      Pain Assessment   Currently in Pain? No/denies             Social History   Tobacco Use  Smoking Status Never  Smokeless Tobacco Current   Types: Snuff    Goals Met:  Independence with exercise equipment Exercise tolerated well No report of concerns or symptoms today Strength training completed today  Goals Unmet:  Not Applicable  Comments: Pt able to follow exercise prescription today without complaint.  Will continue to monitor for progression.    Dr. Oneil Pinal is Medical Director for [facility] Cardiac Rehabilitation.  Dr. Fuad Aleskerov is Medical Director for [facility] Pulmonary Rehabilitation.

## 2024-01-15 ENCOUNTER — Encounter: Admitting: Emergency Medicine

## 2024-01-15 DIAGNOSIS — I214 Non-ST elevation (NSTEMI) myocardial infarction: Secondary | ICD-10-CM

## 2024-01-15 DIAGNOSIS — Z955 Presence of coronary angioplasty implant and graft: Secondary | ICD-10-CM

## 2024-01-15 NOTE — Progress Notes (Signed)
 Daily Session Note  Patient Details  Name: Theodore Wiggins MRN: [id number] Date of Birth: June 01, 1945 Referring Provider:   Flowsheet Row Cardiac Rehab from 09/17/2023 in [facility] Cardiac and Pulmonary Rehab  Referring Provider Dr. Keller Paterson    Encounter Date: 01/15/2024  Check In:  Session Check In - 01/15/24 1352       Check-In   Supervising physician immediately available to respond to emergencies See telemetry face sheet for immediately available ER MD    Location [facility]-Cardiac & Pulmonary Rehab    Staff Present Leita Franks RN,BSN;Noah Tickle, BS, Exercise Physiologist;Kelly Dyane HECKLE, ACSM CEP, Exercise Physiologist;Kelly Bollinger [facility]    Virtual Visit No    Medication changes reported     No    Fall or balance concerns reported    No    Tobacco Cessation No Change    Warm-up and Cool-down Performed on first and last piece of equipment    Resistance Training Performed Yes    VAD Patient? No    PAD/SET Patient? No      Pain Assessment   Currently in Pain? No/denies             Social History   Tobacco Use  Smoking Status Never  Smokeless Tobacco Current   Types: Snuff    Goals Met:  Independence with exercise equipment Exercise tolerated well No report of concerns or symptoms today Strength training completed today  Goals Unmet:  Not Applicable  Comments: Pt able to follow exercise prescription today without complaint.  Will continue to monitor for progression.    Dr. Oneil Pinal is Medical Director for [facility] Cardiac Rehabilitation.  Dr. Fuad Aleskerov is Medical Director for [facility] Pulmonary Rehabilitation.

## 2024-01-20 ENCOUNTER — Encounter: Admitting: Emergency Medicine

## 2024-01-20 DIAGNOSIS — I214 Non-ST elevation (NSTEMI) myocardial infarction: Secondary | ICD-10-CM | POA: Diagnosis not present

## 2024-01-20 DIAGNOSIS — Z955 Presence of coronary angioplasty implant and graft: Secondary | ICD-10-CM

## 2024-01-20 NOTE — Progress Notes (Signed)
 Daily Session Note  Patient Details  Name: Theodore Wiggins MRN: [id number] Date of Birth: 1945-03-31 Referring Provider:   Flowsheet Row Cardiac Rehab from 09/17/2023 in [facility] Cardiac and Pulmonary Rehab  Referring Provider Dr. Keller Paterson    Encounter Date: 01/20/2024  Check In:  Session Check In - 01/20/24 1407       Check-In   Supervising physician immediately available to respond to emergencies See telemetry face sheet for immediately available ER MD    Location [facility]-Cardiac & Pulmonary Rehab    Staff Present Maxon Conetta BS, Exercise Physiologist;Shontae Rosiles RN,BSN;Noah Tickle, BS, Exercise Physiologist;Jason Elnor RDN,LDN    Virtual Visit No    Medication changes reported     No    Fall or balance concerns reported    No    Tobacco Cessation No Change    Warm-up and Cool-down Performed on first and last piece of equipment    Resistance Training Performed Yes    VAD Patient? No    PAD/SET Patient? No      Pain Assessment   Currently in Pain? No/denies             Tobacco Use History[1]  Goals Met:  Independence with exercise equipment Exercise tolerated well No report of concerns or symptoms today Strength training completed today  Goals Unmet:  Not Applicable  Comments: Pt able to follow exercise prescription today without complaint.  Will continue to monitor for progression.    Dr. Oneil Pinal is Medical Director for [facility] Cardiac Rehabilitation.  Dr. Fuad Aleskerov is Medical Director for [facility] Pulmonary Rehabilitation.    [1]  Social History Tobacco Use  Smoking Status Never  Smokeless Tobacco Current   Types: Snuff

## 2024-01-22 ENCOUNTER — Encounter

## 2024-01-22 DIAGNOSIS — Z955 Presence of coronary angioplasty implant and graft: Secondary | ICD-10-CM

## 2024-01-22 DIAGNOSIS — I214 Non-ST elevation (NSTEMI) myocardial infarction: Secondary | ICD-10-CM

## 2024-01-22 NOTE — Progress Notes (Signed)
 Daily Session Note  Patient Details  Name: Theodore Wiggins MRN: [id number] Date of Birth: [birth year] Referring Provider:   Flowsheet Row Cardiac Rehab from 09/17/2023 in [facility] Cardiac and Pulmonary Rehab  Referring Provider Dr. Keller Paterson    Encounter Date: 01/22/2024  Check In:  Session Check In - 01/22/24 1407       Check-In   Supervising physician immediately available to respond to emergencies See telemetry face sheet for immediately available ER MD    Location [facility]-Cardiac & Pulmonary Rehab    Staff Present Leita Franks RN,BSN;Noah Tickle, BS, Exercise Physiologist;Kelly Dyane HECKLE, ACSM CEP, Exercise Physiologist;Kelly Bollinger [facility]    Virtual Visit No    Medication changes reported     No    Fall or balance concerns reported    No    Tobacco Cessation No Change    Warm-up and Cool-down Performed on first and last piece of equipment    Resistance Training Performed Yes    VAD Patient? No    PAD/SET Patient? No      Pain Assessment   Currently in Pain? No/denies             Tobacco Use History[1]  Goals Met:  Independence with exercise equipment Exercise tolerated well No report of concerns or symptoms today Strength training completed today  Goals Unmet:  Not Applicable  Comments: Pt able to follow exercise prescription today without complaint.  Will continue to monitor for progression.    Dr. Oneil Pinal is Medical Director for [facility] Cardiac Rehabilitation.  Dr. Fuad Aleskerov is Medical Director for [facility] Pulmonary Rehabilitation.    [1]  Social History Tobacco Use  Smoking Status Never  Smokeless Tobacco Current   Types: Snuff

## 2024-01-22 NOTE — Progress Notes (Signed)
 Cardiac Individual Treatment Plan  Patient Details  Name: Theodore Wiggins MRN: [id number] Date of Birth: [birth year] Referring Provider:   Flowsheet Row Cardiac Rehab from 09/17/2023 in [facility] Cardiac and Pulmonary Rehab  Referring Provider Dr. Keller Paterson    Initial Encounter Date:  Flowsheet Row Cardiac Rehab from 09/17/2023 in [facility] Cardiac and Pulmonary Rehab  Date 09/17/23    Visit Diagnosis: NSTEMI (non-ST elevated myocardial infarction) [facility])  Status post coronary artery stent placement  Patient's Home Medications on Admission: Current Medications[1]  Past Medical History: Past Medical History:  Diagnosis Date   Arthritis    Coronary artery disease    DDD (degenerative disc disease), lumbar    DDD (degenerative disc disease), lumbar    Depression    Dyspnea    GERD (gastroesophageal reflux disease)    Hyperlipidemia    Hypertension    NSTEMI (non-ST elevated myocardial infarction) (HCC) 06/04/2023   Post herpetic neuralgia    Vertigo     Tobacco Use: Tobacco Use History[2]  Labs: Review Flowsheet       Latest Ref Rng & Units 10/01/2017 04/29/2020 06/04/2023  Labs for ITP Cardiac and Pulmonary Rehab  Cholestrol 0 - 200 mg/dL - - 885   LDL (calc) 0 - 99 mg/dL - - 63   HDL-C >59 mg/dL - - 34   Trlycerides <849 mg/dL - - 84   Hemoglobin J8r 4.8 - 5.6 % 5.5  - 5.5   O2 Saturation % - 97.1  -     Exercise Target Goals: Exercise Program Goal: Individual exercise prescription set using results from initial 6 min walk test and THRR while considering  patients activity barriers and safety.   Exercise Prescription Goal: Initial exercise prescription builds to 30-45 minutes a day of aerobic activity, 2-3 days per week.  Home exercise guidelines will be given to patient during program as part of exercise prescription that the participant will acknowledge.   Education: Aerobic Exercise: - Group verbal and visual presentation on the components of exercise  prescription. Introduces F.I.T.T principle from ACSM for exercise prescriptions.  Reviews F.I.T.T. principles of aerobic exercise including progression. Written material provided at class time. Flowsheet Row Cardiac Rehab from 12/04/2023 in [facility] Cardiac and Pulmonary Rehab  Education need identified 09/17/23  Date 11/20/23  Educator mb  Instruction Review Code 1- Verbalizes Understanding    Education: Resistance Exercise: - Group verbal and visual presentation on the components of exercise prescription. Introduces F.I.T.T principle from ACSM for exercise prescriptions  Reviews F.I.T.T. principles of resistance exercise including progression. Written material provided at class time.    Education: Exercise & Equipment Safety: - Individual verbal instruction and demonstration of equipment use and safety with use of the equipment. Flowsheet Row Cardiac Rehab from 12/04/2023 in [facility] Cardiac and Pulmonary Rehab  Date 09/17/23  Educator [facility]  Instruction Review Code 1- Verbalizes Understanding    Education: Exercise Physiology & General Exercise Guidelines: - Group verbal and written instruction with models to review the exercise physiology of the cardiovascular system and associated critical values. Provides general exercise guidelines with specific guidelines to those with heart or lung disease. Written material provided at class time. Flowsheet Row Cardiac Rehab from 12/04/2023 in [facility] Cardiac and Pulmonary Rehab  Education need identified 09/17/23    Education: Flexibility, Balance, Mind/Body Relaxation: - Group verbal and visual presentation with interactive activity on the components of exercise prescription. Introduces F.I.T.T principle from ACSM for exercise prescriptions. Reviews F.I.T.T. principles of flexibility and balance exercise training  including progression. Also discusses the mind body connection.  Reviews various relaxation techniques to help reduce and manage stress (i.e. Deep  breathing, progressive muscle relaxation, and visualization). Balance handout provided to take home. Written material provided at class time.   Activity Barriers & Risk Stratification:   6 Minute Walk:  6 Minute Walk     Row Name 09/17/23 1511         6 Minute Walk   Phase Initial     Distance 920 feet     Walk Time 6 minutes     # of Rest Breaks 0     MPH 1.74     METS 1.5     RPE 13     Perceived Dyspnea  2.5     VO2 Peak 5.25     Symptoms Yes (comment)     Comments SOB     Resting HR 62 bpm     Resting BP 122/70     Resting Oxygen Saturation  97 %     Exercise Oxygen Saturation  during 6 min walk 100 %     Max Ex. HR 75 bpm     Max Ex. BP 138/64     2 Minute Post BP 116/66        Oxygen Initial Assessment:   Oxygen Re-Evaluation:   Oxygen Discharge (Final Oxygen Re-Evaluation):   Initial Exercise Prescription:  Initial Exercise Prescription - 09/17/23 1500       Date of Initial Exercise RX and Referring Provider   Date 09/17/23    Referring Provider Dr. Keller Paterson      Oxygen   Maintain Oxygen Saturation 88% or higher      Treadmill   MPH 1.7    Grade 0    Minutes 15    METs 2.3      Recumbant Bike   Level 2    RPM 50    Watts 25    Minutes 15    METs 1.5      T5 Nustep   Level 2    SPM 80    Minutes 15    METs 1.5      Track   Laps 24    Minutes 15    METs 2.31      Prescription Details   Duration Progress to 30 minutes of continuous aerobic without signs/symptoms of physical distress      Intensity   THRR 40-80% of Max Heartrate 94-126    Ratings of Perceived Exertion 11-13    Perceived Dyspnea 0-4      Progression   Progression Continue to progress workloads to maintain intensity without signs/symptoms of physical distress.      Resistance Training   Training Prescription Yes    Weight 4lb    Reps 10-15          Perform Capillary Blood Glucose checks as needed.  Exercise Prescription Changes:   Exercise  Prescription Changes     Row Name 09/17/23 1500 10/09/23 1500 10/24/23 1300 11/06/23 0800 11/27/23 1400     Response to Exercise   Blood Pressure (Admit) 122/70 142/80 124/82 130/84 --   Blood Pressure (Exercise) 138/64 146/86 120/80 134/78 --   Blood Pressure (Exit) 116/66 120/64 110/62 122/66 --   Heart Rate (Admit) 62 bpm 67 bpm 77 bpm 76 bpm --   Heart Rate (Exercise) 75 bpm 95 bpm 90 bpm 100 bpm --   Heart Rate (Exit) 61 bpm 76 bpm 74 bpm  76 bpm --   Oxygen Saturation (Admit) 97 % -- -- -- --   Oxygen Saturation (Exercise) 100 % -- -- -- --   Oxygen Saturation (Exit) 100 % -- -- -- --   Rating of Perceived Exertion (Exercise) 13 13 15 15  --   Perceived Dyspnea (Exercise) 2.5 -- -- -- --   Symptoms SOB none none none --   Comments results 1st 2 weeks of exercise -- -- --   Duration -- Progress to 30 minutes of  aerobic without signs/symptoms of physical distress Continue with 30 min of aerobic exercise without signs/symptoms of physical distress. Continue with 30 min of aerobic exercise without signs/symptoms of physical distress. Continue with 30 min of aerobic exercise without signs/symptoms of physical distress.   Intensity -- THRR unchanged THRR unchanged THRR unchanged THRR unchanged     Progression   Progression -- Continue to progress workloads to maintain intensity without signs/symptoms of physical distress. Continue to progress workloads to maintain intensity without signs/symptoms of physical distress. Continue to progress workloads to maintain intensity without signs/symptoms of physical distress. Continue to progress workloads to maintain intensity without signs/symptoms of physical distress.   Average METs -- 2.24 2.25 2.62 2.62     Resistance Training   Training Prescription -- Yes Yes Yes Yes   Weight -- 4lb 4 lb 4 lb 4 lb   Reps -- 10-15 10-15 10-15 10-15     Interval Training   Interval Training -- No No No No     Oxygen   Oxygen -- Continuous -- -- --      Treadmill   MPH -- 2 1.7 2 2    Grade -- 0 0 0 0   Minutes -- 15 15 15 15    METs -- 2.53 2.3 2.53 2.53     Recumbant Bike   Level -- 3 -- -- --   Watts -- 25 -- -- --   Minutes -- 15 -- -- --   METs -- 2.83 -- -- --     T5 Nustep   Level -- 5 5 5 5    Minutes -- 15 15 15 15    METs -- 2 2.2 2.4 2.4     Biostep-RELP   Level -- -- -- 7 7   Minutes -- -- -- 15 15   METs -- -- -- 3 3     Home Exercise Plan   Plans to continue exercise at -- -- -- -- Home (comment)  TM, rower, walking   Frequency -- -- -- -- Add 2 additional days to program exercise sessions.   Initial Home Exercises Provided -- -- -- -- 11/27/23     Oxygen   Maintain Oxygen Saturation -- 88% or higher 88% or higher 88% or higher 88% or higher    Row Name 12/04/23 1600 12/18/23 0800 12/30/23 1100 01/15/24 1100       Response to Exercise   Blood Pressure (Admit) 120/78 130/72 132/84 150/82    Blood Pressure (Exercise) 148/80 -- -- --    Blood Pressure (Exit) 122/70 130/72 128/80 122/72    Heart Rate (Admit) 60 bpm 81 bpm 76 bpm 71 bpm    Heart Rate (Exercise) 101 bpm 104 bpm 95 bpm 92 bpm    Heart Rate (Exit) 88 bpm 72 bpm 75 bpm 76 bpm    Oxygen Saturation (Admit) -- -- 98 % 98 %    Oxygen Saturation (Exercise) -- -- 95 % 96 %    Oxygen  Saturation (Exit) -- -- 94 % 98 %    Rating of Perceived Exertion (Exercise) 15 15 15 15     Perceived Dyspnea (Exercise) -- -- -- 3    Symptoms none none none none    Duration Continue with 30 min of aerobic exercise without signs/symptoms of physical distress. Continue with 30 min of aerobic exercise without signs/symptoms of physical distress. Continue with 30 min of aerobic exercise without signs/symptoms of physical distress. Continue with 30 min of aerobic exercise without signs/symptoms of physical distress.    Intensity THRR unchanged THRR unchanged THRR unchanged THRR unchanged      Progression   Progression Continue to progress workloads to maintain intensity  without signs/symptoms of physical distress. Continue to progress workloads to maintain intensity without signs/symptoms of physical distress. Continue to progress workloads to maintain intensity without signs/symptoms of physical distress. Continue to progress workloads to maintain intensity without signs/symptoms of physical distress.    Average METs 2.99 3.22 3.42 3.29      Resistance Training   Training Prescription Yes Yes Yes Yes    Weight 4 lb 4 lb 4 lb 4 lb    Reps 10-15 10-15 10-15 10-15      Interval Training   Interval Training No No No No      Treadmill   MPH 2 2 2  1.8    Grade 3 4 4 4     Minutes 15 15 15 15     METs 3.36 3.63 3.63 3.37      NuStep   Level 5  T6 -- -- 7    SPM 80 -- -- --    Minutes 15 -- -- 15    METs 2.3 -- -- 4      T5 Nustep   Level 3 4 4  --    Minutes 15 15 15  --    METs 2.2 2.4 -- --      Biostep-RELP   Level 8 5 5 6     Minutes 15 15 15 15     METs 4 4 4 2       Home Exercise Plan   Plans to continue exercise at Home (comment)  TM, rower, walking Home (comment)  TM, rower, walking Home (comment)  TM, rower, walking Home (comment)  TM, rower, walking    Frequency Add 2 additional days to program exercise sessions. Add 2 additional days to program exercise sessions. Add 2 additional days to program exercise sessions. Add 2 additional days to program exercise sessions.    Initial Home Exercises Provided 11/27/23 11/27/23 11/27/23 11/27/23      Oxygen   Maintain Oxygen Saturation 88% or higher 88% or higher 88% or higher 88% or higher       Exercise Comments:   Exercise Comments     Row Name 09/23/23 1406           Exercise Comments First full day of exercise!  Patient was oriented to gym and equipment including functions, settings, policies, and procedures.  Patient's individual exercise prescription and treatment plan were reviewed.  All starting workloads were established based on the results of the 6 minute walk test done at initial  orientation visit.  The plan for exercise progression was also introduced and progression will be customized based on patient's performance and goals.          Exercise Goals and Review:   Exercise Goals     Row Name 09/17/23 1515  Exercise Goals   Increase Physical Activity Yes       Intervention Provide advice, education, support and counseling about physical activity/exercise needs.;Develop an individualized exercise prescription for aerobic and resistive training based on initial evaluation findings, risk stratification, comorbidities and participant's personal goals.       Expected Outcomes Short Term: Attend rehab on a regular basis to increase amount of physical activity.;Long Term: Add in home exercise to make exercise part of routine and to increase amount of physical activity.;Long Term: Exercising regularly at least 3-5 days a week.       Increase Strength and Stamina Yes       Intervention Provide advice, education, support and counseling about physical activity/exercise needs.;Develop an individualized exercise prescription for aerobic and resistive training based on initial evaluation findings, risk stratification, comorbidities and participant's personal goals.       Expected Outcomes Short Term: Increase workloads from initial exercise prescription for resistance, speed, and METs.;Short Term: Perform resistance training exercises routinely during rehab and add in resistance training at home;Long Term: Improve cardiorespiratory fitness, muscular endurance and strength as measured by increased METs and functional capacity ( )       Able to understand and use rate of perceived exertion (RPE) scale Yes       Intervention Provide education and explanation on how to use RPE scale       Expected Outcomes Short Term: Able to use RPE daily in rehab to express subjective intensity level;Long Term:  Able to use RPE to guide intensity level when exercising independently        Able to understand and use Dyspnea scale Yes       Intervention Provide education and explanation on how to use Dyspnea scale       Expected Outcomes Short Term: Able to use Dyspnea scale daily in rehab to express subjective sense of shortness of breath during exertion;Long Term: Able to use Dyspnea scale to guide intensity level when exercising independently       Knowledge and understanding of Target Heart Rate Range (THRR) Yes       Intervention Provide education and explanation of THRR including how the numbers were predicted and where they are located for reference       Expected Outcomes Short Term: Able to use daily as guideline for intensity in rehab;Short Term: Able to state/look up THRR;Long Term: Able to use THRR to govern intensity when exercising independently       Able to check pulse independently Yes       Intervention Provide education and demonstration on how to check pulse in carotid and radial arteries.;Review the importance of being able to check your own pulse for safety during independent exercise       Expected Outcomes Short Term: Able to explain why pulse checking is important during independent exercise;Long Term: Able to check pulse independently and accurately       Understanding of Exercise Prescription Yes       Intervention Provide education, explanation, and written materials on patient's individual exercise prescription       Expected Outcomes Short Term: Able to explain program exercise prescription;Long Term: Able to explain home exercise prescription to exercise independently          Exercise Goals Re-Evaluation :  Exercise Goals Re-Evaluation     Row Name 09/23/23 1407 10/09/23 1538 10/24/23 1355 11/06/23 0825 11/21/23 1138     Exercise Goal Re-Evaluation   Exercise Goals Review Increase Physical Activity;Able to  understand and use rate of perceived exertion (RPE) scale;Knowledge and understanding of Target Heart Rate Range (THRR);Understanding of  Exercise Prescription;Increase Strength and Stamina;Able to understand and use Dyspnea scale;Able to check pulse independently Increase Physical Activity;Understanding of Exercise Prescription;Increase Strength and Stamina Increase Physical Activity;Understanding of Exercise Prescription;Increase Strength and Stamina Increase Physical Activity;Understanding of Exercise Prescription;Increase Strength and Stamina Increase Physical Activity;Understanding of Exercise Prescription;Increase Strength and Stamina   Comments Reviewed RPE and dyspnea scale, THR and program prescription with pt today.  Pt voiced understanding and was given a copy of goals to take home. Sequoyah is off to a good start in the program and completed his first 2 weeks of exercise sessions in this review. He worked at level 5 on the T5 nustep and level 3 on the recumbent bike. He had a workload on the treadmill of a speed of 2 mph and no incline. We will continue to monitor his progress in the program. [name] only attended two sessions since the last review. He continues to walk the treadmill but lowered his speed from 2 mph to 1.7 mph with no incline. He also has consistently worked at level 5 on the T5 nustep. We will continue to monitor his progress in the program. [name] only attended two sessions since the last review. He increased his treadmill workload back up to a speed of 2 mph with no incline. He also began using the biostep and did well at level 7. We will continue to monitor his progress in the program. Treyvonne did not attend rehab during the last review period due to sickness. He has since returned to exercise in the program. We will continue to monitor his progress in the program.   Expected Outcomes Short: Use RPE daily to regulate intensity.  Long: Follow program prescription in THR. Short: Continue to follow current exercise prescription. Long: Continue exercise to improve strength and stamina. Short: Attend rehab more  consistently. Long: Continue exercise to improve strength and stamina. Short: Attend rehab more consistently. Long: Continue exercise to improve strength and stamina. Short: Return to regular attendance in the program. Long: Continue exercise to improve strength and stamina.    Row Name 11/27/23 1403 12/04/23 1355 12/04/23 1626 12/18/23 0839 12/30/23 1139     Exercise Goal Re-Evaluation   Exercise Goals Review Increase Physical Activity;Increase Strength and Stamina;Able to understand and use rate of perceived exertion (RPE) scale;Able to understand and use Dyspnea scale;Knowledge and understanding of Target Heart Rate Range (THRR);Able to check pulse independently;Understanding of Exercise Prescription Increase Strength and Stamina;Understanding of Exercise Prescription Increase Strength and Stamina;Understanding of Exercise Prescription;Increase Physical Activity Increase Strength and Stamina;Understanding of Exercise Prescription;Increase Physical Activity Increase Strength and Stamina;Understanding of Exercise Prescription;Increase Physical Activity   Comments Reviewed home exercise with pt today.  Pt plans to use rower, treadmill, and walking outside for exercise.  Reviewed THR, pulse, RPE, sign and symptoms, pulse oximetery and when to call 911 or MD.  Also discussed weather considerations and indoor options.  Pt voiced understanding. 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 He has not yet added a day of structured exercise at home but continues to stay active. He was encouraged to start at least one day of home exericse. He does have equipment he can use at home. Demetrias is doing well in rehab. He was able to increase to level 8 on the biostep and worked at level 5 on the T6 nustep. He also increased  his workload on the treadmill to a speed of 2 mph and incline of 3%. We will continue to monitor his progress in the program. Jacaden is doing well in  rehab. He was able to increase back up to level 4 on the T5 nustep. He also increased his workload on the treadmill to an incline of 4% while maintaining his speed at 2 mph. We will continue to monitor his progress in the program. Flavio is doing well in rehab. He maintained a speed of 2 mph on the treadmill with 4% incline. He also maintained level 5 on the biostep and level 4 on the T5 nustep. We will continue to monitor his progress in the program.   Expected Outcomes Short: add 1-2 days a week of exercise at home on off days of rehab. Long: maintain independent exercise routine upon graduation from cardiac rehab. Short: continue to stay active at home and add 1-2 days a week of structured exercise at home on off days of rehab. Long: maintain independent exercise routine upon graduation from cardiac rehab. Short: Continue to progressively increase treadmill and biostep workload. Long: Continue exercise to improve strength and stamina. Short: Continue to progressively increase workloads. Long: Continue exercise to improve strength and stamina. Short: Continue to progressively increase treadmill and nustep workloads. Long: Continue exercise to improve strength and stamina.    Row Name 01/06/24 1458 01/15/24 1157           Exercise Goal Re-Evaluation   Exercise Goals Review Increase Strength and Stamina;Understanding of Exercise Prescription;Increase Physical Activity Increase Physical Activity;Understanding of Exercise Prescription;Increase Strength and Stamina      Comments Yorel states he has not had time to do his home exercise because he has been extremely busy especially with the holiday season. He does do a lot of walking in the activities that are keeping him busy. He has also gotten back to doing some yardwork. We discussed setting a goal of trying to exercise 1 day a week at home. Blanton continues to do well in rehab. He is due for his post in the next review period and hopes to improve.  He increased all of his workloads. He increased his workload on the treadmill to a speed of 1.48mph and 4% incline. He also increased to level 7 on the T4 nustep and level 6 on the biostep. We will continue to monitor his progress in the program.      Expected Outcomes Short: Exercise at home 1 day a week. Long: Continue to exercise independently. Short: Improve on post . Long: Continue to increase overall METs and stamina.         Discharge Exercise Prescription (Final Exercise Prescription Changes):  Exercise Prescription Changes - 01/15/24 1100       Response to Exercise   Blood Pressure (Admit) 150/82    Blood Pressure (Exit) 122/72    Heart Rate (Admit) 71 bpm    Heart Rate (Exercise) 92 bpm    Heart Rate (Exit) 76 bpm    Oxygen Saturation (Admit) 98 %    Oxygen Saturation (Exercise) 96 %    Oxygen Saturation (Exit) 98 %    Rating of Perceived Exertion (Exercise) 15    Perceived Dyspnea (Exercise) 3    Symptoms none    Duration Continue with 30 min of aerobic exercise without signs/symptoms of physical distress.    Intensity THRR unchanged      Progression   Progression Continue to progress workloads to maintain intensity without signs/symptoms  of physical distress.    Average METs 3.29      Resistance Training   Training Prescription Yes    Weight 4 lb    Reps 10-15      Interval Training   Interval Training No      Treadmill   MPH 1.8    Grade 4    Minutes 15    METs 3.37      NuStep   Level 7    Minutes 15    METs 4      Biostep-RELP   Level 6    Minutes 15    METs 2      Home Exercise Plan   Plans to continue exercise at Home (comment)   TM, rower, walking   Frequency Add 2 additional days to program exercise sessions.    Initial Home Exercises Provided 11/27/23      Oxygen   Maintain Oxygen Saturation 88% or higher          Nutrition:  Target Goals: Understanding of nutrition guidelines, daily intake of sodium 1500mg , cholesterol 200mg ,  calories 30% from fat and 7% or less from saturated fats, daily to have 5 or more servings of fruits and vegetables.  Education: Nutrition 1 -Group instruction provided by verbal, written material, interactive activities, discussions, models, and posters to present general guidelines for heart healthy nutrition including macronutrients, label reading, and promoting whole foods over processed counterparts. Education serves as pensions consultant of discussion of heart healthy eating for all. Written material provided at class time. Flowsheet Row Cardiac Rehab from 12/04/2023 in [facility] Cardiac and Pulmonary Rehab  Date 11/27/23  Educator jg  Instruction Review Code 1- Verbalizes Understanding     Education: Nutrition 2 -Group instruction provided by verbal, written material, interactive activities, discussions, models, and posters to present general guidelines for heart healthy nutrition including sodium, cholesterol, and saturated fat. Providing guidance of habit forming to improve blood pressure, cholesterol, and body weight. Written material provided at class time. Flowsheet Row Cardiac Rehab from 12/04/2023 in [facility] Cardiac and Pulmonary Rehab  Date 12/04/23  Educator jg  Instruction Review Code 1- Verbalizes Understanding      Biometrics:  Pre Biometrics - 09/17/23 1516       Pre Biometrics   Height 5' 10.1 (1.781 m)    Weight 208 lb 1.6 oz (94.4 kg)    Waist Circumference 43.5 inches    Hip Circumference 42.5 inches    Waist to Hip Ratio 1.02 %    BMI (Calculated) 29.76    Single Leg Stand 3.6 seconds           Nutrition Therapy Plan and Nutrition Goals:   Nutrition Assessments:  MEDIFICTS Score Key: >=70 Need to make dietary changes  40-70 Heart Healthy Diet <= 40 Therapeutic Level Cholesterol Diet  Flowsheet Row Cardiac Rehab from 09/17/2023 in [facility] Cardiac and Pulmonary Rehab  Picture Your Plate Total Score on Admission 56   Picture Your Plate Scores: <59 Unhealthy  dietary pattern with much room for improvement. 41-50 Dietary pattern unlikely to meet recommendations for good health and room for improvement. 51-60 More healthful dietary pattern, with some room for improvement.  >60 Healthy dietary pattern, although there may be some specific behaviors that could be improved.    Nutrition Goals Re-Evaluation:  Nutrition Goals Re-Evaluation     Row Name 10/28/23 1510 12/04/23 1357 01/13/24 1402         Goals   Nutrition Goal RD appointment deferred at  this time RD appointment deferred at this time Pt continues to defer a meeting with the RD at this time.        Nutrition Goals Discharge (Final Nutrition Goals Re-Evaluation):  Nutrition Goals Re-Evaluation - 01/13/24 1402       Goals   Nutrition Goal Pt continues to defer a meeting with the RD at this time.          Psychosocial: Target Goals: Acknowledge presence or absence of significant depression and/or stress, maximize coping skills, provide positive support system. Participant is able to verbalize types and ability to use techniques and skills needed for reducing stress and depression.   Education: Stress, Anxiety, and Depression - Group verbal and visual presentation to define topics covered.  Reviews how body is impacted by stress, anxiety, and depression.  Also discusses healthy ways to reduce stress and to treat/manage anxiety and depression. Written material provided at class time.   Education: Sleep Hygiene -Provides group verbal and written instruction about how sleep can affect your health.  Define sleep hygiene, discuss sleep cycles and impact of sleep habits. Review good sleep hygiene tips.   Initial Review & Psychosocial Screening:  Initial Psych Review & Screening - 09/06/23 1015       Initial Review   Current issues with None Identified      Family Dynamics   Good Support System? Yes    Comments He has two daughters, a son and a girlfriend for support. He states  no mental instability.      Barriers   Psychosocial barriers to participate in program There are no identifiable barriers or psychosocial needs.;The patient should benefit from training in stress management and relaxation.      Screening Interventions   Interventions Encouraged to exercise;To provide support and resources with identified psychosocial needs;Provide feedback about the scores to participant    Expected Outcomes Short Term goal: Utilizing psychosocial counselor, staff and physician to assist with identification of specific Stressors or current issues interfering with healing process. Setting desired goal for each stressor or current issue identified.;Long Term Goal: Stressors or current issues are controlled or eliminated.;Short Term goal: Identification and review with participant of any Quality of Life or Depression concerns found by scoring the questionnaire.;Long Term goal: The participant improves quality of Life and PHQ9 Scores as seen by post scores and/or verbalization of changes          Quality of Life Scores:   Quality of Life - 09/17/23 1521       Quality of Life   Select Quality of Life      Quality of Life Scores   Health/Function Pre 11 %    Socioeconomic Pre 24.75 %    Psych/Spiritual Pre 13.71 %    Family Pre 26.4 %    GLOBAL Pre 16.89 %         Scores of 19 and below usually indicate a poorer quality of life in these areas.  A difference of  2-3 points is a clinically meaningful difference.  A difference of 2-3 points in the total score of the Quality of Life Index has been associated with significant improvement in overall quality of life, self-image, physical symptoms, and general health in studies assessing change in quality of life.  PHQ-9: Review Flowsheet       10/28/2023 09/17/2023  Depression screen PHQ 2/9  Decreased Interest 0 1  Down, Depressed, Hopeless 0 1  PHQ - 2 Score 0 2  Altered sleeping 1  1  Tired, decreased energy 2 3  Change  in appetite 0 1  Feeling bad or failure about yourself  0 2  Trouble concentrating 0 0  Moving slowly or fidgety/restless 0 0  Suicidal thoughts 0 0  PHQ-9 Score 3  9   Difficult doing work/chores Not difficult at all Very difficult    Details       Data saved with a previous flowsheet row definition        Interpretation of Total Score  Total Score Depression Severity:  1-4 = Minimal depression, 5-9 = Mild depression, 10-14 = Moderate depression, 15-19 = Moderately severe depression, 20-27 = Severe depression   Psychosocial Evaluation and Intervention:  Psychosocial Evaluation - 09/06/23 1017       Psychosocial Evaluation & Interventions   Interventions Encouraged to exercise with the program and follow exercise prescription;Relaxation education;Stress management education    Comments He has two daughters, a son and a girlfriend for support. He states no mental instability.    Expected Outcomes Short: Start [facility] to help with mood. Long: Maintain a healthy mental state    Continue Psychosocial Services  Follow up required by staff          Psychosocial Re-Evaluation:  Psychosocial Re-Evaluation     Row Name 10/28/23 1507 12/04/23 1408 01/13/24 1403         Psychosocial Re-Evaluation   Current issues with None Identified Current Sleep Concerns Current Sleep Concerns;Current Stress Concerns     Comments Spyros states he does not have any current stressors. He states he does not sleep great because he gets up to go to the bathroom 2-3 times a night, but does sleep soundly. He states he has a strong support system of his girlfriend, 2 daughters, and son that he can easily look to for support. He states he manages his stressors well and often will turn everything off and get out his bible and read. His PHQ score was reassessed today and it went down to 3. Kyler states that he continues to have a strong support system and feels blessed with his family and friends. He  reports he is still not sleeping well and sometimes has low energy during the day. He was encouraged to talk to his doctor if this continues to be a concerns. Reports no concerns with stress, or mental health. Julia states that he has had some stress lately due to some friends passing away. He has been going to some christmas parties lately which he has enjoyed, but also has made him tired. He reports not sleeping well due to waking up throughout the night to use the restroom. He talked to his doctor about this issue and they instructed him not to drink too many fluids after dinner. He overall is doing well with his mental health.     Expected Outcomes Short: Continue to manage stressors that arise. Long: Continue to maintain a positive outlook on life. Short: continue to attend cardiac rehab for mental health benefits of exercise. Talk to doctor about sleep concerns.  Long: maintain good mental health routine. Short: Drink less after dinner to help with sleep concerns as instructed by the doctor.  Long: Maintain positive outlook.     Interventions Encouraged to attend Cardiac Rehabilitation for the exercise Encouraged to attend Cardiac Rehabilitation for the exercise Encouraged to attend Cardiac Rehabilitation for the exercise     Continue Psychosocial Services  Follow up required by staff Follow up required by staff Follow up  required by staff        Psychosocial Discharge (Final Psychosocial Re-Evaluation):  Psychosocial Re-Evaluation - 01/13/24 1403       Psychosocial Re-Evaluation   Current issues with Current Sleep Concerns;Current Stress Concerns    Comments Amonte states that he has had some stress lately due to some friends passing away. He has been going to some christmas parties lately which he has enjoyed, but also has made him tired. He reports not sleeping well due to waking up throughout the night to use the restroom. He talked to his doctor about this issue and they instructed him not  to drink too many fluids after dinner. He overall is doing well with his mental health.    Expected Outcomes Short: Drink less after dinner to help with sleep concerns as instructed by the doctor.  Long: Maintain positive outlook.    Interventions Encouraged to attend Cardiac Rehabilitation for the exercise    Continue Psychosocial Services  Follow up required by staff          Vocational Rehabilitation: Provide vocational rehab assistance to qualifying candidates.   Vocational Rehab Evaluation & Intervention:   Education: Education Goals: Education classes will be provided on a variety of topics geared toward better understanding of heart health and risk factor modification. Participant will state understanding/return demonstration of topics presented as noted by education test scores.  Learning Barriers/Preferences:  Learning Barriers/Preferences - 09/06/23 1014       Learning Barriers/Preferences   Learning Barriers None    Learning Preferences None          General Cardiac Education Topics:  AED/CPR: - Group verbal and written instruction with the use of models to demonstrate the basic use of the AED with the basic ABC's of resuscitation.   Test and Procedures: - Group verbal and visual presentation and models provide information about basic cardiac anatomy and function. Reviews the testing methods done to diagnose heart disease and the outcomes of the test results. Describes the treatment choices: Medical Management, Angioplasty, or Coronary Bypass Surgery for treating various heart conditions including Myocardial Infarction, Angina, Valve Disease, and Cardiac Arrhythmias. Written material provided at class time.   Medication Safety: - Group verbal and visual instruction to review commonly prescribed medications for heart and lung disease. Reviews the medication, class of the drug, and side effects. Includes the steps to properly store meds and maintain the prescription  regimen. Written material provided at class time.   Intimacy: - Group verbal instruction through game format to discuss how heart and lung disease can affect sexual intimacy. Written material provided at class time. Flowsheet Row Cardiac Rehab from 12/04/2023 in [facility] Cardiac and Pulmonary Rehab  Date 11/20/23  Educator mb  Instruction Review Code 1- Verbalizes Understanding    Know Your Numbers and Heart Failure: - Group verbal and visual instruction to discuss disease risk factors for cardiac and pulmonary disease and treatment options.  Reviews associated critical values for Overweight/Obesity, Hypertension, Cholesterol, and Diabetes.  Discusses basics of heart failure: signs/symptoms and treatments.  Introduces Heart Failure Zone chart for action plan for heart failure. Written material provided at class time. Flowsheet Row Cardiac Rehab from 12/04/2023 in [facility] Cardiac and Pulmonary Rehab  Date 10/16/23  Educator mc  Instruction Review Code 1- Verbalizes Understanding    Infection Prevention: - Provides verbal and written material to individual with discussion of infection control including proper hand washing and proper equipment cleaning during exercise session. Flowsheet Row Cardiac Rehab from 12/04/2023  in [facility] Cardiac and Pulmonary Rehab  Date 09/17/23  Educator [facility]  Instruction Review Code 1- Verbalizes Understanding    Falls Prevention: - Provides verbal and written material to individual with discussion of falls prevention and safety. Flowsheet Row Cardiac Rehab from 12/04/2023 in [facility] Cardiac and Pulmonary Rehab  Date 09/17/23  Educator [facility]  Instruction Review Code 1- Verbalizes Understanding    Other: -Provides group and verbal instruction on various topics (see comments)   Knowledge Questionnaire Score:  Knowledge Questionnaire Score - 09/17/23 1523       Knowledge Questionnaire Score   Pre Score 26          Core Components/Risk Factors/Patient Goals at  Admission:  Personal Goals and Risk Factors at Admission - 09/06/23 1014       Core Components/Risk Factors/Patient Goals on Admission    Weight Management Yes;Weight Loss    Intervention Weight Management: Develop a combined nutrition and exercise program designed to reach desired caloric intake, while maintaining appropriate intake of nutrient and fiber, sodium and fats, and appropriate energy expenditure required for the weight goal.;Weight Management: Provide education and appropriate resources to help participant work on and attain dietary goals.;Weight Management/Obesity: Establish reasonable short term and long term weight goals.    Expected Outcomes Short Term: Continue to assess and modify interventions until short term weight is achieved;Long Term: Adherence to nutrition and physical activity/exercise program aimed toward attainment of established weight goal;Weight Loss: Understanding of general recommendations for a balanced deficit meal plan, which promotes 1-2 lb weight loss per week and includes a negative energy balance of [phone] kcal/d;Understanding recommendations for meals to include 15-35% energy as protein, 25-35% energy from fat, 35-60% energy from carbohydrates, less than 200mg  of dietary cholesterol, 20-35 gm of total fiber daily;Understanding of distribution of calorie intake throughout the day with the consumption of 4-5 meals/snacks    Hypertension Yes    Intervention Provide education on lifestyle modifcations including regular physical activity/exercise, weight management, moderate sodium restriction and increased consumption of fresh fruit, vegetables, and low fat dairy, alcohol  moderation, and smoking cessation.;Monitor prescription use compliance.    Expected Outcomes Short Term: Continued assessment and intervention until BP is < 140/68mm HG in hypertensive participants. < 130/33mm HG in hypertensive participants with diabetes, heart failure or chronic kidney  disease.;Long Term: Maintenance of blood pressure at goal levels.    Lipids Yes    Intervention Provide education and support for participant on nutrition & aerobic/resistive exercise along with prescribed medications to achieve LDL 70mg , HDL >40mg .    Expected Outcomes Short Term: Participant states understanding of desired cholesterol values and is compliant with medications prescribed. Participant is following exercise prescription and nutrition guidelines.;Long Term: Cholesterol controlled with medications as prescribed, with individualized exercise RX and with personalized nutrition plan. Value goals: LDL < 70mg , HDL > 40 mg.          Education:Diabetes - Individual verbal and written instruction to review signs/symptoms of diabetes, desired ranges of glucose level fasting, after meals and with exercise. Acknowledge that pre and post exercise glucose checks will be done for 3 sessions at entry of program.   Core Components/Risk Factors/Patient Goals Review:   Goals and Risk Factor Review     Row Name 10/28/23 1510 12/04/23 1405 01/13/24 1408         Core Components/Risk Factors/Patient Goals Review   Personal Goals Review Weight Management/Obesity;Hypertension Weight Management/Obesity;Lipids;Hypertension Weight Management/Obesity;Lipids;Hypertension     Review Ethaniel states he is monitoring his blood pressure  at home with his wrist monitor and is getting similar readings as he does in rehab. He states he is checking his weight daily and has a goal to get down to 190lb. He knows it will take discipline and be hard. He has switched to drinking water and will occasionally have a soft drink. He also started to split meals with his girlfriend when they go out to help with portion size. Stiles reports that he checks his weight and blood pressure at home. He reports that his weight is up and down slightly but mostly stays steady. She reports that he takes all her BP and cholesterol meds and  follows up with his docotor for appointments and lab work. He wants to continue to work on weight loss with a goal of 190 lbs. Brogan states that he would like to lose a little more weight with a goal of getting under 200 lb. He states that when he weighs at home with no clothes that he is around 203 lb and is getting closer to his goal. He owns a BP cuff at home and has been checking his BP only when he feels that he needs to check it. He also continues to take all of his medications as prescribed.     Expected Outcomes Short: Cotinue monitoring weight and blood pressure at home. Long: Continue managing cardiovascular risk factors. Short: contine to work towards goal weight of 190 lbs. Continue to check BP at home. Long: control cardiac risk factors. Short: Contine to work towards weight goal. Long: Continue to manage lifestyle risk factors.        Core Components/Risk Factors/Patient Goals at Discharge (Final Review):   Goals and Risk Factor Review - 01/13/24 1408       Core Components/Risk Factors/Patient Goals Review   Personal Goals Review Weight Management/Obesity;Lipids;Hypertension    Review Aryaan states that he would like to lose a little more weight with a goal of getting under 200 lb. He states that when he weighs at home with no clothes that he is around 203 lb and is getting closer to his goal. He owns a BP cuff at home and has been checking his BP only when he feels that he needs to check it. He also continues to take all of his medications as prescribed.    Expected Outcomes Short: Contine to work towards weight goal. Long: Continue to manage lifestyle risk factors.          ITP Comments:  ITP Comments     Row Name 09/06/23 1012 09/17/23 1510 09/23/23 1406 10/02/23 0955 10/30/23 1439   ITP Comments Virtual Visit completed. Patient informed on EP and RD appointment and 6 Minute walk test. Patient also informed of patient health questionnaires on My Chart. Patient Verbalizes  understanding. Visit diagnosis can be found in [facility] 4//29/2025. Completed and gym orientation for cardiac rehab. Initial ITP created and sent for review to Dr. Oneil Pinal, Medical Director. First full day of exercise!  Patient was oriented to gym and equipment including functions, settings, policies, and procedures.  Patient's individual exercise prescription and treatment plan were reviewed.  All starting workloads were established based on the results of the 6 minute walk test done at initial orientation visit.  The plan for exercise progression was also introduced and progression will be customized based on patient's performance and goals. 30 Day review completed. Medical Director ITP review done; changes made as directed and signed approval by Medical Director. New to program. 30  Day review completed. Medical Director ITP review done; changes made as directed and signed approval by Medical Director.    Row Name 11/27/23 0946 12/25/23 1257 01/22/24 0927       ITP Comments 30 Day review completed. Medical Director ITP review done; changes made as directed and signed approval by Medical Director. 30 Day review completed. Medical Director ITP review done, changes made as directed, and signed approval by Medical Director. 30 Day review completed. Medical Director ITP review done, changes made as directed, and signed approval by Medical Director.        Comments: 30 day review     [1]  Current Outpatient Medications:    aspirin  81 MG chewable tablet, Chew 81 mg by mouth. (Patient not taking: Reported on 09/06/2023), Disp: , Rfl:    aspirin  81 MG chewable tablet, Chew 81 mg by mouth., Disp: , Rfl:    carvedilol (COREG) 6.25 MG tablet, Take 6.25 mg by mouth., Disp: , Rfl:    clopidogrel (PLAVIX) 75 MG tablet, Take 75 mg by mouth., Disp: , Rfl:    furosemide  (LASIX ) 20 MG tablet, Take 20 mg by mouth daily. (Patient not taking: Reported on 09/06/2023), Disp: , Rfl:    furosemide  (LASIX ) 40 MG tablet,  Take 40 mg by mouth daily. (Patient not taking: Reported on 09/06/2023), Disp: , Rfl:    Multiple Vitamins-Minerals (CENTRUM SILVER 50+MEN PO), Take by mouth., Disp: , Rfl:    nitroGLYCERIN  (NITROSTAT ) 0.4 MG SL tablet, Place 0.4 mg under the tongue every 5 (five) minutes as needed for chest pain., Disp: , Rfl:    nitroGLYCERIN  (NITROSTAT ) 0.4 MG SL tablet, Place 0.4 mg under the tongue. (Patient not taking: Reported on 09/06/2023), Disp: , Rfl:    omeprazole (PRILOSEC) 20 MG capsule, Take 20 mg by mouth daily. (Patient not taking: Reported on 09/06/2023), Disp: , Rfl:    pantoprazole (PROTONIX) 40 MG tablet, Take 40 mg by mouth., Disp: , Rfl:    rosuvastatin (CRESTOR) 20 MG tablet, Take 1 tablet by mouth at bedtime., Disp: , Rfl:    sacubitril-valsartan (ENTRESTO) 24-26 MG, Take 1 tablet by mouth every 12 (twelve) hours., Disp: , Rfl:    spironolactone (ALDACTONE) 25 MG tablet, Take 12.5 mg by mouth., Disp: , Rfl:    ticagrelor (BRILINTA) 90 MG TABS tablet, Take 90 mg by mouth 2 (two) times daily. (Patient not taking: Reported on 09/06/2023), Disp: , Rfl:  [2]  Social History Tobacco Use  Smoking Status Never  Smokeless Tobacco Current   Types: Snuff

## 2024-01-27 ENCOUNTER — Encounter

## 2024-01-27 DIAGNOSIS — Z955 Presence of coronary angioplasty implant and graft: Secondary | ICD-10-CM

## 2024-01-27 DIAGNOSIS — I214 Non-ST elevation (NSTEMI) myocardial infarction: Secondary | ICD-10-CM

## 2024-01-27 NOTE — Progress Notes (Signed)
 Daily Session Note  Patient Details  Name: Theodore Wiggins MRN: [id number] Date of Birth: 1945-09-13 Referring Provider:   Flowsheet Row Cardiac Rehab from 09/17/2023 in [facility] Cardiac and Pulmonary Rehab  Referring Provider Dr. Keller Paterson    Encounter Date: 01/27/2024  Check In:  Session Check In - 01/27/24 1411       Check-In   Supervising physician immediately available to respond to emergencies See telemetry face sheet for immediately available ER MD    Location [facility]-Cardiac & Pulmonary Rehab    Staff Present Leita Franks RN,BSN;Noah Tickle, BS, Exercise Physiologist;Margaret Best, MS, Exercise Physiologist;Kristen Coble RN,BC,MSN    Virtual Visit No    Medication changes reported     No    Fall or balance concerns reported    No    Tobacco Cessation No Change    Warm-up and Cool-down Performed on first and last piece of equipment    Resistance Training Performed Yes    VAD Patient? No    PAD/SET Patient? No      Pain Assessment   Currently in Pain? No/denies             Tobacco Use History[1]  Goals Met:  Independence with exercise equipment Exercise tolerated well No report of concerns or symptoms today Strength training completed today  Goals Unmet:  Not Applicable  Comments: Pt able to follow exercise prescription today without complaint.  Will continue to monitor for progression.    Dr. Oneil Pinal is Medical Director for [facility] Cardiac Rehabilitation.  Dr. Fuad Aleskerov is Medical Director for [facility] Pulmonary Rehabilitation.    [1]  Social History Tobacco Use  Smoking Status Never  Smokeless Tobacco Current   Types: Snuff

## 2024-01-29 ENCOUNTER — Encounter

## 2024-02-03 ENCOUNTER — Encounter

## 2024-02-05 ENCOUNTER — Encounter

## 2024-02-10 ENCOUNTER — Encounter: Attending: Cardiology | Admitting: Emergency Medicine

## 2024-02-10 VITALS — Ht 70.1 in | Wt 215.1 lb

## 2024-02-10 DIAGNOSIS — Z48812 Encounter for surgical aftercare following surgery on the circulatory system: Secondary | ICD-10-CM | POA: Insufficient documentation

## 2024-02-10 DIAGNOSIS — Z955 Presence of coronary angioplasty implant and graft: Secondary | ICD-10-CM | POA: Diagnosis present

## 2024-02-10 DIAGNOSIS — I252 Old myocardial infarction: Secondary | ICD-10-CM | POA: Insufficient documentation

## 2024-02-10 DIAGNOSIS — I214 Non-ST elevation (NSTEMI) myocardial infarction: Secondary | ICD-10-CM | POA: Diagnosis present

## 2024-02-10 NOTE — Progress Notes (Signed)
 Daily Session Note  Patient Details  Name: Theodore Wiggins MRN: [id number] Date of Birth: 01-19-1946 Referring Provider:   Flowsheet Row Cardiac Rehab from 09/17/2023 in [facility]  Referring Provider Dr. Keller Paterson    Encounter Date: 02/10/2024  Check In:  Session Check In - 02/10/24 1359       Check-In   Supervising physician immediately available to respond to emergencies See telemetry face sheet for immediately available ER MD    Location [facility]-Cardiac & Pulmonary Rehab    Staff Present Leita Franks RN,BSN;Maxon Burnell BS, Exercise Physiologist;Margaret Best, MS, Exercise Physiologist;Noah Tickle, BS, Exercise Physiologist    Virtual Visit No    Medication changes reported     No    Fall or balance concerns reported    No    Warm-up and Cool-down Performed on first and last piece of equipment    Resistance Training Performed Yes    VAD Patient? No    PAD/SET Patient? No      Pain Assessment   Currently in Pain? No/denies             Tobacco Use History[1]  Goals Met:  Independence with exercise equipment Exercise tolerated well No report of concerns or symptoms today Strength training completed today  Goals Unmet:  Not Applicable  Comments: Pt able to follow exercise prescription today without complaint.  Will continue to monitor for progression.    Dr. Oneil Pinal is Medical Director for [facility] Cardiac Rehabilitation.  Dr. Fuad Aleskerov is Medical Director for [facility] Pulmonary Rehabilitation.    [1]  Social History Tobacco Use  Smoking Status Never  Smokeless Tobacco Current   Types: Snuff

## 2024-02-10 NOTE — Patient Instructions (Signed)
 Discharge Patient Instructions  Patient Details  Name: Theodore Wiggins MRN: [id number] Date of Birth: Apr 16, 1945 Referring Provider:  Rudolpho Norleen BIRCH, MD   Number of Visits: 36  Reason for Discharge:  Patient reached a stable level of exercise. Patient independent in their exercise. Patient has met program and personal goals.  Diagnosis:  NSTEMI (non-ST elevated myocardial infarction) [facility])  Status post coronary artery stent placement  Initial Exercise Prescription:  Initial Exercise Prescription - 09/17/23 1500       Date of Initial Exercise RX and Referring Provider   Date 09/17/23    Referring Provider Dr. Keller Paterson      Oxygen   Maintain Oxygen Saturation 88% or higher      Treadmill   MPH 1.7    Grade 0    Minutes 15    METs 2.3      Recumbant Bike   Level 2    RPM 50    Watts 25    Minutes 15    METs 1.5      T5 Nustep   Level 2    SPM 80    Minutes 15    METs 1.5      Track   Laps 24    Minutes 15    METs 2.31      Prescription Details   Duration Progress to 30 minutes of continuous aerobic without signs/symptoms of physical distress      Intensity   THRR 40-80% of Max Heartrate 94-126    Ratings of Perceived Exertion 11-13    Perceived Dyspnea 0-4      Progression   Progression Continue to progress workloads to maintain intensity without signs/symptoms of physical distress.      Resistance Training   Training Prescription Yes    Weight 4lb    Reps 10-15          Discharge Exercise Prescription (Final Exercise Prescription Changes):  Exercise Prescription Changes - 02/04/24 1500       Response to Exercise   Blood Pressure (Admit) 118/76    Blood Pressure (Exit) 116/70    Heart Rate (Admit) 76 bpm    Heart Rate (Exercise) 102 bpm    Heart Rate (Exit) 93 bpm    Oxygen Saturation (Admit) 96 %    Oxygen Saturation (Exercise) 92 %    Oxygen Saturation (Exit) 96 %    Rating of Perceived Exertion (Exercise) 13    Symptoms  none    Duration Continue with 30 min of aerobic exercise without signs/symptoms of physical distress.    Intensity THRR unchanged      Progression   Progression Continue to progress workloads to maintain intensity without signs/symptoms of physical distress.    Average METs 3.21      Resistance Training   Training Prescription Yes    Weight 7 lb    Reps 10-15      Interval Training   Interval Training No      Treadmill   MPH 1.7    Grade 6.5    Minutes 15    METs 3.83      NuStep   Level 8   T6   Minutes 15    METs 3.5      T5 Nustep   Level 5   T4 and T5   Minutes 15    METs 3.3      Biostep-RELP   Level 5    Minutes 15    METs 4  Home Exercise Plan   Plans to continue exercise at Home (comment)   TM, rower, walking   Frequency Add 2 additional days to program exercise sessions.    Initial Home Exercises Provided 11/27/23      Oxygen   Maintain Oxygen Saturation 88% or higher          Functional Capacity:  6 Minute Walk     Row Name 09/17/23 1511 02/10/24 1411       6 Minute Walk   Phase Initial Discharge    Distance 920 feet 1145 feet    Distance % Change -- 24.5 %    Distance Feet Change -- 225 ft    Walk Time 6 minutes 6 minutes    # of Rest Breaks 0 0    MPH 1.74 2.17    METS 1.5 2.22    RPE 13 13    Perceived Dyspnea  2.5 1    VO2 Peak 5.25 7.77    Symptoms Yes (comment) Yes (comment)    Comments SOB used cane    Resting HR 62 bpm 79 bpm    Resting BP 122/70 134/82    Resting Oxygen Saturation  97 % 97 %    Exercise Oxygen Saturation  during 6 min walk 100 % 95 %    Max Ex. HR 75 bpm 94 bpm    Max Ex. BP 138/64 166/88    2 Minute Post BP 116/66 --      Nutrition & Weight - Outcomes:  Pre Biometrics - 09/17/23 1516       Pre Biometrics   Height 5' 10.1 (1.781 m)    Weight 208 lb 1.6 oz (94.4 kg)    Waist Circumference 43.5 inches    Hip Circumference 42.5 inches    Waist to Hip Ratio 1.02 %    BMI (Calculated) 29.76     Single Leg Stand 3.6 seconds          Post Biometrics - 02/10/24 1413        Post  Biometrics   Height 5' 10.1 (1.781 m)    Weight 215 lb 1.6 oz (97.6 kg)    Waist Circumference 46 inches    Hip Circumference 41.5 inches    Waist to Hip Ratio 1.11 %    BMI (Calculated) 30.76    Single Leg Stand 5.5 seconds

## 2024-02-12 ENCOUNTER — Encounter

## 2024-02-12 DIAGNOSIS — Z955 Presence of coronary angioplasty implant and graft: Secondary | ICD-10-CM

## 2024-02-12 DIAGNOSIS — I214 Non-ST elevation (NSTEMI) myocardial infarction: Secondary | ICD-10-CM

## 2024-02-12 DIAGNOSIS — Z48812 Encounter for surgical aftercare following surgery on the circulatory system: Secondary | ICD-10-CM | POA: Diagnosis not present

## 2024-02-12 NOTE — Progress Notes (Signed)
 Daily Session Note  Patient Details  Name: Theodore Wiggins MRN: [id number] Date of Birth: [birth year] Referring Provider:   Flowsheet Row Cardiac Rehab from 09/17/2023 in [facility]  Referring Provider Dr. Keller Paterson    Encounter Date: 02/12/2024  Check In:  Session Check In - 02/12/24 1352       Check-In   Supervising physician immediately available to respond to emergencies See telemetry face sheet for immediately available ER MD    Location [facility]    Staff Present Burnard Davenport RN,BSN,MPA;Laura Cates RN,BSN;Noah Laird, BS, Exercise Physiologist;Dajahnae Vondra Dyane HECKLE, ACSM CEP, Exercise Physiologist    Virtual Visit No    Medication changes reported     No    Fall or balance concerns reported    No    Tobacco Cessation No Change    Warm-up and Cool-down Performed on first and last piece of equipment    Resistance Training Performed Yes    VAD Patient? No    PAD/SET Patient? No      Pain Assessment   Currently in Pain? No/denies             Tobacco Use History[1]  Goals Met:  Proper associated with RPD/PD & O2 Sat Independence with exercise equipment Exercise tolerated well No report of concerns or symptoms today Strength training completed today  Goals Unmet:  Not Applicable  Comments: Pt able to follow exercise prescription today without complaint.  Will continue to monitor for progression.    Dr. Oneil Pinal is Medical Director for [facility] Cardiac Rehabilitation.  Dr. Fuad Aleskerov is Medical Director for [facility] Pulmonary Rehabilitation.    [1]  Social History Tobacco Use  Smoking Status Never  Smokeless Tobacco Current   Types: Snuff

## 2024-02-17 ENCOUNTER — Encounter: Admitting: Emergency Medicine

## 2024-02-17 DIAGNOSIS — Z48812 Encounter for surgical aftercare following surgery on the circulatory system: Secondary | ICD-10-CM | POA: Diagnosis not present

## 2024-02-17 DIAGNOSIS — I214 Non-ST elevation (NSTEMI) myocardial infarction: Secondary | ICD-10-CM

## 2024-02-17 DIAGNOSIS — Z955 Presence of coronary angioplasty implant and graft: Secondary | ICD-10-CM

## 2024-02-17 NOTE — Progress Notes (Signed)
 Daily Session Note  Patient Details  Name: Theodore Wiggins MRN: [id number] Date of Birth: [birth year] Referring Provider:   Flowsheet Row Cardiac Rehab from 09/17/2023 in [facility] and Pulmonary Re[facility]  Referring Provider Dr. Keller Paterson    Encounter Date: 02/17/2024  Check In:  Session Check In - 02/17/24 1353       Check-In   Supervising physician immediately available to respond to emergencies See telemetry face sheet for immediately available ER MD    Location [facility]    Staff Present Leita Franks RN,BSN;Maxon Burnell BS, Exercise Physiologist;Meredith Tressa RN,BSN;Margaret Best, MS, Exercise Physiologist    Virtual Visit No    Medication changes reported     No    Fall or balance concerns reported    No    Tobacco Cessation No Change    Warm-up and Cool-down Performed on first and last piece of equipment    Resistance Training Performed Yes    VAD Patient? No    PAD/SET Patient? No      Pain Assessment   Currently in Pain? No/denies             Tobacco Use History[1]  Goals Met:  Independence with exercise equipment Exercise tolerated well No report of concerns or symptoms today Strength training completed today  Goals Unmet:  Not Applicable  Comments: Pt able to follow exercise prescription today without complaint.  Will continue to monitor for progression.    Dr. Oneil Pinal is Medical Director for [facility] Cardiac Rehabilitation.  Dr. Fuad Aleskerov is Medical Director for [facility] Pulmonary Rehabilitation.    [1]  Social History Tobacco Use  Smoking Status Never  Smokeless Tobacco Current   Types: Snuff

## 2024-02-19 ENCOUNTER — Encounter: Payer: Self-pay | Admitting: *Deleted

## 2024-02-19 ENCOUNTER — Encounter

## 2024-02-19 DIAGNOSIS — I214 Non-ST elevation (NSTEMI) myocardial infarction: Secondary | ICD-10-CM

## 2024-02-19 DIAGNOSIS — Z955 Presence of coronary angioplasty implant and graft: Secondary | ICD-10-CM

## 2024-02-19 DIAGNOSIS — Z48812 Encounter for surgical aftercare following surgery on the circulatory system: Secondary | ICD-10-CM | POA: Diagnosis not present

## 2024-02-19 NOTE — Progress Notes (Signed)
 Cardiac Individual Treatment Plan  Patient Details  Name: Theodore Wiggins MRN: [id number] Date of Birth: Jul 13, 1945 Referring Provider:   Flowsheet Row Cardiac Rehab from 09/17/2023 in [facility] Cardiac and Pulmonary Rehab  Referring Provider Dr. Keller Paterson    Initial Encounter Date:  Flowsheet Row Cardiac Rehab from 09/17/2023 in [facility] Cardiac and Pulmonary Rehab  Date 09/17/23    Visit Diagnosis: NSTEMI (non-ST elevated myocardial infarction) [facility])  Patient's Home Medications on Admission: Current Medications[1]  Past Medical History: Past Medical History:  Diagnosis Date   Arthritis    Coronary artery disease    DDD (degenerative disc disease), lumbar    DDD (degenerative disc disease), lumbar    Depression    Dyspnea    GERD (gastroesophageal reflux disease)    Hyperlipidemia    Hypertension    NSTEMI (non-ST elevated myocardial infarction) (HCC) 06/04/2023   Post herpetic neuralgia    Vertigo     Tobacco Use: Tobacco Use History[2]  Labs: Review Flowsheet       Latest Ref Rng & Units 10/01/2017 04/29/2020 06/04/2023  Labs for ITP Cardiac and Pulmonary Rehab  Cholestrol 0 - 200 mg/dL - - 885   LDL (calc) 0 - 99 mg/dL - - 63   HDL-C >59 mg/dL - - 34   Trlycerides <849 mg/dL - - 84   Hemoglobin J8r 4.8 - 5.6 % 5.5  - 5.5   O2 Saturation % - 97.1  -     Exercise Target Goals: Exercise Program Goal: Individual exercise prescription set using results from initial 6 min walk test and THRR while considering  patients activity barriers and safety.   Exercise Prescription Goal: Initial exercise prescription builds to 30-45 minutes a day of aerobic activity, 2-3 days per week.  Home exercise guidelines will be given to patient during program as part of exercise prescription that the participant will acknowledge.   Education: Aerobic Exercise: - Group verbal and visual presentation on the components of exercise prescription. Introduces F.I.T.T principle from ACSM for  exercise prescriptions.  Reviews F.I.T.T. principles of aerobic exercise including progression. Written material provided at class time. Flowsheet Row Cardiac Rehab from 12/04/2023 in [facility] Cardiac and Pulmonary Rehab  Education need identified 09/17/23  Date 11/20/23  Educator mb  Instruction Review Code 1- Verbalizes Understanding    Education: Resistance Exercise: - Group verbal and visual presentation on the components of exercise prescription. Introduces F.I.T.T principle from ACSM for exercise prescriptions  Reviews F.I.T.T. principles of resistance exercise including progression. Written material provided at class time.    Education: Exercise & Equipment Safety: - Individual verbal instruction and demonstration of equipment use and safety with use of the equipment. Flowsheet Row Cardiac Rehab from 12/04/2023 in [facility] Cardiac and Pulmonary Rehab  Date 09/17/23  Educator [facility]  Instruction Review Code 1- Verbalizes Understanding    Education: Exercise Physiology & General Exercise Guidelines: - Group verbal and written instruction with models to review the exercise physiology of the cardiovascular system and associated critical values. Provides general exercise guidelines with specific guidelines to those with heart or lung disease. Written material provided at class time. Flowsheet Row Cardiac Rehab from 12/04/2023 in [facility] Cardiac and Pulmonary Rehab  Education need identified 09/17/23    Education: Flexibility, Balance, Mind/Body Relaxation: - Group verbal and visual presentation with interactive activity on the components of exercise prescription. Introduces F.I.T.T principle from ACSM for exercise prescriptions. Reviews F.I.T.T. principles of flexibility and balance exercise training including progression. Also discusses the mind body  connection.  Reviews various relaxation techniques to help reduce and manage stress (i.e. Deep breathing, progressive muscle relaxation, and  visualization). Balance handout provided to take home. Written material provided at class time.   Activity Barriers & Risk Stratification:   6 Minute Walk:  6 Minute Walk     Row Name 09/17/23 1511 02/10/24 1411       6 Minute Walk   Phase Initial Discharge    Distance 920 feet 1145 feet    Distance % Change -- 24.5 %    Distance Feet Change -- 225 ft    Walk Time 6 minutes 6 minutes    # of Rest Breaks 0 0    MPH 1.74 2.17    METS 1.5 2.22    RPE 13 13    Perceived Dyspnea  2.5 1    VO2 Peak 5.25 7.77    Symptoms Yes (comment) Yes (comment)    Comments SOB used cane    Resting HR 62 bpm 79 bpm    Resting BP 122/70 134/82    Resting Oxygen Saturation  97 % 97 %    Exercise Oxygen Saturation  during 6 min walk 100 % 95 %    Max Ex. HR 75 bpm 94 bpm    Max Ex. BP 138/64 166/88    2 Minute Post BP 116/66 --       Oxygen Initial Assessment:   Oxygen Re-Evaluation:   Oxygen Discharge (Final Oxygen Re-Evaluation):   Initial Exercise Prescription:  Initial Exercise Prescription - 09/17/23 1500       Date of Initial Exercise RX and Referring Provider   Date 09/17/23    Referring Provider Dr. Keller Paterson      Oxygen   Maintain Oxygen Saturation 88% or higher      Treadmill   MPH 1.7    Grade 0    Minutes 15    METs 2.3      Recumbant Bike   Level 2    RPM 50    Watts 25    Minutes 15    METs 1.5      T5 Nustep   Level 2    SPM 80    Minutes 15    METs 1.5      Track   Laps 24    Minutes 15    METs 2.31      Prescription Details   Duration Progress to 30 minutes of continuous aerobic without signs/symptoms of physical distress      Intensity   THRR 40-80% of Max Heartrate 94-126    Ratings of Perceived Exertion 11-13    Perceived Dyspnea 0-4      Progression   Progression Continue to progress workloads to maintain intensity without signs/symptoms of physical distress.      Resistance Training   Training Prescription Yes    Weight  4lb    Reps 10-15          Perform Capillary Blood Glucose checks as needed.  Exercise Prescription Changes:   Exercise Prescription Changes     Row Name 09/17/23 1500 10/09/23 1500 10/24/23 1300 11/06/23 0800 11/27/23 1400     Response to Exercise   Blood Pressure (Admit) 122/70 142/80 124/82 130/84 --   Blood Pressure (Exercise) 138/64 146/86 120/80 134/78 --   Blood Pressure (Exit) 116/66 120/64 110/62 122/66 --   Heart Rate (Admit) 62 bpm 67 bpm 77 bpm 76 bpm --   Heart Rate (Exercise) 75  bpm 95 bpm 90 bpm 100 bpm --   Heart Rate (Exit) 61 bpm 76 bpm 74 bpm 76 bpm --   Oxygen Saturation (Admit) 97 % -- -- -- --   Oxygen Saturation (Exercise) 100 % -- -- -- --   Oxygen Saturation (Exit) 100 % -- -- -- --   Rating of Perceived Exertion (Exercise) 13 13 15 15  --   Perceived Dyspnea (Exercise) 2.5 -- -- -- --   Symptoms SOB none none none --   Comments results 1st 2 weeks of exercise -- -- --   Duration -- Progress to 30 minutes of  aerobic without signs/symptoms of physical distress Continue with 30 min of aerobic exercise without signs/symptoms of physical distress. Continue with 30 min of aerobic exercise without signs/symptoms of physical distress. Continue with 30 min of aerobic exercise without signs/symptoms of physical distress.   Intensity -- THRR unchanged THRR unchanged THRR unchanged THRR unchanged     Progression   Progression -- Continue to progress workloads to maintain intensity without signs/symptoms of physical distress. Continue to progress workloads to maintain intensity without signs/symptoms of physical distress. Continue to progress workloads to maintain intensity without signs/symptoms of physical distress. Continue to progress workloads to maintain intensity without signs/symptoms of physical distress.   Average METs -- 2.24 2.25 2.62 2.62     Resistance Training   Training Prescription -- Yes Yes Yes Yes   Weight -- 4lb 4 lb 4 lb 4 lb   Reps --  10-15 10-15 10-15 10-15     Interval Training   Interval Training -- No No No No     Oxygen   Oxygen -- Continuous -- -- --     Treadmill   MPH -- 2 1.7 2 2    Grade -- 0 0 0 0   Minutes -- 15 15 15 15    METs -- 2.53 2.3 2.53 2.53     Recumbant Bike   Level -- 3 -- -- --   Watts -- 25 -- -- --   Minutes -- 15 -- -- --   METs -- 2.83 -- -- --     T5 Nustep   Level -- 5 5 5 5    Minutes -- 15 15 15 15    METs -- 2 2.2 2.4 2.4     Biostep-RELP   Level -- -- -- 7 7   Minutes -- -- -- 15 15   METs -- -- -- 3 3     Home Exercise Plan   Plans to continue exercise at -- -- -- -- Home (comment)  TM, rower, walking   Frequency -- -- -- -- Add 2 additional days to program exercise sessions.   Initial Home Exercises Provided -- -- -- -- 11/27/23     Oxygen   Maintain Oxygen Saturation -- 88% or higher 88% or higher 88% or higher 88% or higher    Row Name 12/04/23 1600 12/18/23 0800 12/30/23 1100 01/15/24 1100 02/04/24 1500     Response to Exercise   Blood Pressure (Admit) 120/78 130/72 132/84 150/82 118/76   Blood Pressure (Exercise) 148/80 -- -- -- --   Blood Pressure (Exit) 122/70 130/72 128/80 122/72 116/70   Heart Rate (Admit) 60 bpm 81 bpm 76 bpm 71 bpm 76 bpm   Heart Rate (Exercise) 101 bpm 104 bpm 95 bpm 92 bpm 102 bpm   Heart Rate (Exit) 88 bpm 72 bpm 75 bpm 76 bpm 93 bpm   Oxygen Saturation (Admit) -- --  98 % 98 % 96 %   Oxygen Saturation (Exercise) -- -- 95 % 96 % 92 %   Oxygen Saturation (Exit) -- -- 94 % 98 % 96 %   Rating of Perceived Exertion (Exercise) 15 15 15 15 13    Perceived Dyspnea (Exercise) -- -- -- 3 --   Symptoms none none none none none   Duration Continue with 30 min of aerobic exercise without signs/symptoms of physical distress. Continue with 30 min of aerobic exercise without signs/symptoms of physical distress. Continue with 30 min of aerobic exercise without signs/symptoms of physical distress. Continue with 30 min of aerobic exercise without  signs/symptoms of physical distress. Continue with 30 min of aerobic exercise without signs/symptoms of physical distress.   Intensity THRR unchanged THRR unchanged THRR unchanged THRR unchanged THRR unchanged     Progression   Progression Continue to progress workloads to maintain intensity without signs/symptoms of physical distress. Continue to progress workloads to maintain intensity without signs/symptoms of physical distress. Continue to progress workloads to maintain intensity without signs/symptoms of physical distress. Continue to progress workloads to maintain intensity without signs/symptoms of physical distress. Continue to progress workloads to maintain intensity without signs/symptoms of physical distress.   Average METs 2.99 3.22 3.42 3.29 3.21     Resistance Training   Training Prescription Yes Yes Yes Yes Yes   Weight 4 lb 4 lb 4 lb 4 lb 7 lb   Reps 10-15 10-15 10-15 10-15 10-15     Interval Training   Interval Training No No No No No     Treadmill   MPH 2 2 2  1.8 1.7   Grade 3 4 4 4  6.5   Minutes 15 15 15 15 15    METs 3.36 3.63 3.63 3.37 3.83     NuStep   Level 5  T6 -- -- 7 8  T6   SPM 80 -- -- -- --   Minutes 15 -- -- 15 15   METs 2.3 -- -- 4 3.5     T5 Nustep   Level 3 4 4  -- 5  T4 and T5   Minutes 15 15 15  -- 15   METs 2.2 2.4 -- -- 3.3     Biostep-RELP   Level 8 5 5 6 5    Minutes 15 15 15 15 15    METs 4 4 4 2 4      Home Exercise Plan   Plans to continue exercise at Home (comment)  TM, rower, walking Home (comment)  TM, rower, walking Home (comment)  TM, rower, walking Home (comment)  TM, rower, walking Home (comment)  TM, rower, walking   Frequency Add 2 additional days to program exercise sessions. Add 2 additional days to program exercise sessions. Add 2 additional days to program exercise sessions. Add 2 additional days to program exercise sessions. Add 2 additional days to program exercise sessions.   Initial Home Exercises Provided 11/27/23 11/27/23  11/27/23 11/27/23 11/27/23     Oxygen   Maintain Oxygen Saturation 88% or higher 88% or higher 88% or higher 88% or higher 88% or higher      Exercise Comments:   Exercise Comments     Row Name 09/23/23 1406           Exercise Comments First full day of exercise!  Patient was oriented to gym and equipment including functions, settings, policies, and procedures.  Patient's individual exercise prescription and treatment plan were reviewed.  All starting workloads were established based on  the results of the 6 minute walk test done at initial orientation visit.  The plan for exercise progression was also introduced and progression will be customized based on patient's performance and goals.          Exercise Goals and Review:   Exercise Goals     Row Name 09/17/23 1515             Exercise Goals   Increase Physical Activity Yes       Intervention Provide advice, education, support and counseling about physical activity/exercise needs.;Develop an individualized exercise prescription for aerobic and resistive training based on initial evaluation findings, risk stratification, comorbidities and participant's personal goals.       Expected Outcomes Short Term: Attend rehab on a regular basis to increase amount of physical activity.;Long Term: Add in home exercise to make exercise part of routine and to increase amount of physical activity.;Long Term: Exercising regularly at least 3-5 days a week.       Increase Strength and Stamina Yes       Intervention Provide advice, education, support and counseling about physical activity/exercise needs.;Develop an individualized exercise prescription for aerobic and resistive training based on initial evaluation findings, risk stratification, comorbidities and participant's personal goals.       Expected Outcomes Short Term: Increase workloads from initial exercise prescription for resistance, speed, and METs.;Short Term: Perform resistance  training exercises routinely during rehab and add in resistance training at home;Long Term: Improve cardiorespiratory fitness, muscular endurance and strength as measured by increased METs and functional capacity ( )       Able to understand and use rate of perceived exertion (RPE) scale Yes       Intervention Provide education and explanation on how to use RPE scale       Expected Outcomes Short Term: Able to use RPE daily in rehab to express subjective intensity level;Long Term:  Able to use RPE to guide intensity level when exercising independently       Able to understand and use Dyspnea scale Yes       Intervention Provide education and explanation on how to use Dyspnea scale       Expected Outcomes Short Term: Able to use Dyspnea scale daily in rehab to express subjective sense of shortness of breath during exertion;Long Term: Able to use Dyspnea scale to guide intensity level when exercising independently       Knowledge and understanding of Target Heart Rate Range (THRR) Yes       Intervention Provide education and explanation of THRR including how the numbers were predicted and where they are located for reference       Expected Outcomes Short Term: Able to use daily as guideline for intensity in rehab;Short Term: Able to state/look up THRR;Long Term: Able to use THRR to govern intensity when exercising independently       Able to check pulse independently Yes       Intervention Provide education and demonstration on how to check pulse in carotid and radial arteries.;Review the importance of being able to check your own pulse for safety during independent exercise       Expected Outcomes Short Term: Able to explain why pulse checking is important during independent exercise;Long Term: Able to check pulse independently and accurately       Understanding of Exercise Prescription Yes       Intervention Provide education, explanation, and written materials on patient's individual exercise  prescription  Expected Outcomes Short Term: Able to explain program exercise prescription;Long Term: Able to explain home exercise prescription to exercise independently          Exercise Goals Re-Evaluation :  Exercise Goals Re-Evaluation     Row Name 09/23/23 1407 10/09/23 1538 10/24/23 1355 11/06/23 0825 11/21/23 1138     Exercise Goal Re-Evaluation   Exercise Goals Review Increase Physical Activity;Able to understand and use rate of perceived exertion (RPE) scale;Knowledge and understanding of Target Heart Rate Range (THRR);Understanding of Exercise Prescription;Increase Strength and Stamina;Able to understand and use Dyspnea scale;Able to check pulse independently Increase Physical Activity;Understanding of Exercise Prescription;Increase Strength and Stamina Increase Physical Activity;Understanding of Exercise Prescription;Increase Strength and Stamina Increase Physical Activity;Understanding of Exercise Prescription;Increase Strength and Stamina Increase Physical Activity;Understanding of Exercise Prescription;Increase Strength and Stamina   Comments Reviewed RPE and dyspnea scale, THR and program prescription with pt today.  Pt voiced understanding and was given a copy of goals to take home. Harman is off to a good start in the program and completed his first 2 weeks of exercise sessions in this review. He worked at level 5 on the T5 nustep and level 3 on the recumbent bike. He had a workload on the treadmill of a speed of 2 mph and no incline. We will continue to monitor his progress in the program. [name] only attended two sessions since the last review. He continues to walk the treadmill but lowered his speed from 2 mph to 1.7 mph with no incline. He also has consistently worked at level 5 on the T5 nustep. We will continue to monitor his progress in the program. [name] only attended two sessions since the last review. He increased his treadmill workload back up to a speed of 2 mph with  no incline. He also began using the biostep and did well at level 7. We will continue to monitor his progress in the program. [place] did not attend rehab during the last review period due to sickness. He has since returned to exercise in the program. We will continue to monitor his progress in the program.   Expected Outcomes Short: Use RPE daily to regulate intensity.  Long: Follow program prescription in THR. Short: Continue to follow current exercise prescription. Long: Continue exercise to improve strength and stamina. Short: Attend rehab more consistently. Long: Continue exercise to improve strength and stamina. Short: Attend rehab more consistently. Long: Continue exercise to improve strength and stamina. Short: Return to regular attendance in the program. Long: Continue exercise to improve strength and stamina.    Row Name 11/27/23 1403 12/04/23 1355 12/04/23 1626 12/18/23 0839 12/30/23 1139     Exercise Goal Re-Evaluation   Exercise Goals Review Increase Physical Activity;Increase Strength and Stamina;Able to understand and use rate of perceived exertion (RPE) scale;Able to understand and use Dyspnea scale;Knowledge and understanding of Target Heart Rate Range (THRR);Able to check pulse independently;Understanding of Exercise Prescription Increase Strength and Stamina;Understanding of Exercise Prescription Increase Strength and Stamina;Understanding of Exercise Prescription;Increase Physical Activity Increase Strength and Stamina;Understanding of Exercise Prescription;Increase Physical Activity Increase Strength and Stamina;Understanding of Exercise Prescription;Increase Physical Activity   Comments Reviewed home exercise with pt today.  Pt plans to use rower, treadmill, and walking outside for exercise.  Reviewed THR, pulse, RPE, sign and symptoms, pulse oximetery and when to call 911 or MD.  Also discussed weather considerations and indoor options.  Pt voiced understanding. 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 He has not yet added a day of structured exercise at home but continues to stay active. He was encouraged to start at least one day of home exericse. He does have equipment he can use at home. Kyi is doing well in rehab. He was able to increase to level 8 on the biostep and worked at level 5 on the T6 nustep. He also increased his workload on the treadmill to a speed of 2 mph and incline of 3%. We will continue to monitor his progress in the program. Dairon is doing well in rehab. He was able to increase back up to level 4 on the T5 nustep. He also increased his workload on the treadmill to an incline of 4% while maintaining his speed at 2 mph. We will continue to monitor his progress in the program. Yeng is doing well in rehab. He maintained a speed of 2 mph on the treadmill with 4% incline. He also maintained level 5 on the biostep and level 4 on the T5 nustep. We will continue to monitor his progress in the program.   Expected Outcomes Short: add 1-2 days a week of exercise at home on off days of rehab. Long: maintain independent exercise routine upon graduation from cardiac rehab. Short: continue to stay active at home and add 1-2 days a week of structured exercise at home on off days of rehab. Long: maintain independent exercise routine upon graduation from cardiac rehab. Short: Continue to progressively increase treadmill and biostep workload. Long: Continue exercise to improve strength and stamina. Short: Continue to progressively increase workloads. Long: Continue exercise to improve strength and stamina. Short: Continue to progressively increase treadmill and nustep workloads. Long: Continue exercise to improve strength and stamina.    Row Name 01/06/24 1458 01/15/24 1157 02/04/24 1513 02/17/24 1408       Exercise Goal Re-Evaluation   Exercise Goals Review Increase Strength and Stamina;Understanding of Exercise  Prescription;Increase Physical Activity Increase Physical Activity;Understanding of Exercise Prescription;Increase Strength and Stamina Increase Physical Activity;Understanding of Exercise Prescription;Increase Strength and Stamina Increase Physical Activity;Increase Strength and Stamina;Understanding of Exercise Prescription    Comments Malaquias states he has not had time to do his home exercise because he has been extremely busy especially with the holiday season. He does do a lot of walking in the activities that are keeping him busy. He has also gotten back to doing some yardwork. We discussed setting a goal of trying to exercise 1 day a week at home. Kairyn continues to do well in rehab. He is due for his post in the next review period and hopes to improve. He increased all of his workloads. He increased his workload on the treadmill to a speed of 1.68mph and 4% incline. He also increased to level 7 on the T4 nustep and level 6 on the biostep. We will continue to monitor his progress in the program. Alek continues to do well in rehab. He is still due for his post . He increased to level 8 on the T6 nustep, level 5 on the T5 nustep, and increased to 7 lb handweights. He also increased his workload on the treadmill to a speed of 1.7 mph and incline of 6.5%. We will continue to monitor his progress in the program. Bow is doing well in rehab, he is active at home. Not doing any intentional exercise outside of rehab. Encourage him to look to add more cardio exercise at home.    Expected Outcomes Short: Exercise at  home 1 day a week. Long: Continue to exercise independently. Short: Improve on post . Long: Continue to increase overall METs and stamina. Short: Improve on post . Long: Continue to increase overall METs and stamina. STG: Add home exercise. LTG: Continue to increase overall METs and stamina.       Discharge Exercise Prescription (Final Exercise Prescription Changes):  Exercise  Prescription Changes - 02/04/24 1500       Response to Exercise   Blood Pressure (Admit) 118/76    Blood Pressure (Exit) 116/70    Heart Rate (Admit) 76 bpm    Heart Rate (Exercise) 102 bpm    Heart Rate (Exit) 93 bpm    Oxygen Saturation (Admit) 96 %    Oxygen Saturation (Exercise) 92 %    Oxygen Saturation (Exit) 96 %    Rating of Perceived Exertion (Exercise) 13    Symptoms none    Duration Continue with 30 min of aerobic exercise without signs/symptoms of physical distress.    Intensity THRR unchanged      Progression   Progression Continue to progress workloads to maintain intensity without signs/symptoms of physical distress.    Average METs 3.21      Resistance Training   Training Prescription Yes    Weight 7 lb    Reps 10-15      Interval Training   Interval Training No      Treadmill   MPH 1.7    Grade 6.5    Minutes 15    METs 3.83      NuStep   Level 8   T6   Minutes 15    METs 3.5      T5 Nustep   Level 5   T4 and T5   Minutes 15    METs 3.3      Biostep-RELP   Level 5    Minutes 15    METs 4      Home Exercise Plan   Plans to continue exercise at Home (comment)   TM, rower, walking   Frequency Add 2 additional days to program exercise sessions.    Initial Home Exercises Provided 11/27/23      Oxygen   Maintain Oxygen Saturation 88% or higher          Nutrition:  Target Goals: Understanding of nutrition guidelines, daily intake of sodium 1500mg , cholesterol 200mg , calories 30% from fat and 7% or less from saturated fats, daily to have 5 or more servings of fruits and vegetables.  Education: Nutrition 1 -Group instruction provided by verbal, written material, interactive activities, discussions, models, and posters to present general guidelines for heart healthy nutrition including macronutrients, label reading, and promoting whole foods over processed counterparts. Education serves as pensions consultant of discussion of heart healthy eating for  all. Written material provided at class time. Flowsheet Row Cardiac Rehab from 12/04/2023 in [facility] Cardiac and Pulmonary Rehab  Date 11/27/23  Educator jg  Instruction Review Code 1- Verbalizes Understanding     Education: Nutrition 2 -Group instruction provided by verbal, written material, interactive activities, discussions, models, and posters to present general guidelines for heart healthy nutrition including sodium, cholesterol, and saturated fat. Providing guidance of habit forming to improve blood pressure, cholesterol, and body weight. Written material provided at class time. Flowsheet Row Cardiac Rehab from 12/04/2023 in [facility] Cardiac and Pulmonary Rehab  Date 12/04/23  Educator jg  Instruction Review Code 1- Verbalizes Understanding      Biometrics:  Pre Biometrics - 09/17/23 1516  Pre Biometrics   Height 5' 10.1 (1.781 m)    Weight 208 lb 1.6 oz (94.4 kg)    Waist Circumference 43.5 inches    Hip Circumference 42.5 inches    Waist to Hip Ratio 1.02 %    BMI (Calculated) 29.76    Single Leg Stand 3.6 seconds          Post Biometrics - 02/10/24 1413        Post  Biometrics   Height 5' 10.1 (1.781 m)    Weight 215 lb 1.6 oz (97.6 kg)    Waist Circumference 46 inches    Hip Circumference 41.5 inches    Waist to Hip Ratio 1.11 %    BMI (Calculated) 30.76    Single Leg Stand 5.5 seconds          Nutrition Therapy Plan and Nutrition Goals:   Nutrition Assessments:  MEDIFICTS Score Key: >=70 Need to make dietary changes  40-70 Heart Healthy Diet <= 40 Therapeutic Level Cholesterol Diet  Flowsheet Row Cardiac Rehab from 02/12/2024 in [facility] Cardiac and Pulmonary Rehab  Picture Your Plate Total Score on Admission 56  Picture Your Plate Total Score on Discharge 58   Picture Your Plate Scores: <59 Unhealthy dietary pattern with much room for improvement. 41-50 Dietary pattern unlikely to meet recommendations for good health and room for  improvement. 51-60 More healthful dietary pattern, with some room for improvement.  >60 Healthy dietary pattern, although there may be some specific behaviors that could be improved.    Nutrition Goals Re-Evaluation:  Nutrition Goals Re-Evaluation     Row Name 10/28/23 1510 12/04/23 1357 01/13/24 1402         Goals   Nutrition Goal RD appointment deferred at this time RD appointment deferred at this time Pt continues to defer a meeting with the RD at this time.        Nutrition Goals Discharge (Final Nutrition Goals Re-Evaluation):  Nutrition Goals Re-Evaluation - 01/13/24 1402       Goals   Nutrition Goal Pt continues to defer a meeting with the RD at this time.          Psychosocial: Target Goals: Acknowledge presence or absence of significant depression and/or stress, maximize coping skills, provide positive support system. Participant is able to verbalize types and ability to use techniques and skills needed for reducing stress and depression.   Education: Stress, Anxiety, and Depression - Group verbal and visual presentation to define topics covered.  Reviews how body is impacted by stress, anxiety, and depression.  Also discusses healthy ways to reduce stress and to treat/manage anxiety and depression. Written material provided at class time.   Education: Sleep Hygiene -Provides group verbal and written instruction about how sleep can affect your health.  Define sleep hygiene, discuss sleep cycles and impact of sleep habits. Review good sleep hygiene tips.   Initial Review & Psychosocial Screening:  Initial Psych Review & Screening - 09/06/23 1015       Initial Review   Current issues with None Identified      Family Dynamics   Good Support System? Yes    Comments He has two daughters, a son and a girlfriend for support. He states no mental instability.      Barriers   Psychosocial barriers to participate in program There are no identifiable barriers or  psychosocial needs.;The patient should benefit from training in stress management and relaxation.      Screening Interventions  Interventions Encouraged to exercise;To provide support and resources with identified psychosocial needs;Provide feedback about the scores to participant    Expected Outcomes Short Term goal: Utilizing psychosocial counselor, staff and physician to assist with identification of specific Stressors or current issues interfering with healing process. Setting desired goal for each stressor or current issue identified.;Long Term Goal: Stressors or current issues are controlled or eliminated.;Short Term goal: Identification and review with participant of any Quality of Life or Depression concerns found by scoring the questionnaire.;Long Term goal: The participant improves quality of Life and PHQ9 Scores as seen by post scores and/or verbalization of changes          Quality of Life Scores:   Quality of Life - 02/12/24 1454       Quality of Life Scores   Health/Function Pre 11 %    Health/Function Post 22.43 %    Health/Function % Change 103.91 %    Socioeconomic Pre 24.75 %    Socioeconomic Post 27.75 %    Socioeconomic % Change  12.12 %    Psych/Spiritual Pre 13.71 %    Psych/Spiritual Post 27.43 %    Psych/Spiritual % Change 100.07 %    Family Pre 26.4 %    Family Post 28.8 %    Family % Change 9.09 %    GLOBAL Pre 16.89 %    GLOBAL Post 25.56 %    GLOBAL % Change 51.33 %         Scores of 19 and below usually indicate a poorer quality of life in these areas.  A difference of  2-3 points is a clinically meaningful difference.  A difference of 2-3 points in the total score of the Quality of Life Index has been associated with significant improvement in overall quality of life, self-image, physical symptoms, and general health in studies assessing change in quality of life.  PHQ-9: Review Flowsheet       02/12/2024 10/28/2023 09/17/2023  Depression screen PHQ  2/9  Decreased Interest 0 0 1  Down, Depressed, Hopeless 0 0 1  PHQ - 2 Score 0 0 2  Altered sleeping 1 1 1   Tired, decreased energy 3 2 3   Change in appetite 2 0 1  Feeling bad or failure about yourself  0 0 2  Trouble concentrating 0 0 0  Moving slowly or fidgety/restless 0 0 0  Suicidal thoughts 0 0 0  PHQ-9 Score 6 3  9    Difficult doing work/chores Not difficult at all Not difficult at all Very difficult    Details       Data saved with a previous flowsheet row definition        Interpretation of Total Score  Total Score Depression Severity:  1-4 = Minimal depression, 5-9 = Mild depression, 10-14 = Moderate depression, 15-19 = Moderately severe depression, 20-27 = Severe depression   Psychosocial Evaluation and Intervention:  Psychosocial Evaluation - 09/06/23 1017       Psychosocial Evaluation & Interventions   Interventions Encouraged to exercise with the program and follow exercise prescription;Relaxation education;Stress management education    Comments He has two daughters, a son and a girlfriend for support. He states no mental instability.    Expected Outcomes Short: Start HeartTrack to help with mood. Long: Maintain a healthy mental state    Continue Psychosocial Services  Follow up required by staff          Psychosocial Re-Evaluation:  Psychosocial Re-Evaluation     Row Name  10/28/23 1507 12/04/23 1408 01/13/24 1403 02/17/24 1410       Psychosocial Re-Evaluation   Current issues with None Identified Current Sleep Concerns Current Sleep Concerns;Current Stress Concerns Current Sleep Concerns    Comments Theodore Wiggins states he does not have any current stressors. He states he does not sleep great because he gets up to go to the bathroom 2-3 times a night, but does sleep soundly. He states he has a strong support system of his girlfriend, 2 daughters, and son that he can easily look to for support. He states he manages his stressors well and often will turn  everything off and get out his bible and read. His PHQ score was reassessed today and it went down to 3. Theodore Wiggins states that he continues to have a strong support system and feels blessed with his family and friends. He reports he is still not sleeping well and sometimes has low energy during the day. He was encouraged to talk to his doctor if this continues to be a concerns. Reports no concerns with stress, or mental health. Theodore Wiggins states that he has had some stress lately due to some friends passing away. He has been going to some christmas parties lately which he has enjoyed, but also has made him tired. He reports not sleeping well due to waking up throughout the night to use the restroom. He talked to his doctor about this issue and they instructed him not to drink too many fluids after dinner. He overall is doing well with his mental health. Theodore Wiggins denies any anxiety, stress or depression at this time. He says he has been busy with holidays and is tried. But doesnt sleep much late at night, instead often wakes up early in morning then falls back asleep. says that where he gets his best sleep.    Expected Outcomes Short: Continue to manage stressors that arise. Long: Continue to maintain a positive outlook on life. Short: continue to attend cardiac rehab for mental health benefits of exercise. Talk to doctor about sleep concerns.  Long: maintain good mental health routine. Short: Drink less after dinner to help with sleep concerns as instructed by the doctor.  Long: Maintain positive outlook. STG: Focus on good sleep hygiene and exercise for stress relief. LTG: Maintain positive outlook    Interventions Encouraged to attend Cardiac Rehabilitation for the exercise Encouraged to attend Cardiac Rehabilitation for the exercise Encouraged to attend Cardiac Rehabilitation for the exercise Encouraged to attend Cardiac Rehabilitation for the exercise    Continue Psychosocial Services  Follow up required by staff  Follow up required by staff Follow up required by staff Follow up required by staff       Psychosocial Discharge (Final Psychosocial Re-Evaluation):  Psychosocial Re-Evaluation - 02/17/24 1410       Psychosocial Re-Evaluation   Current issues with Current Sleep Concerns    Comments Trev denies any anxiety, stress or depression at this time. He says he has been busy with holidays and is tried. But doesnt sleep much late at night, instead often wakes up early in morning then falls back asleep. says that where he gets his best sleep.    Expected Outcomes STG: Focus on good sleep hygiene and exercise for stress relief. LTG: Maintain positive outlook    Interventions Encouraged to attend Cardiac Rehabilitation for the exercise    Continue Psychosocial Services  Follow up required by staff          Vocational Rehabilitation: Provide vocational rehab assistance to  qualifying candidates.   Vocational Rehab Evaluation & Intervention:   Education: Education Goals: Education classes will be provided on a variety of topics geared toward better understanding of heart health and risk factor modification. Participant will state understanding/return demonstration of topics presented as noted by education test scores.  Learning Barriers/Preferences:  Learning Barriers/Preferences - 09/06/23 1014       Learning Barriers/Preferences   Learning Barriers None    Learning Preferences None          General Cardiac Education Topics:  AED/CPR: - Group verbal and written instruction with the use of models to demonstrate the basic use of the AED with the basic ABC's of resuscitation.   Test and Procedures: - Group verbal and visual presentation and models provide information about basic cardiac anatomy and function. Reviews the testing methods done to diagnose heart disease and the outcomes of the test results. Describes the treatment choices: Medical Management, Angioplasty, or Coronary  Bypass Surgery for treating various heart conditions including Myocardial Infarction, Angina, Valve Disease, and Cardiac Arrhythmias. Written material provided at class time.   Medication Safety: - Group verbal and visual instruction to review commonly prescribed medications for heart and lung disease. Reviews the medication, class of the drug, and side effects. Includes the steps to properly store meds and maintain the prescription regimen. Written material provided at class time.   Intimacy: - Group verbal instruction through game format to discuss how heart and lung disease can affect sexual intimacy. Written material provided at class time. Flowsheet Row Cardiac Rehab from 12/04/2023 in [facility] Cardiac and Pulmonary Rehab  Date 11/20/23  Educator mb  Instruction Review Code 1- Verbalizes Understanding    Know Your Numbers and Heart Failure: - Group verbal and visual instruction to discuss disease risk factors for cardiac and pulmonary disease and treatment options.  Reviews associated critical values for Overweight/Obesity, Hypertension, Cholesterol, and Diabetes.  Discusses basics of heart failure: signs/symptoms and treatments.  Introduces Heart Failure Zone chart for action plan for heart failure. Written material provided at class time. Flowsheet Row Cardiac Rehab from 12/04/2023 in [facility] Cardiac and Pulmonary Rehab  Date 10/16/23  Educator mc  Instruction Review Code 1- Verbalizes Understanding    Infection Prevention: - Provides verbal and written material to individual with discussion of infection control including proper hand washing and proper equipment cleaning during exercise session. Flowsheet Row Cardiac Rehab from 12/04/2023 in [facility] Cardiac and Pulmonary Rehab  Date 09/17/23  Educator [facility]  Instruction Review Code 1- Verbalizes Understanding    Falls Prevention: - Provides verbal and written material to individual with discussion of falls prevention and safety. Flowsheet  Row Cardiac Rehab from 12/04/2023 in [facility] Cardiac and Pulmonary Rehab  Date 09/17/23  Educator [facility]  Instruction Review Code 1- Verbalizes Understanding    Other: -Provides group and verbal instruction on various topics (see comments)   Knowledge Questionnaire Score:  Knowledge Questionnaire Score - 02/12/24 1454       Knowledge Questionnaire Score   Pre Score 26    Post Score 25/26          Core Components/Risk Factors/Patient Goals at Admission:  Personal Goals and Risk Factors at Admission - 09/06/23 1014       Core Components/Risk Factors/Patient Goals on Admission    Weight Management Yes;Weight Loss    Intervention Weight Management: Develop a combined nutrition and exercise program designed to reach desired caloric intake, while maintaining appropriate intake of nutrient and fiber, sodium and fats, and appropriate  energy expenditure required for the weight goal.;Weight Management: Provide education and appropriate resources to help participant work on and attain dietary goals.;Weight Management/Obesity: Establish reasonable short term and long term weight goals.    Expected Outcomes Short Term: Continue to assess and modify interventions until short term weight is achieved;Long Term: Adherence to nutrition and physical activity/exercise program aimed toward attainment of established weight goal;Weight Loss: Understanding of general recommendations for a balanced deficit meal plan, which promotes 1-2 lb weight loss per week and includes a negative energy balance of [phone] kcal/d;Understanding recommendations for meals to include 15-35% energy as protein, 25-35% energy from fat, 35-60% energy from carbohydrates, less than 200mg  of dietary cholesterol, 20-35 gm of total fiber daily;Understanding of distribution of calorie intake throughout the day with the consumption of 4-5 meals/snacks    Hypertension Yes    Intervention Provide education on lifestyle modifcations including  regular physical activity/exercise, weight management, moderate sodium restriction and increased consumption of fresh fruit, vegetables, and low fat dairy, alcohol  moderation, and smoking cessation.;Monitor prescription use compliance.    Expected Outcomes Short Term: Continued assessment and intervention until BP is < 140/87mm HG in hypertensive participants. < 130/31mm HG in hypertensive participants with diabetes, heart failure or chronic kidney disease.;Long Term: Maintenance of blood pressure at goal levels.    Lipids Yes    Intervention Provide education and support for participant on nutrition & aerobic/resistive exercise along with prescribed medications to achieve LDL 70mg , HDL >40mg .    Expected Outcomes Short Term: Participant states understanding of desired cholesterol values and is compliant with medications prescribed. Participant is following exercise prescription and nutrition guidelines.;Long Term: Cholesterol controlled with medications as prescribed, with individualized exercise RX and with personalized nutrition plan. Value goals: LDL < 70mg , HDL > 40 mg.          Education:Diabetes - Individual verbal and written instruction to review signs/symptoms of diabetes, desired ranges of glucose level fasting, after meals and with exercise. Acknowledge that pre and post exercise glucose checks will be done for 3 sessions at entry of program.   Core Components/Risk Factors/Patient Goals Review:   Goals and Risk Factor Review     Row Name 10/28/23 1510 12/04/23 1405 01/13/24 1408 02/17/24 1412       Core Components/Risk Factors/Patient Goals Review   Personal Goals Review Weight Management/Obesity;Hypertension Weight Management/Obesity;Lipids;Hypertension Weight Management/Obesity;Lipids;Hypertension Weight Management/Obesity;Hypertension    Review Deontray states he is monitoring his blood pressure at home with his wrist monitor and is getting similar readings as he does in rehab.  He states he is checking his weight daily and has a goal to get down to 190lb. He knows it will take discipline and be hard. He has switched to drinking water and will occasionally have a soft drink. He also started to split meals with his girlfriend when they go out to help with portion size. Lennart reports that he checks his weight and blood pressure at home. He reports that his weight is up and down slightly but mostly stays steady. She reports that he takes all her BP and cholesterol meds and follows up with his docotor for appointments and lab work. He wants to continue to work on weight loss with a goal of 190 lbs. Brayant states that he would like to lose a little more weight with a goal of getting under 200 lb. He states that when he weighs at home with no clothes that he is around 203 lb and is getting closer to his goal.  He owns a BP cuff at home and has been checking his BP only when he feels that he needs to check it. He also continues to take all of his medications as prescribed. Zimere reports he is weighing himself daily with readings showing between 205-210lbs. He wants to get under 200lbs. Spoke with him about heart healthy eating. He says he has not been checking his BP at home. He has cuff, encouraged him to keep up the habit of checking regularly.    Expected Outcomes Short: Cotinue monitoring weight and blood pressure at home. Long: Continue managing cardiovascular risk factors. Short: contine to work towards goal weight of 190 lbs. Continue to check BP at home. Long: control cardiac risk factors. Short: Contine to work towards weight goal. Long: Continue to manage lifestyle risk factors. STG: Get back into habit of checking BP at home. LTG: Continue to manage lifestyle risk factors.       Core Components/Risk Factors/Patient Goals at Discharge (Final Review):   Goals and Risk Factor Review - 02/17/24 1412       Core Components/Risk Factors/Patient Goals Review   Personal Goals  Review Weight Management/Obesity;Hypertension    Review Taylor reports he is weighing himself daily with readings showing between 205-210lbs. He wants to get under 200lbs. Spoke with him about heart healthy eating. He says he has not been checking his BP at home. He has cuff, encouraged him to keep up the habit of checking regularly.    Expected Outcomes STG: Get back into habit of checking BP at home. LTG: Continue to manage lifestyle risk factors.          ITP Comments:  ITP Comments     Row Name 09/06/23 1012 09/17/23 1510 09/23/23 1406 10/02/23 0955 10/30/23 1439   ITP Comments Virtual Visit completed. Patient informed on EP and RD appointment and 6 Minute walk test. Patient also informed of patient health questionnaires on My Chart. Patient Verbalizes understanding. Visit diagnosis can be found in [facility] 4//29/2025. Completed and gym orientation for cardiac rehab. Initial ITP created and sent for review to Dr. Oneil Pinal, Medical Director. First full day of exercise!  Patient was oriented to gym and equipment including functions, settings, policies, and procedures.  Patient's individual exercise prescription and treatment plan were reviewed.  All starting workloads were established based on the results of the 6 minute walk test done at initial orientation visit.  The plan for exercise progression was also introduced and progression will be customized based on patient's performance and goals. 30 Day review completed. Medical Director ITP review done; changes made as directed and signed approval by Medical Director. New to program. 30 Day review completed. Medical Director ITP review done; changes made as directed and signed approval by Medical Director.    Row Name 11/27/23 0946 12/25/23 1257 01/22/24 0927 02/19/24 1059     ITP Comments 30 Day review completed. Medical Director ITP review done; changes made as directed and signed approval by Medical Director. 30 Day review completed. Medical  Director ITP review done, changes made as directed, and signed approval by Medical Director. 30 Day review completed. Medical Director ITP review done, changes made as directed, and signed approval by Medical Director. 30 Day review completed. Medical Director ITP review done, changes made as directed, and signed approval by Medical Director.       Comments: 30 Day Review    [1]  Current Outpatient Medications:    aspirin  81 MG chewable tablet, Chew 81 mg  by mouth. (Patient not taking: Reported on 09/06/2023), Disp: , Rfl:    aspirin  81 MG chewable tablet, Chew 81 mg by mouth., Disp: , Rfl:    carvedilol (COREG) 6.25 MG tablet, Take 6.25 mg by mouth., Disp: , Rfl:    clopidogrel (PLAVIX) 75 MG tablet, Take 75 mg by mouth., Disp: , Rfl:    furosemide  (LASIX ) 20 MG tablet, Take 20 mg by mouth daily. (Patient not taking: Reported on 09/06/2023), Disp: , Rfl:    furosemide  (LASIX ) 40 MG tablet, Take 40 mg by mouth daily. (Patient not taking: Reported on 09/06/2023), Disp: , Rfl:    Multiple Vitamins-Minerals (CENTRUM SILVER 50+MEN PO), Take by mouth., Disp: , Rfl:    nitroGLYCERIN  (NITROSTAT ) 0.4 MG SL tablet, Place 0.4 mg under the tongue every 5 (five) minutes as needed for chest pain., Disp: , Rfl:    nitroGLYCERIN  (NITROSTAT ) 0.4 MG SL tablet, Place 0.4 mg under the tongue. (Patient not taking: Reported on 09/06/2023), Disp: , Rfl:    omeprazole (PRILOSEC) 20 MG capsule, Take 20 mg by mouth daily. (Patient not taking: Reported on 09/06/2023), Disp: , Rfl:    pantoprazole (PROTONIX) 40 MG tablet, Take 40 mg by mouth., Disp: , Rfl:    rosuvastatin (CRESTOR) 20 MG tablet, Take 1 tablet by mouth at bedtime., Disp: , Rfl:    sacubitril-valsartan (ENTRESTO) 24-26 MG, Take 1 tablet by mouth every 12 (twelve) hours., Disp: , Rfl:    spironolactone (ALDACTONE) 25 MG tablet, Take 12.5 mg by mouth., Disp: , Rfl:    ticagrelor (BRILINTA) 90 MG TABS tablet, Take 90 mg by mouth 2 (two) times daily. (Patient not  taking: Reported on 09/06/2023), Disp: , Rfl:  [2]  Social History Tobacco Use  Smoking Status Never  Smokeless Tobacco Current   Types: Snuff

## 2024-02-19 NOTE — Progress Notes (Signed)
 Daily Session Note  Patient Details  Name: Theodore Wiggins MRN: [id number] Date of Birth: 10-20-1945 Referring Provider:   Flowsheet Row Cardiac Rehab from 09/17/2023 in [facility]  Referring Provider Dr. Keller Paterson    Encounter Date: 02/19/2024  Check In:  Session Check In - 02/19/24 1402       Check-In   Supervising physician immediately available to respond to emergencies See telemetry face sheet for immediately available ER MD    Location [facility]-Cardiac & Pulmonary Rehab    Staff Present Burnard Davenport RN,BSN,MPA;Laura Cates RN,BSN;Marcella Charlson Dyane BS, ACSM CEP, Exercise Physiologist;Noah Tickle, BS, Exercise Physiologist    Virtual Visit No    Medication changes reported     No    Fall or balance concerns reported    No    Tobacco Cessation No Change    Warm-up and Cool-down Performed on first and last piece of equipment    Resistance Training Performed Yes    VAD Patient? No    PAD/SET Patient? No      Pain Assessment   Currently in Pain? No/denies             Tobacco Use History[1]  Goals Met:  Proper associated with RPD/PD & O2 Sat Independence with exercise equipment Exercise tolerated well No report of concerns or symptoms today Strength training completed today  Goals Unmet:  Not Applicable  Comments: Pt able to follow exercise prescription today without complaint.  Will continue to monitor for progression.    Dr. Oneil Pinal is Medical Director for [facility] Card[facility] Rehabilitation.  Dr. Fuad Aleskerov is Medical Director for [facility] Pulmonary Rehabilitation.    [1]  Social History Tobacco Use  Smoking Status Never  Smokeless Tobacco Current   Types: Snuff

## 2024-02-24 ENCOUNTER — Encounter: Admitting: Emergency Medicine

## 2024-02-24 DIAGNOSIS — Z955 Presence of coronary angioplasty implant and graft: Secondary | ICD-10-CM

## 2024-02-24 DIAGNOSIS — I214 Non-ST elevation (NSTEMI) myocardial infarction: Secondary | ICD-10-CM

## 2024-02-24 DIAGNOSIS — Z48812 Encounter for surgical aftercare following surgery on the circulatory system: Secondary | ICD-10-CM | POA: Diagnosis not present

## 2024-02-24 NOTE — Progress Notes (Signed)
 Cardiac Individual Treatment Plan  Patient Details  Name: Theodore Wiggins MRN: [id number] Date of Birth: March 09, 1945 Referring Provider:   Flowsheet Row Cardiac Rehab from 09/17/2023 in [facility] Cardiac and Pulmonary Rehab  Referring Provider Dr. Keller Paterson    Initial Encounter Date:  Flowsheet Row Cardiac Rehab from 09/17/2023 in [facility] Cardiac and Pulmonary Rehab  Date 09/17/23    Visit Diagnosis: NSTEMI (non-ST elevated myocardial infarction) [facility])  Status post coronary artery stent placement  Patient's Home Medications on Admission: Current Medications[1]  Past Medical History: Past Medical History:  Diagnosis Date   Arthritis    Coronary artery disease    DDD (degenerative disc disease), lumbar    DDD (degenerative disc disease), lumbar    Depression    Dyspnea    GERD (gastroesophageal reflux disease)    Hyperlipidemia    Hypertension    NSTEMI (non-ST elevated myocardial infarction) (HCC) 06/04/2023   Post herpetic neuralgia    Vertigo     Tobacco Use: Tobacco Use History[2]  Labs: Review Flowsheet       Latest Ref Rng & Units 10/01/2017 04/29/2020 06/04/2023  Labs for ITP Cardiac and Pulmonary Rehab  Cholestrol 0 - 200 mg/dL - - 885   LDL (calc) 0 - 99 mg/dL - - 63   HDL-C >59 mg/dL - - 34   Trlycerides <849 mg/dL - - 84   Hemoglobin J8r 4.8 - 5.6 % 5.5  - 5.5   O2 Saturation % - 97.1  -     Exercise Target Goals: Exercise Program Goal: Individual exercise prescription set using results from initial 6 min walk test and THRR while considering  patients activity barriers and safety.   Exercise Prescription Goal: Initial exercise prescription builds to 30-45 minutes a day of aerobic activity, 2-3 days per week.  Home exercise guidelines will be given to patient during program as part of exercise prescription that the participant will acknowledge.   Education: Aerobic Exercise: - Group verbal and visual presentation on the components of exercise  prescription. Introduces F.I.T.T principle from ACSM for exercise prescriptions.  Reviews F.I.T.T. principles of aerobic exercise including progression. Written material provided at class time. Flowsheet Row Cardiac Rehab from 12/04/2023 in [facility] Cardiac and Pulmonary Rehab  Education need identified 09/17/23  Date 11/20/23  Educator mb  Instruction Review Code 1- Verbalizes Understanding    Education: Resistance Exercise: - Group verbal and visual presentation on the components of exercise prescription. Introduces F.I.T.T principle from ACSM for exercise prescriptions  Reviews F.I.T.T. principles of resistance exercise including progression. Written material provided at class time.    Education: Exercise & Equipment Safety: - Individual verbal instruction and demonstration of equipment use and safety with use of the equipment. Flowsheet Row Cardiac Rehab from 12/04/2023 in [facility] Cardiac and Pulmonary Rehab  Date 09/17/23  Educator [facility]  Instruction Review Code 1- Verbalizes Understanding    Education: Exercise Physiology & General Exercise Guidelines: - Group verbal and written instruction with models to review the exercise physiology of the cardiovascular system and associated critical values. Provides general exercise guidelines with specific guidelines to those with heart or lung disease. Written material provided at class time. Flowsheet Row Cardiac Rehab from 12/04/2023 in [facility] Cardiac and Pulmonary Rehab  Education need identified 09/17/23    Education: Flexibility, Balance, Mind/Body Relaxation: - Group verbal and visual presentation with interactive activity on the components of exercise prescription. Introduces F.I.T.T principle from ACSM for exercise prescriptions. Reviews F.I.T.T. principles of flexibility and balance exercise training  including progression. Also discusses the mind body connection.  Reviews various relaxation techniques to help reduce and manage stress (i.e. Deep  breathing, progressive muscle relaxation, and visualization). Balance handout provided to take home. Written material provided at class time.   Activity Barriers & Risk Stratification:   6 Minute Walk:  6 Minute Walk     Row Name 09/17/23 1511 02/10/24 1411       6 Minute Walk   Phase Initial Discharge    Distance 920 feet 1145 feet    Distance % Change -- 24.5 %    Distance Feet Change -- 225 ft    Walk Time 6 minutes 6 minutes    # of Rest Breaks 0 0    MPH 1.74 2.17    METS 1.5 2.22    RPE 13 13    Perceived Dyspnea  2.5 1    VO2 Peak 5.25 7.77    Symptoms Yes (comment) Yes (comment)    Comments SOB used cane    Resting HR 62 bpm 79 bpm    Resting BP 122/70 134/82    Resting Oxygen Saturation  97 % 97 %    Exercise Oxygen Saturation  during 6 min walk 100 % 95 %    Max Ex. HR 75 bpm 94 bpm    Max Ex. BP 138/64 166/88    2 Minute Post BP 116/66 --       Oxygen Initial Assessment:   Oxygen Re-Evaluation:   Oxygen Discharge (Final Oxygen Re-Evaluation):   Initial Exercise Prescription:  Initial Exercise Prescription - 09/17/23 1500       Date of Initial Exercise RX and Referring Provider   Date 09/17/23    Referring Provider Dr. Keller Paterson      Oxygen   Maintain Oxygen Saturation 88% or higher      Treadmill   MPH 1.7    Grade 0    Minutes 15    METs 2.3      Recumbant Bike   Level 2    RPM 50    Watts 25    Minutes 15    METs 1.5      T5 Nustep   Level 2    SPM 80    Minutes 15    METs 1.5      Track   Laps 24    Minutes 15    METs 2.31      Prescription Details   Duration Progress to 30 minutes of continuous aerobic without signs/symptoms of physical distress      Intensity   THRR 40-80% of Max Heartrate 94-126    Ratings of Perceived Exertion 11-13    Perceived Dyspnea 0-4      Progression   Progression Continue to progress workloads to maintain intensity without signs/symptoms of physical distress.      Resistance  Training   Training Prescription Yes    Weight 4lb    Reps 10-15          Perform Capillary Blood Glucose checks as needed.  Exercise Prescription Changes:   Exercise Prescription Changes     Row Name 09/17/23 1500 10/09/23 1500 10/24/23 1300 11/06/23 0800 11/27/23 1400     Response to Exercise   Blood Pressure (Admit) 122/70 142/80 124/82 130/84 --   Blood Pressure (Exercise) 138/64 146/86 120/80 134/78 --   Blood Pressure (Exit) 116/66 120/64 110/62 122/66 --   Heart Rate (Admit) 62 bpm 67 bpm 77 bpm 76 bpm --  Heart Rate (Exercise) 75 bpm 95 bpm 90 bpm 100 bpm --   Heart Rate (Exit) 61 bpm 76 bpm 74 bpm 76 bpm --   Oxygen Saturation (Admit) 97 % -- -- -- --   Oxygen Saturation (Exercise) 100 % -- -- -- --   Oxygen Saturation (Exit) 100 % -- -- -- --   Rating of Perceived Exertion (Exercise) 13 13 15 15  --   Perceived Dyspnea (Exercise) 2.5 -- -- -- --   Symptoms SOB none none none --   Comments results 1st 2 weeks of exercise -- -- --   Duration -- Progress to 30 minutes of  aerobic without signs/symptoms of physical distress Continue with 30 min of aerobic exercise without signs/symptoms of physical distress. Continue with 30 min of aerobic exercise without signs/symptoms of physical distress. Continue with 30 min of aerobic exercise without signs/symptoms of physical distress.   Intensity -- THRR unchanged THRR unchanged THRR unchanged THRR unchanged     Progression   Progression -- Continue to progress workloads to maintain intensity without signs/symptoms of physical distress. Continue to progress workloads to maintain intensity without signs/symptoms of physical distress. Continue to progress workloads to maintain intensity without signs/symptoms of physical distress. Continue to progress workloads to maintain intensity without signs/symptoms of physical distress.   Average METs -- 2.24 2.25 2.62 2.62     Resistance Training   Training Prescription -- Yes Yes Yes  Yes   Weight -- 4lb 4 lb 4 lb 4 lb   Reps -- 10-15 10-15 10-15 10-15     Interval Training   Interval Training -- No No No No     Oxygen   Oxygen -- Continuous -- -- --     Treadmill   MPH -- 2 1.7 2 2    Grade -- 0 0 0 0   Minutes -- 15 15 15 15    METs -- 2.53 2.3 2.53 2.53     Recumbant Bike   Level -- 3 -- -- --   Watts -- 25 -- -- --   Minutes -- 15 -- -- --   METs -- 2.83 -- -- --     T5 Nustep   Level -- 5 5 5 5    Minutes -- 15 15 15 15    METs -- 2 2.2 2.4 2.4     Biostep-RELP   Level -- -- -- 7 7   Minutes -- -- -- 15 15   METs -- -- -- 3 3     Home Exercise Plan   Plans to continue exercise at -- -- -- -- Home (comment)  TM, rower, walking   Frequency -- -- -- -- Add 2 additional days to program exercise sessions.   Initial Home Exercises Provided -- -- -- -- 11/27/23     Oxygen   Maintain Oxygen Saturation -- 88% or higher 88% or higher 88% or higher 88% or higher    Row Name 12/04/23 1600 12/18/23 0800 12/30/23 1100 01/15/24 1100 02/04/24 1500     Response to Exercise   Blood Pressure (Admit) 120/78 130/72 132/84 150/82 118/76   Blood Pressure (Exercise) 148/80 -- -- -- --   Blood Pressure (Exit) 122/70 130/72 128/80 122/72 116/70   Heart Rate (Admit) 60 bpm 81 bpm 76 bpm 71 bpm 76 bpm   Heart Rate (Exercise) 101 bpm 104 bpm 95 bpm 92 bpm 102 bpm   Heart Rate (Exit) 88 bpm 72 bpm 75 bpm 76 bpm 93 bpm  Oxygen Saturation (Admit) -- -- 98 % 98 % 96 %   Oxygen Saturation (Exercise) -- -- 95 % 96 % 92 %   Oxygen Saturation (Exit) -- -- 94 % 98 % 96 %   Rating of Perceived Exertion (Exercise) 15 15 15 15 13    Perceived Dyspnea (Exercise) -- -- -- 3 --   Symptoms none none none none none   Duration Continue with 30 min of aerobic exercise without signs/symptoms of physical distress. Continue with 30 min of aerobic exercise without signs/symptoms of physical distress. Continue with 30 min of aerobic exercise without signs/symptoms of physical distress.  Continue with 30 min of aerobic exercise without signs/symptoms of physical distress. Continue with 30 min of aerobic exercise without signs/symptoms of physical distress.   Intensity THRR unchanged THRR unchanged THRR unchanged THRR unchanged THRR unchanged     Progression   Progression Continue to progress workloads to maintain intensity without signs/symptoms of physical distress. Continue to progress workloads to maintain intensity without signs/symptoms of physical distress. Continue to progress workloads to maintain intensity without signs/symptoms of physical distress. Continue to progress workloads to maintain intensity without signs/symptoms of physical distress. Continue to progress workloads to maintain intensity without signs/symptoms of physical distress.   Average METs 2.99 3.22 3.42 3.29 3.21     Resistance Training   Training Prescription Yes Yes Yes Yes Yes   Weight 4 lb 4 lb 4 lb 4 lb 7 lb   Reps 10-15 10-15 10-15 10-15 10-15     Interval Training   Interval Training No No No No No     Treadmill   MPH 2 2 2  1.8 1.7   Grade 3 4 4 4  6.5   Minutes 15 15 15 15 15    METs 3.36 3.63 3.63 3.37 3.83     NuStep   Level 5  T6 -- -- 7 8  T6   SPM 80 -- -- -- --   Minutes 15 -- -- 15 15   METs 2.3 -- -- 4 3.5     T5 Nustep   Level 3 4 4  -- 5  T4 and T5   Minutes 15 15 15  -- 15   METs 2.2 2.4 -- -- 3.3     Biostep-RELP   Level 8 5 5 6 5    Minutes 15 15 15 15 15    METs 4 4 4 2 4      Home Exercise Plan   Plans to continue exercise at Home (comment)  TM, rower, walking Home (comment)  TM, rower, walking Home (comment)  TM, rower, walking Home (comment)  TM, rower, walking Home (comment)  TM, rower, walking   Frequency Add 2 additional days to program exercise sessions. Add 2 additional days to program exercise sessions. Add 2 additional days to program exercise sessions. Add 2 additional days to program exercise sessions. Add 2 additional days to program exercise sessions.    Initial Home Exercises Provided 11/27/23 11/27/23 11/27/23 11/27/23 11/27/23     Oxygen   Maintain Oxygen Saturation 88% or higher 88% or higher 88% or higher 88% or higher 88% or higher    Row Name 02/19/24 1600             Response to Exercise   Blood Pressure (Admit) 136/82       Blood Pressure (Exit) 122/72       Heart Rate (Admit) 74 bpm       Heart Rate (Exercise) 92 bpm  Heart Rate (Exit) 81 bpm       Oxygen Saturation (Admit) 98 %       Oxygen Saturation (Exercise) 96 %       Oxygen Saturation (Exit) 96 %       Rating of Perceived Exertion (Exercise) 13       Symptoms none       Duration Continue with 30 min of aerobic exercise without signs/symptoms of physical distress.       Intensity THRR unchanged         Progression   Progression Continue to progress workloads to maintain intensity without signs/symptoms of physical distress.       Average METs 2.86         Resistance Training   Weight 7 lb       Reps 10-15         Interval Training   Interval Training No         Treadmill   MPH 1.7       Grade 3.5       Minutes 15       METs 3.12         T5 Nustep   Level 6       Minutes 15       METs 2.6         Home Exercise Plan   Plans to continue exercise at Home (comment)  TM, rower, walking       Frequency Add 2 additional days to program exercise sessions.       Initial Home Exercises Provided 11/27/23         Oxygen   Maintain Oxygen Saturation 88% or higher          Exercise Comments:   Exercise Comments     Row Name 09/23/23 1406           Exercise Comments First full day of exercise!  Patient was oriented to gym and equipment including functions, settings, policies, and procedures.  Patient's individual exercise prescription and treatment plan were reviewed.  All starting workloads were established based on the results of the 6 minute walk test done at initial orientation visit.  The plan for exercise progression was also introduced and  progression will be customized based on patient's performance and goals.          Exercise Goals and Review:   Exercise Goals     Row Name 09/17/23 1515             Exercise Goals   Increase Physical Activity Yes       Intervention Provide advice, education, support and counseling about physical activity/exercise needs.;Develop an individualized exercise prescription for aerobic and resistive training based on initial evaluation findings, risk stratification, comorbidities and participant's personal goals.       Expected Outcomes Short Term: Attend rehab on a regular basis to increase amount of physical activity.;Long Term: Add in home exercise to make exercise part of routine and to increase amount of physical activity.;Long Term: Exercising regularly at least 3-5 days a week.       Increase Strength and Stamina Yes       Intervention Provide advice, education, support and counseling about physical activity/exercise needs.;Develop an individualized exercise prescription for aerobic and resistive training based on initial evaluation findings, risk stratification, comorbidities and participant's personal goals.       Expected Outcomes Short Term: Increase workloads from initial exercise prescription for resistance, speed, and METs.;Short Term:  Perform resistance training exercises routinely during rehab and add in resistance training at home;Long Term: Improve cardiorespiratory fitness, muscular endurance and strength as measured by increased METs and functional capacity ( )       Able to understand and use rate of perceived exertion (RPE) scale Yes       Intervention Provide education and explanation on how to use RPE scale       Expected Outcomes Short Term: Able to use RPE daily in rehab to express subjective intensity level;Long Term:  Able to use RPE to guide intensity level when exercising independently       Able to understand and use Dyspnea scale Yes       Intervention Provide  education and explanation on how to use Dyspnea scale       Expected Outcomes Short Term: Able to use Dyspnea scale daily in rehab to express subjective sense of shortness of breath during exertion;Long Term: Able to use Dyspnea scale to guide intensity level when exercising independently       Knowledge and understanding of Target Heart Rate Range (THRR) Yes       Intervention Provide education and explanation of THRR including how the numbers were predicted and where they are located for reference       Expected Outcomes Short Term: Able to use daily as guideline for intensity in rehab;Short Term: Able to state/look up THRR;Long Term: Able to use THRR to govern intensity when exercising independently       Able to check pulse independently Yes       Intervention Provide education and demonstration on how to check pulse in carotid and radial arteries.;Review the importance of being able to check your own pulse for safety during independent exercise       Expected Outcomes Short Term: Able to explain why pulse checking is important during independent exercise;Long Term: Able to check pulse independently and accurately       Understanding of Exercise Prescription Yes       Intervention Provide education, explanation, and written materials on patient's individual exercise prescription       Expected Outcomes Short Term: Able to explain program exercise prescription;Long Term: Able to explain home exercise prescription to exercise independently          Exercise Goals Re-Evaluation :  Exercise Goals Re-Evaluation     Row Name 09/23/23 1407 10/09/23 1538 10/24/23 1355 11/06/23 0825 11/21/23 1138     Exercise Goal Re-Evaluation   Exercise Goals Review Increase Physical Activity;Able to understand and use rate of perceived exertion (RPE) scale;Knowledge and understanding of Target Heart Rate Range (THRR);Understanding of Exercise Prescription;Increase Strength and Stamina;Able to understand and use  Dyspnea scale;Able to check pulse independently Increase Physical Activity;Understanding of Exercise Prescription;Increase Strength and Stamina Increase Physical Activity;Understanding of Exercise Prescription;Increase Strength and Stamina Increase Physical Activity;Understanding of Exercise Prescription;Increase Strength and Stamina Increase Physical Activity;Understanding of Exercise Prescription;Increase Strength and Stamina   Comments Reviewed RPE and dyspnea scale, THR and program prescription with pt today.  Pt voiced understanding and was given a copy of goals to take home. Khye is off to a good start in the program and completed his first 2 weeks of exercise sessions in this review. He worked at level 5 on the T5 nustep and level 3 on the recumbent bike. He had a workload on the treadmill of a speed of 2 mph and no incline. We will continue to monitor his progress in the program. [name] only attended  two sessions since the last review. He continues to walk the treadmill but lowered his speed from 2 mph to 1.7 mph with no incline. He also has consistently worked at level 5 on the T5 nustep. We will continue to monitor his progress in the program. [name] only attended two sessions since the last review. He increased his treadmill workload back up to a speed of 2 mph with no incline. He also began using the biostep and did well at level 7. We will continue to monitor his progress in the program. Tramayne did not attend rehab during the last review period due to sickness. He has since returned to exercise in the program. We will continue to monitor his progress in the program.   Expected Outcomes Short: Use RPE daily to regulate intensity.  Long: Follow program prescription in THR. Short: Continue to follow current exercise prescription. Long: Continue exercise to improve strength and stamina. Short: Attend rehab more consistently. Long: Continue exercise to improve strength and stamina. Short: Attend  rehab more consistently. Long: Continue exercise to improve strength and stamina. Short: Return to regular attendance in the program. Long: Continue exercise to improve strength and stamina.    Row Name 11/27/23 1403 12/04/23 1355 12/04/23 1626 12/18/23 0839 12/30/23 1139     Exercise Goal Re-Evaluation   Exercise Goals Review Increase Physical Activity;Increase Strength and Stamina;Able to understand and use rate of perceived exertion (RPE) scale;Able to understand and use Dyspnea scale;Knowledge and understanding of Target Heart Rate Range (THRR);Able to check pulse independently;Understanding of Exercise Prescription Increase Strength and Stamina;Understanding of Exercise Prescription Increase Strength and Stamina;Understanding of Exercise Prescription;Increase Physical Activity Increase Strength and Stamina;Understanding of Exercise Prescription;Increase Physical Activity Increase Strength and Stamina;Understanding of Exercise Prescription;Increase Physical Activity   Comments Reviewed home exercise with pt today.  Pt plans to use rower, treadmill, and walking outside for exercise.  Reviewed THR, pulse, RPE, sign and symptoms, pulse oximetery and when to call 911 or MD.  Also discussed weather considerations and indoor options.  Pt voiced understanding. 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 He has not yet added a day of structured exercise at home but continues to stay active. He was encouraged to start at least one day of home exericse. He does have equipment he can use at home. Casmer is doing well in rehab. He was able to increase to level 8 on the biostep and worked at level 5 on the T6 nustep. He also increased his workload on the treadmill to a speed of 2 mph and incline of 3%. We will continue to monitor his progress in the program. Whyatt is doing well in rehab. He was able to increase back up to level 4 on the T5 nustep. He also  increased his workload on the treadmill to an incline of 4% while maintaining his speed at 2 mph. We will continue to monitor his progress in the program. Jermine is doing well in rehab. He maintained a speed of 2 mph on the treadmill with 4% incline. He also maintained level 5 on the biostep and level 4 on the T5 nustep. We will continue to monitor his progress in the program.   Expected Outcomes Short: add 1-2 days a week of exercise at home on off days of rehab. Long: maintain independent exercise routine upon graduation from cardiac rehab. Short: continue to stay active at home and add 1-2 days a week of structured exercise at  home on off days of rehab. Long: maintain independent exercise routine upon graduation from cardiac rehab. Short: Continue to progressively increase treadmill and biostep workload. Long: Continue exercise to improve strength and stamina. Short: Continue to progressively increase workloads. Long: Continue exercise to improve strength and stamina. Short: Continue to progressively increase treadmill and nustep workloads. Long: Continue exercise to improve strength and stamina.    Row Name 01/06/24 1458 01/15/24 1157 02/04/24 1513 02/17/24 1408 02/19/24 1621     Exercise Goal Re-Evaluation   Exercise Goals Review Increase Strength and Stamina;Understanding of Exercise Prescription;Increase Physical Activity Increase Physical Activity;Understanding of Exercise Prescription;Increase Strength and Stamina Increase Physical Activity;Understanding of Exercise Prescription;Increase Strength and Stamina Increase Physical Activity;Increase Strength and Stamina;Understanding of Exercise Prescription Increase Physical Activity;Increase Strength and Stamina;Understanding of Exercise Prescription   Comments Giang states he has not had time to do his home exercise because he has been extremely busy especially with the holiday season. He does do a lot of walking in the activities that are keeping  him busy. He has also gotten back to doing some yardwork. We discussed setting a goal of trying to exercise 1 day a week at home. Anjelo continues to do well in rehab. He is due for his post in the next review period and hopes to improve. He increased all of his workloads. He increased his workload on the treadmill to a speed of 1.49mph and 4% incline. He also increased to level 7 on the T4 nustep and level 6 on the biostep. We will continue to monitor his progress in the program. Schawn continues to do well in rehab. He is still due for his post . He increased to level 8 on the T6 nustep, level 5 on the T5 nustep, and increased to 7 lb handweights. He also increased his workload on the treadmill to a speed of 1.7 mph and incline of 6.5%. We will continue to monitor his progress in the program. Raun is doing well in rehab, he is active at home. Not doing any intentional exercise outside of rehab. Encourage him to look to add more cardio exercise at home. Ladarrious continues to do well in rehab. He completed his post and improved by 24.5%. He increased to level 6 on the T5 nustep. He walked at a speed of 1.7 mph on the treadmill with an incline of 3.5%. We will continue to monitor his progress in the program.   Expected Outcomes Short: Exercise at home 1 day a week. Long: Continue to exercise independently. Short: Improve on post . Long: Continue to increase overall METs and stamina. Short: Improve on post . Long: Continue to increase overall METs and stamina. STG: Add home exercise. LTG: Continue to increase overall METs and stamina. Short: Continue to progressively increase treadmill workload. Long: Continue exercise to improve strength and stamina.      Discharge Exercise Prescription (Final Exercise Prescription Changes):  Exercise Prescription Changes - 02/19/24 1600       Response to Exercise   Blood Pressure (Admit) 136/82    Blood Pressure (Exit) 122/72    Heart Rate  (Admit) 74 bpm    Heart Rate (Exercise) 92 bpm    Heart Rate (Exit) 81 bpm    Oxygen Saturation (Admit) 98 %    Oxygen Saturation (Exercise) 96 %    Oxygen Saturation (Exit) 96 %    Rating of Perceived Exertion (Exercise) 13    Symptoms none    Duration Continue with 30 min of  aerobic exercise without signs/symptoms of physical distress.    Intensity THRR unchanged      Progression   Progression Continue to progress workloads to maintain intensity without signs/symptoms of physical distress.    Average METs 2.86      Resistance Training   Weight 7 lb    Reps 10-15      Interval Training   Interval Training No      Treadmill   MPH 1.7    Grade 3.5    Minutes 15    METs 3.12      T5 Nustep   Level 6    Minutes 15    METs 2.6      Home Exercise Plan   Plans to continue exercise at Home (comment)   TM, rower, walking   Frequency Add 2 additional days to program exercise sessions.    Initial Home Exercises Provided 11/27/23      Oxygen   Maintain Oxygen Saturation 88% or higher          Nutrition:  Target Goals: Understanding of nutrition guidelines, daily intake of sodium 1500mg , cholesterol 200mg , calories 30% from fat and 7% or less from saturated fats, daily to have 5 or more servings of fruits and vegetables.  Education: Nutrition 1 -Group instruction provided by verbal, written material, interactive activities, discussions, models, and posters to present general guidelines for heart healthy nutrition including macronutrients, label reading, and promoting whole foods over processed counterparts. Education serves as pensions consultant of discussion of heart healthy eating for all. Written material provided at class time. Flowsheet Row Cardiac Rehab from 12/04/2023 in [facility] Cardiac and Pulmonary Rehab  Date 11/27/23  Educator jg  Instruction Review Code 1- Verbalizes Understanding     Education: Nutrition 2 -Group instruction provided by verbal, written material,  interactive activities, discussions, models, and posters to present general guidelines for heart healthy nutrition including sodium, cholesterol, and saturated fat. Providing guidance of habit forming to improve blood pressure, cholesterol, and body weight. Written material provided at class time. Flowsheet Row Cardiac Rehab from 12/04/2023 in [facility] Cardiac and Pulmonary Rehab  Date 12/04/23  Educator jg  Instruction Review Code 1- Verbalizes Understanding      Biometrics:  Pre Biometrics - 09/17/23 1516       Pre Biometrics   Height 5' 10.1 (1.781 m)    Weight 208 lb 1.6 oz (94.4 kg)    Waist Circumference 43.5 inches    Hip Circumference 42.5 inches    Waist to Hip Ratio 1.02 %    BMI (Calculated) 29.76    Single Leg Stand 3.6 seconds          Post Biometrics - 02/10/24 1413        Post  Biometrics   Height 5' 10.1 (1.781 m)    Weight 215 lb 1.6 oz (97.6 kg)    Waist Circumference 46 inches    Hip Circumference 41.5 inches    Waist to Hip Ratio 1.11 %    BMI (Calculated) 30.76    Single Leg Stand 5.5 seconds          Nutrition Therapy Plan and Nutrition Goals:   Nutrition Assessments:  MEDIFICTS Score Key: >=70 Need to make dietary changes  40-70 Heart Healthy Diet <= 40 Therapeutic Level Cholesterol Diet  Flowsheet Row Cardiac Rehab from 02/12/2024 in [facility] Cardiac and Pulmonary Rehab  Picture Your Plate Total Score on Admission 56  Picture Your Plate Total Score on Discharge 58   Picture  Your Plate Scores: <59 Unhealthy dietary pattern with much room for improvement. 41-50 Dietary pattern unlikely to meet recommendations for good health and room for improvement. 51-60 More healthful dietary pattern, with some room for improvement.  >60 Healthy dietary pattern, although there may be some specific behaviors that could be improved.    Nutrition Goals Re-Evaluation:  Nutrition Goals Re-Evaluation     Row Name 10/28/23 1510 12/04/23 1357 01/13/24 1402          Goals   Nutrition Goal RD appointment deferred at this time RD appointment deferred at this time Pt continues to defer a meeting with the RD at this time.        Nutrition Goals Discharge (Final Nutrition Goals Re-Evaluation):  Nutrition Goals Re-Evaluation - 01/13/24 1402       Goals   Nutrition Goal Pt continues to defer a meeting with the RD at this time.          Psychosocial: Target Goals: Acknowledge presence or absence of significant depression and/or stress, maximize coping skills, provide positive support system. Participant is able to verbalize types and ability to use techniques and skills needed for reducing stress and depression.   Education: Stress, Anxiety, and Depression - Group verbal and visual presentation to define topics covered.  Reviews how body is impacted by stress, anxiety, and depression.  Also discusses healthy ways to reduce stress and to treat/manage anxiety and depression. Written material provided at class time.   Education: Sleep Hygiene -Provides group verbal and written instruction about how sleep can affect your health.  Define sleep hygiene, discuss sleep cycles and impact of sleep habits. Review good sleep hygiene tips.   Initial Review & Psychosocial Screening:  Initial Psych Review & Screening - 09/06/23 1015       Initial Review   Current issues with None Identified      Family Dynamics   Good Support System? Yes    Comments He has two daughters, a son and a girlfriend for support. He states no mental instability.      Barriers   Psychosocial barriers to participate in program There are no identifiable barriers or psychosocial needs.;The patient should benefit from training in stress management and relaxation.      Screening Interventions   Interventions Encouraged to exercise;To provide support and resources with identified psychosocial needs;Provide feedback about the scores to participant    Expected Outcomes Short Term  goal: Utilizing psychosocial counselor, staff and physician to assist with identification of specific Stressors or current issues interfering with healing process. Setting desired goal for each stressor or current issue identified.;Long Term Goal: Stressors or current issues are controlled or eliminated.;Short Term goal: Identification and review with participant of any Quality of Life or Depression concerns found by scoring the questionnaire.;Long Term goal: The participant improves quality of Life and PHQ9 Scores as seen by post scores and/or verbalization of changes          Quality of Life Scores:   Quality of Life - 02/12/24 1454       Quality of Life Scores   Health/Function Pre 11 %    Health/Function Post 22.43 %    Health/Function % Change 103.91 %    Socioeconomic Pre 24.75 %    Socioeconomic Post 27.75 %    Socioeconomic % Change  12.12 %    Psych/Spiritual Pre 13.71 %    Psych/Spiritual Post 27.43 %    Psych/Spiritual % Change 100.07 %    Family Pre  26.4 %    Family Post 28.8 %    Family % Change 9.09 %    GLOBAL Pre 16.89 %    GLOBAL Post 25.56 %    GLOBAL % Change 51.33 %         Scores of 19 and below usually indicate a poorer quality of life in these areas.  A difference of  2-3 points is a clinically meaningful difference.  A difference of 2-3 points in the total score of the Quality of Life Index has been associated with significant improvement in overall quality of life, self-image, physical symptoms, and general health in studies assessing change in quality of life.  PHQ-9: Review Flowsheet       02/12/2024 10/28/2023 09/17/2023  Depression screen PHQ 2/9  Decreased Interest 0 0 1  Down, Depressed, Hopeless 0 0 1  PHQ - 2 Score 0 0 2  Altered sleeping 1 1 1   Tired, decreased energy 3 2 3   Change in appetite 2 0 1  Feeling bad or failure about yourself  0 0 2  Trouble concentrating 0 0 0  Moving slowly or fidgety/restless 0 0 0  Suicidal thoughts 0 0 0   PHQ-9 Score 6 3  9    Difficult doing work/chores Not difficult at all Not difficult at all Very difficult    Details       Data saved with a previous flowsheet row definition        Interpretation of Total Score  Total Score Depression Severity:  1-4 = Minimal depression, 5-9 = Mild depression, 10-14 = Moderate depression, 15-19 = Moderately severe depression, 20-27 = Severe depression   Psychosocial Evaluation and Intervention:  Psychosocial Evaluation - 09/06/23 1017       Psychosocial Evaluation & Interventions   Interventions Encouraged to exercise with the program and follow exercise prescription;Relaxation education;Stress management education    Comments He has two daughters, a son and a girlfriend for support. He states no mental instability.    Expected Outcomes Short: Start [facility] to help with mood. Long: Maintain a healthy mental state    Continue Psychosocial Services  Follow up required by staff          Psychosocial Re-Evaluation:  Psychosocial Re-Evaluation     Row Name 10/28/23 1507 12/04/23 1408 01/13/24 1403 02/17/24 1410       Psychosocial Re-Evaluation   Current issues with None Identified Current Sleep Concerns Current Sleep Concerns;Current Stress Concerns Current Sleep Concerns    Comments Travanti states he does not have any current stressors. He states he does not sleep great because he gets up to go to the bathroom 2-3 times a night, but does sleep soundly. He states he has a strong support system of his girlfriend, 2 daughters, and son that he can easily look to for support. He states he manages his stressors well and often will turn everything off and get out his bible and read. His PHQ score was reassessed today and it went down to 3. Tejon states that he continues to have a strong support system and feels blessed with his family and friends. He reports he is still not sleeping well and sometimes has low energy during the day. He was encouraged  to talk to his doctor if this continues to be a concerns. Reports no concerns with stress, or mental health. Marsh states that he has had some stress lately due to some friends passing away. He has been going to some christmas parties  lately which he has enjoyed, but also has made him tired. He reports not sleeping well due to waking up throughout the night to use the restroom. He talked to his doctor about this issue and they instructed him not to drink too many fluids after dinner. He overall is doing well with his mental health. Fount denies any anxiety, stress or depression at this time. He says he has been busy with holidays and is tried. But doesnt sleep much late at night, instead often wakes up early in morning then falls back asleep. says that where he gets his best sleep.    Expected Outcomes Short: Continue to manage stressors that arise. Long: Continue to maintain a positive outlook on life. Short: continue to attend cardiac rehab for mental health benefits of exercise. Talk to doctor about sleep concerns.  Long: maintain good mental health routine. Short: Drink less after dinner to help with sleep concerns as instructed by the doctor.  Long: Maintain positive outlook. STG: Focus on good sleep hygiene and exercise for stress relief. LTG: Maintain positive outlook    Interventions Encouraged to attend Cardiac Rehabilitation for the exercise Encouraged to attend Cardiac Rehabilitation for the exercise Encouraged to attend Cardiac Rehabilitation for the exercise Encouraged to attend Cardiac Rehabilitation for the exercise    Continue Psychosocial Services  Follow up required by staff Follow up required by staff Follow up required by staff Follow up required by staff       Psychosocial Discharge (Final Psychosocial Re-Evaluation):  Psychosocial Re-Evaluation - 02/17/24 1410       Psychosocial Re-Evaluation   Current issues with Current Sleep Concerns    Comments Zechariah denies any anxiety,  stress or depression at this time. He says he has been busy with holidays and is tried. But doesnt sleep much late at night, instead often wakes up early in morning then falls back asleep. says that where he gets his best sleep.    Expected Outcomes STG: Focus on good sleep hygiene and exercise for stress relief. LTG: Maintain positive outlook    Interventions Encouraged to attend Cardiac Rehabilitation for the exercise    Continue Psychosocial Services  Follow up required by staff          Vocational Rehabilitation: Provide vocational rehab assistance to qualifying candidates.   Vocational Rehab Evaluation & Intervention:   Education: Education Goals: Education classes will be provided on a variety of topics geared toward better understanding of heart health and risk factor modification. Participant will state understanding/return demonstration of topics presented as noted by education test scores.  Learning Barriers/Preferences:  Learning Barriers/Preferences - 09/06/23 1014       Learning Barriers/Preferences   Learning Barriers None    Learning Preferences None          General Cardiac Education Topics:  AED/CPR: - Group verbal and written instruction with the use of models to demonstrate the basic use of the AED with the basic ABC's of resuscitation.   Test and Procedures: - Group verbal and visual presentation and models provide information about basic cardiac anatomy and function. Reviews the testing methods done to diagnose heart disease and the outcomes of the test results. Describes the treatment choices: Medical Management, Angioplasty, or Coronary Bypass Surgery for treating various heart conditions including Myocardial Infarction, Angina, Valve Disease, and Cardiac Arrhythmias. Written material provided at class time.   Medication Safety: - Group verbal and visual instruction to review commonly prescribed medications for heart and lung disease. Reviews the  medication, class of the drug, and side effects. Includes the steps to properly store meds and maintain the prescription regimen. Written material provided at class time.   Intimacy: - Group verbal instruction through game format to discuss how heart and lung disease can affect sexual intimacy. Written material provided at class time. Flowsheet Row Cardiac Rehab from 12/04/2023 in [facility] Cardiac and Pulmonary Rehab  Date 11/20/23  Educator mb  Instruction Review Code 1- Verbalizes Understanding    Know Your Numbers and Heart Failure: - Group verbal and visual instruction to discuss disease risk factors for cardiac and pulmonary disease and treatment options.  Reviews associated critical values for Overweight/Obesity, Hypertension, Cholesterol, and Diabetes.  Discusses basics of heart failure: signs/symptoms and treatments.  Introduces Heart Failure Zone chart for action plan for heart failure. Written material provided at class time. Flowsheet Row Cardiac Rehab from 12/04/2023 in [facility] Cardiac and Pulmonary Rehab  Date 10/16/23  Educator mc  Instruction Review Code 1- Verbalizes Understanding    Infection Prevention: - Provides verbal and written material to individual with discussion of infection control including proper hand washing and proper equipment cleaning during exercise session. Flowsheet Row Cardiac Rehab from 12/04/2023 in [facility] Cardiac and Pulmonary Rehab  Date 09/17/23  Educator [facility]  Instruction Review Code 1- Verbalizes Understanding    Falls Prevention: - Provides verbal and written material to individual with discussion of falls prevention and safety. Flowsheet Row Cardiac Rehab from 12/04/2023 in [facility] Cardiac and Pulmonary Rehab  Date 09/17/23  Educator [facility]  Instruction Review Code 1- Verbalizes Understanding    Other: -Provides group and verbal instruction on various topics (see comments)   Knowledge Questionnaire Score:  Knowledge Questionnaire Score - 02/12/24  1454       Knowledge Questionnaire Score   Pre Score 26    Post Score 25/26          Core Components/Risk Factors/Patient Goals at Admission:  Personal Goals and Risk Factors at Admission - 09/06/23 1014       Core Components/Risk Factors/Patient Goals on Admission    Weight Management Yes;Weight Loss    Intervention Weight Management: Develop a combined nutrition and exercise program designed to reach desired caloric intake, while maintaining appropriate intake of nutrient and fiber, sodium and fats, and appropriate energy expenditure required for the weight goal.;Weight Management: Provide education and appropriate resources to help participant work on and attain dietary goals.;Weight Management/Obesity: Establish reasonable short term and long term weight goals.    Expected Outcomes Short Term: Continue to assess and modify interventions until short term weight is achieved;Long Term: Adherence to nutrition and physical activity/exercise program aimed toward attainment of established weight goal;Weight Loss: Understanding of general recommendations for a balanced deficit meal plan, which promotes 1-2 lb weight loss per week and includes a negative energy balance of [phone] kcal/d;Understanding recommendations for meals to include 15-35% energy as protein, 25-35% energy from fat, 35-60% energy from carbohydrates, less than 200mg  of dietary cholesterol, 20-35 gm of total fiber daily;Understanding of distribution of calorie intake throughout the day with the consumption of 4-5 meals/snacks    Hypertension Yes    Intervention Provide education on lifestyle modifcations including regular physical activity/exercise, weight management, moderate sodium restriction and increased consumption of fresh fruit, vegetables, and low fat dairy, alcohol  moderation, and smoking cessation.;Monitor prescription use compliance.    Expected Outcomes Short Term: Continued assessment and intervention until BP is <  140/47mm HG in hypertensive participants. < 130/16mm HG in hypertensive participants with diabetes,  heart failure or chronic kidney disease.;Long Term: Maintenance of blood pressure at goal levels.    Lipids Yes    Intervention Provide education and support for participant on nutrition & aerobic/resistive exercise along with prescribed medications to achieve LDL 70mg , HDL >40mg .    Expected Outcomes Short Term: Participant states understanding of desired cholesterol values and is compliant with medications prescribed. Participant is following exercise prescription and nutrition guidelines.;Long Term: Cholesterol controlled with medications as prescribed, with individualized exercise RX and with personalized nutrition plan. Value goals: LDL < 70mg , HDL > 40 mg.          Education:Diabetes - Individual verbal and written instruction to review signs/symptoms of diabetes, desired ranges of glucose level fasting, after meals and with exercise. Acknowledge that pre and post exercise glucose checks will be done for 3 sessions at entry of program.   Core Components/Risk Factors/Patient Goals Review:   Goals and Risk Factor Review     Row Name 10/28/23 1510 12/04/23 1405 01/13/24 1408 02/17/24 1412       Core Components/Risk Factors/Patient Goals Review   Personal Goals Review Weight Management/Obesity;Hypertension Weight Management/Obesity;Lipids;Hypertension Weight Management/Obesity;Lipids;Hypertension Weight Management/Obesity;Hypertension    Review Anirudh states he is monitoring his blood pressure at home with his wrist monitor and is getting similar readings as he does in rehab. He states he is checking his weight daily and has a goal to get down to 190lb. He knows it will take discipline and be hard. He has switched to drinking water and will occasionally have a soft drink. He also started to split meals with his girlfriend when they go out to help with portion size. Cavon reports that he  checks his weight and blood pressure at home. He reports that his weight is up and down slightly but mostly stays steady. She reports that he takes all her BP and cholesterol meds and follows up with his docotor for appointments and lab work. He wants to continue to work on weight loss with a goal of 190 lbs. Frazer states that he would like to lose a little more weight with a goal of getting under 200 lb. He states that when he weighs at home with no clothes that he is around 203 lb and is getting closer to his goal. He owns a BP cuff at home and has been checking his BP only when he feels that he needs to check it. He also continues to take all of his medications as prescribed. Vincient reports he is weighing himself daily with readings showing between 205-210lbs. He wants to get under 200lbs. Spoke with him about heart healthy eating. He says he has not been checking his BP at home. He has cuff, encouraged him to keep up the habit of checking regularly.    Expected Outcomes Short: Cotinue monitoring weight and blood pressure at home. Long: Continue managing cardiovascular risk factors. Short: contine to work towards goal weight of 190 lbs. Continue to check BP at home. Long: control cardiac risk factors. Short: Contine to work towards weight goal. Long: Continue to manage lifestyle risk factors. STG: Get back into habit of checking BP at home. LTG: Continue to manage lifestyle risk factors.       Core Components/Risk Factors/Patient Goals at Discharge (Final Review):   Goals and Risk Factor Review - 02/17/24 1412       Core Components/Risk Factors/Patient Goals Review   Personal Goals Review Weight Management/Obesity;Hypertension    Review Thayden reports he is weighing himself daily  with readings showing between 205-210lbs. He wants to get under 200lbs. Spoke with him about heart healthy eating. He says he has not been checking his BP at home. He has cuff, encouraged him to keep up the habit of  checking regularly.    Expected Outcomes STG: Get back into habit of checking BP at home. LTG: Continue to manage lifestyle risk factors.          ITP Comments:  ITP Comments     Row Name 09/06/23 1012 09/17/23 1510 09/23/23 1406 10/02/23 0955 10/30/23 1439   ITP Comments Virtual Visit completed. Patient informed on EP and RD appointment and 6 Minute walk test. Patient also informed of patient health questionnaires on My Chart. Patient Verbalizes understanding. Visit diagnosis can be found in [facility] 4//29/2025. Completed and gym orientation for cardiac rehab. Initial ITP created and sent for review to Dr. Oneil Pinal, Medical Director. First full day of exercise!  Patient was oriented to gym and equipment including functions, settings, policies, and procedures.  Patient's individual exercise prescription and treatment plan were reviewed.  All starting workloads were established based on the results of the 6 minute walk test done at initial orientation visit.  The plan for exercise progression was also introduced and progression will be customized based on patient's performance and goals. 30 Day review completed. Medical Director ITP review done; changes made as directed and signed approval by Medical Director. New to program. 30 Day review completed. Medical Director ITP review done; changes made as directed and signed approval by Medical Director.    Row Name 11/27/23 0946 12/25/23 1257 01/22/24 0927 02/19/24 1059 02/24/24 1350   ITP Comments 30 Day review completed. Medical Director ITP review done; changes made as directed and signed approval by Medical Director. 30 Day review completed. Medical Director ITP review done, changes made as directed, and signed approval by Medical Director. 30 Day review completed. Medical Director ITP review done, changes made as directed, and signed approval by Medical Director. 30 Day review completed. 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  Details of the patient's exercise prescription and what He needs to do in order to continue the prescription and progress were discussed with patient.  Patient was given a copy of prescription and goals.  Patient verbalized understanding. Mounir plans to continue to exercise by walking at home and using the treadmill and rower.      Comments: Discharge ITP    [1]  Current Outpatient Medications:    aspirin  81 MG chewable tablet, Chew 81 mg by mouth. (Patient not taking: Reported on 09/06/2023), Disp: , Rfl:    aspirin  81 MG chewable tablet, Chew 81 mg by mouth., Disp: , Rfl:    carvedilol (COREG) 6.25 MG tablet, Take 6.25 mg by mouth., Disp: , Rfl:    clopidogrel (PLAVIX) 75 MG tablet, Take 75 mg by mouth., Disp: , Rfl:    furosemide  (LASIX ) 20 MG tablet, Take 20 mg by mouth daily. (Patient not taking: Reported on 09/06/2023), Disp: , Rfl:    furosemide  (LASIX ) 40 MG tablet, Take 40 mg by mouth daily. (Patient not taking: Reported on 09/06/2023), Disp: , Rfl:    Multiple Vitamins-Minerals (CENTRUM SILVER 50+MEN PO), Take by mouth., Disp: , Rfl:    nitroGLYCERIN  (NITROSTAT ) 0.4 MG SL tablet, Place 0.4 mg under the tongue every 5 (five) minutes as needed for chest pain., Disp: ,  Rfl:    nitroGLYCERIN  (NITROSTAT ) 0.4 MG SL tablet, Place 0.4 mg under the tongue. (Patient not taking: Reported on 09/06/2023), Disp: , Rfl:    omeprazole (PRILOSEC) 20 MG capsule, Take 20 mg by mouth daily. (Patient not taking: Reported on 09/06/2023), Disp: , Rfl:    pantoprazole (PROTONIX) 40 MG tablet, Take 40 mg by mouth., Disp: , Rfl:    rosuvastatin (CRESTOR) 20 MG tablet, Take 1 tablet by mouth at bedtime., Disp: , Rfl:    sacubitril-valsartan (ENTRESTO) 24-26 MG, Take 1 tablet by mouth every 12 (twelve) hours., Disp: , Rfl:    spironolactone (ALDACTONE) 25 MG tablet, Take 12.5 mg by mouth., Disp: , Rfl:    ticagrelor  (BRILINTA) 90 MG TABS tablet, Take 90 mg by mouth 2 (two) times daily. (Patient not taking: Reported on 09/06/2023), Disp: , Rfl:  [2]  Social History Tobacco Use  Smoking Status Never  Smokeless Tobacco Current   Types: Snuff

## 2024-02-24 NOTE — Progress Notes (Signed)
 Daily Session Note  Patient Details  Name: Theodore Wiggins MRN: [id number] Date of Birth: 11-22-1945 Referring Provider:   Flowsheet Row Cardiac Rehab from 09/17/2023 in [facility]  Referring Provider Dr. Keller Paterson    Encounter Date: 02/24/2024  Check In:  Session Check In - 02/24/24 1347       Check-In   Supervising physician immediately available to respond to emergencies See telemetry face sheet for immediately available ER MD    Location [facility]-Cardiac & Pulmonary [facility]    Staff Present Maxon Conetta BS, Exercise Physiologist;Margaret Best, MS, Exercise Physiologist;Noah Tickle, BS, Exercise Physiologist;Tocara Mennen RN,BSN    Virtual Visit No    Medication changes reported     No    Fall or balance concerns reported    No    Tobacco Cessation No Change    Warm-up and Cool-down Performed on first and last piece of equipment    Resistance Training Performed Yes    VAD Patient? No    PAD/SET Patient? No      Pain Assessment   Currently in Pain? No/denies             Tobacco Use History[1]  Goals Met:  Independence with exercise equipment Exercise tolerated well Personal goals reviewed No report of concerns or symptoms today Strength training completed today  Goals Unmet:  Not Applicable  Comments:  Theodore Wiggins graduated today from  rehab with 36 sessions completed.  Details of the patient's exercise prescription and what He needs to do in order to continue the prescription and progress were discussed with patient.  Patient was given a copy of prescription and goals.  Patient verbalized understanding. Theodore Wiggins plans to continue to exercise by walking at home and using the treadmill and rower.    Dr. Oneil Pinal is Medical Director for [facility].  Dr. Fuad Aleskerov is Medical Director for [facility] Pulmonary [facility].     [1]  Social History Tobacco Use  Smoking Status Never  Smokeless Tobacco Current    Types: Snuff

## 2024-02-24 NOTE — Progress Notes (Signed)
 Discharge Summary   [name]  DOB: 11-03-1945   [name] graduated today from  rehab with 36 sessions completed.  Details of the patient's exercise prescription and what He needs to do in order to continue the prescription and progress were discussed with patient.  Patient was given a copy of prescription and goals.  Patient verbalized understanding. [name] plans to continue to exercise by walking at home and using the treadmill and rower.   6 Minute Walk     Row Name 09/17/23 1511 02/10/24 1411       6 Minute Walk   Phase Initial Discharge    Distance 920 feet 1145 feet    Distance % Change -- 24.5 %    Distance Feet Change -- 225 ft    Walk Time 6 minutes 6 minutes    # of Rest Breaks 0 0    MPH 1.74 2.17    METS 1.5 2.22    RPE 13 13    Perceived Dyspnea  2.5 1    VO2 Peak 5.25 7.77    Symptoms Yes (comment) Yes (comment)    Comments SOB used cane    Resting HR 62 bpm 79 bpm    Resting BP 122/70 134/82    Resting Oxygen Saturation  97 % 97 %    Exercise Oxygen Saturation  during 6 min walk 100 % 95 %    Max Ex. HR 75 bpm 94 bpm    Max Ex. BP 138/64 166/88    2 Minute Post BP 116/66 --
